# Patient Record
Sex: Female | Born: 1940
Health system: Southern US, Community
[De-identification: ages and names within clinical notes are randomized; demographics above are authoritative.]

## PROBLEM LIST (undated history)

## (undated) DIAGNOSIS — R4189 Other symptoms and signs involving cognitive functions and awareness: Secondary | ICD-10-CM

## (undated) DIAGNOSIS — F419 Anxiety disorder, unspecified: Secondary | ICD-10-CM

## (undated) DIAGNOSIS — Z720 Tobacco use: Secondary | ICD-10-CM

## (undated) DIAGNOSIS — K219 Gastro-esophageal reflux disease without esophagitis: Secondary | ICD-10-CM

## (undated) DIAGNOSIS — E039 Hypothyroidism, unspecified: Secondary | ICD-10-CM

## (undated) DIAGNOSIS — E785 Hyperlipidemia, unspecified: Secondary | ICD-10-CM

## (undated) DIAGNOSIS — I639 Cerebral infarction, unspecified: Secondary | ICD-10-CM

## (undated) DIAGNOSIS — R51 Headache: Secondary | ICD-10-CM

## (undated) DIAGNOSIS — IMO0002 Reserved for concepts with insufficient information to code with codable children: Secondary | ICD-10-CM

## (undated) DIAGNOSIS — M47816 Spondylosis without myelopathy or radiculopathy, lumbar region: Secondary | ICD-10-CM

## (undated) DIAGNOSIS — M810 Age-related osteoporosis without current pathological fracture: Secondary | ICD-10-CM

## (undated) DIAGNOSIS — I10 Essential (primary) hypertension: Secondary | ICD-10-CM

## (undated) DIAGNOSIS — J449 Chronic obstructive pulmonary disease, unspecified: Secondary | ICD-10-CM

## (undated) DIAGNOSIS — K2970 Gastritis, unspecified, without bleeding: Secondary | ICD-10-CM

## (undated) DIAGNOSIS — M797 Fibromyalgia: Secondary | ICD-10-CM

## (undated) HISTORY — PX: KNEE SURGERY: SHX244

## (undated) HISTORY — DX: Headache: R51

## (undated) HISTORY — DX: Age-related osteoporosis without current pathological fracture: M81.0

## (undated) HISTORY — DX: Fibromyalgia: M79.7

## (undated) HISTORY — DX: Hyperlipidemia, unspecified: E78.5

## (undated) HISTORY — DX: Cerebral infarction, unspecified: I63.9

## (undated) HISTORY — DX: Gastritis, unspecified, without bleeding: K29.70

## (undated) HISTORY — DX: Chronic obstructive pulmonary disease, unspecified: J44.9

## (undated) HISTORY — PX: GASTRIC BYPASS: SHX52

## (undated) HISTORY — PX: CHOLECYSTECTOMY: SHX55

---

## 2001-01-10 ENCOUNTER — Encounter: Payer: Self-pay | Admitting: Family Medicine

## 2001-01-10 ENCOUNTER — Ambulatory Visit (HOSPITAL_COMMUNITY): Admission: RE | Admit: 2001-01-10 | Discharge: 2001-01-10 | Payer: Self-pay | Admitting: Family Medicine

## 2001-11-27 ENCOUNTER — Encounter: Payer: Self-pay | Admitting: Orthopaedic Surgery

## 2001-11-27 ENCOUNTER — Ambulatory Visit (HOSPITAL_COMMUNITY): Admission: RE | Admit: 2001-11-27 | Discharge: 2001-11-27 | Payer: Self-pay | Admitting: Orthopaedic Surgery

## 2001-12-04 ENCOUNTER — Ambulatory Visit (HOSPITAL_COMMUNITY): Admission: RE | Admit: 2001-12-04 | Discharge: 2001-12-04 | Payer: Self-pay | Admitting: Orthopaedic Surgery

## 2002-01-20 ENCOUNTER — Ambulatory Visit (HOSPITAL_COMMUNITY): Admission: RE | Admit: 2002-01-20 | Discharge: 2002-01-20 | Payer: Self-pay | Admitting: Internal Medicine

## 2004-01-28 ENCOUNTER — Ambulatory Visit (HOSPITAL_COMMUNITY): Admission: RE | Admit: 2004-01-28 | Discharge: 2004-01-28 | Payer: Self-pay | Admitting: Family Medicine

## 2004-02-25 ENCOUNTER — Ambulatory Visit (HOSPITAL_COMMUNITY): Admission: RE | Admit: 2004-02-25 | Discharge: 2004-02-25 | Payer: Self-pay | Admitting: Family Medicine

## 2004-05-14 HISTORY — PX: COLONOSCOPY WITH ESOPHAGOGASTRODUODENOSCOPY (EGD): SHX5779

## 2004-09-01 ENCOUNTER — Ambulatory Visit (HOSPITAL_COMMUNITY): Admission: RE | Admit: 2004-09-01 | Discharge: 2004-09-01 | Payer: Self-pay | Admitting: Family Medicine

## 2004-09-15 ENCOUNTER — Ambulatory Visit (HOSPITAL_COMMUNITY): Admission: RE | Admit: 2004-09-15 | Discharge: 2004-09-15 | Payer: Self-pay | Admitting: Family Medicine

## 2004-10-18 ENCOUNTER — Ambulatory Visit: Payer: Self-pay | Admitting: Internal Medicine

## 2004-11-16 ENCOUNTER — Ambulatory Visit: Payer: Self-pay | Admitting: Internal Medicine

## 2004-11-16 ENCOUNTER — Ambulatory Visit (HOSPITAL_COMMUNITY): Admission: RE | Admit: 2004-11-16 | Discharge: 2004-11-16 | Payer: Self-pay | Admitting: Internal Medicine

## 2005-01-29 ENCOUNTER — Ambulatory Visit (HOSPITAL_COMMUNITY): Admission: RE | Admit: 2005-01-29 | Discharge: 2005-01-29 | Payer: Self-pay | Admitting: Family Medicine

## 2005-11-23 ENCOUNTER — Emergency Department (HOSPITAL_COMMUNITY): Admission: EM | Admit: 2005-11-23 | Discharge: 2005-11-23 | Payer: Self-pay | Admitting: Emergency Medicine

## 2006-01-04 ENCOUNTER — Ambulatory Visit (HOSPITAL_COMMUNITY): Admission: RE | Admit: 2006-01-04 | Discharge: 2006-01-04 | Payer: Self-pay | Admitting: Orthopaedic Surgery

## 2006-01-18 ENCOUNTER — Ambulatory Visit (HOSPITAL_COMMUNITY): Admission: RE | Admit: 2006-01-18 | Discharge: 2006-01-18 | Payer: Self-pay | Admitting: Family Medicine

## 2006-11-18 ENCOUNTER — Ambulatory Visit (HOSPITAL_COMMUNITY): Admission: RE | Admit: 2006-11-18 | Discharge: 2006-11-18 | Payer: Self-pay | Admitting: Family Medicine

## 2007-10-17 ENCOUNTER — Ambulatory Visit (HOSPITAL_COMMUNITY): Admission: RE | Admit: 2007-10-17 | Discharge: 2007-10-17 | Payer: Self-pay | Admitting: Family Medicine

## 2007-10-21 ENCOUNTER — Ambulatory Visit (HOSPITAL_COMMUNITY): Admission: RE | Admit: 2007-10-21 | Discharge: 2007-10-21 | Payer: Self-pay | Admitting: Family Medicine

## 2007-11-11 ENCOUNTER — Emergency Department (HOSPITAL_COMMUNITY): Admission: EM | Admit: 2007-11-11 | Discharge: 2007-11-11 | Payer: Self-pay | Admitting: Emergency Medicine

## 2007-11-17 ENCOUNTER — Observation Stay (HOSPITAL_COMMUNITY): Admission: AD | Admit: 2007-11-17 | Discharge: 2007-11-19 | Payer: Self-pay | Admitting: Family Medicine

## 2008-04-23 ENCOUNTER — Ambulatory Visit (HOSPITAL_COMMUNITY): Admission: RE | Admit: 2008-04-23 | Discharge: 2008-04-23 | Payer: Self-pay | Admitting: Orthopedic Surgery

## 2008-04-29 ENCOUNTER — Inpatient Hospital Stay (HOSPITAL_COMMUNITY): Admission: EM | Admit: 2008-04-29 | Discharge: 2008-05-04 | Payer: Self-pay | Admitting: *Deleted

## 2008-10-28 ENCOUNTER — Inpatient Hospital Stay (HOSPITAL_COMMUNITY): Admission: RE | Admit: 2008-10-28 | Discharge: 2008-10-31 | Payer: Self-pay | Admitting: Orthopedic Surgery

## 2010-08-21 LAB — CBC
HCT: 27 % — ABNORMAL LOW (ref 36.0–46.0)
HCT: 29.3 % — ABNORMAL LOW (ref 36.0–46.0)
HCT: 35.3 % — ABNORMAL LOW (ref 36.0–46.0)
Hemoglobin: 11.6 g/dL — ABNORMAL LOW (ref 12.0–15.0)
MCHC: 33.4 g/dL (ref 30.0–36.0)
MCHC: 34.1 g/dL (ref 30.0–36.0)
MCHC: 32.9 g/dL (ref 30.0–36.0)
MCV: 94.5 fL (ref 78.0–100.0)
MCV: 95.4 fL (ref 78.0–100.0)
MCV: 96.6 fL (ref 78.0–100.0)
Platelets: 207 10*3/uL (ref 150–400)
RDW: 14.7 % (ref 11.5–15.5)
RDW: 15.8 % — ABNORMAL HIGH (ref 11.5–15.5)
RDW: 15.2 % (ref 11.5–15.5)
WBC: 9.3 10*3/uL (ref 4.0–10.5)

## 2010-08-21 LAB — CROSSMATCH: ABO/RH(D): A POS

## 2010-08-21 LAB — BASIC METABOLIC PANEL
BUN: 6 mg/dL (ref 6–23)
CO2: 26 mEq/L (ref 19–32)
CO2: 29 mEq/L (ref 19–32)
Calcium: 7.8 mg/dL — ABNORMAL LOW (ref 8.4–10.5)
Chloride: 107 mEq/L (ref 96–112)
Creatinine, Ser: 0.63 mg/dL (ref 0.4–1.2)
GFR calc Af Amer: >60 mL/min (ref >60)
GFR calc non Af Amer: >60 mL/min (ref >60)
GFR calc non Af Amer: >60 mL/min (ref >60)
Potassium: 4.4 mEq/L (ref 3.5–5.1)
Sodium: 134 mEq/L — ABNORMAL LOW (ref 135–145)

## 2010-08-21 LAB — URINALYSIS, ROUTINE W REFLEX MICROSCOPIC
Glucose, UA: NEGATIVE mg/dL
Ketones, ur: NEGATIVE mg/dL
Specific Gravity, Urine: 1.017 (ref 1.005–1.030)
pH: 5.5 (ref 5.0–8.0)

## 2010-08-21 LAB — COMPREHENSIVE METABOLIC PANEL
AST: 19 U/L (ref 0–37)
Albumin: 3.4 g/dL — ABNORMAL LOW (ref 3.5–5.2)
Chloride: 109 mEq/L (ref 96–112)
Creatinine, Ser: 0.76 mg/dL (ref 0.4–1.2)
GFR calc Af Amer: >60 mL/min (ref >60)
Potassium: 5.2 mEq/L — ABNORMAL HIGH (ref 3.5–5.1)
Total Bilirubin: 0.6 mg/dL (ref 0.3–1.2)

## 2010-08-21 LAB — DIFFERENTIAL
Basophils Absolute: 0.1 10*3/uL (ref 0.0–0.1)
Eosinophils Relative: 6 % — ABNORMAL HIGH (ref 0–5)
Lymphocytes Relative: 18 % (ref 12–46)
Monocytes Absolute: 0.5 10*3/uL (ref 0.1–1.0)

## 2010-08-21 LAB — URINE MICROSCOPIC-ADD ON

## 2010-08-22 ENCOUNTER — Other Ambulatory Visit (HOSPITAL_COMMUNITY): Payer: Self-pay | Admitting: Family Medicine

## 2010-08-22 DIAGNOSIS — Z139 Encounter for screening, unspecified: Secondary | ICD-10-CM

## 2010-08-29 ENCOUNTER — Ambulatory Visit (HOSPITAL_COMMUNITY)
Admission: RE | Admit: 2010-08-29 | Discharge: 2010-08-29 | Disposition: A | Discharge status: Home / Self Care | Payer: Medicare Other | Source: Ambulatory Visit | Attending: Family Medicine | Admitting: Family Medicine

## 2010-08-29 ENCOUNTER — Other Ambulatory Visit (HOSPITAL_COMMUNITY): Payer: Self-pay | Admitting: Family Medicine

## 2010-08-29 DIAGNOSIS — R05 Cough: Secondary | ICD-10-CM

## 2010-08-29 DIAGNOSIS — J449 Chronic obstructive pulmonary disease, unspecified: Secondary | ICD-10-CM

## 2010-08-29 DIAGNOSIS — Z139 Encounter for screening, unspecified: Secondary | ICD-10-CM

## 2010-08-29 DIAGNOSIS — Z1231 Encounter for screening mammogram for malignant neoplasm of breast: Secondary | ICD-10-CM | POA: Insufficient documentation

## 2010-08-29 DIAGNOSIS — R059 Cough, unspecified: Secondary | ICD-10-CM

## 2010-09-23 ENCOUNTER — Inpatient Hospital Stay (HOSPITAL_COMMUNITY): Payer: Medicare Other

## 2010-09-23 ENCOUNTER — Inpatient Hospital Stay (HOSPITAL_COMMUNITY)
Admission: AD | Admit: 2010-09-23 | Discharge: 2010-09-27 | DRG: 372 | Disposition: A | Discharge status: Home / Self Care | Payer: Medicare Other | Source: Ambulatory Visit | Attending: Internal Medicine | Admitting: Internal Medicine

## 2010-09-23 DIAGNOSIS — F411 Generalized anxiety disorder: Secondary | ICD-10-CM | POA: Diagnosis present

## 2010-09-23 DIAGNOSIS — A059 Bacterial foodborne intoxication, unspecified: Principal | ICD-10-CM | POA: Diagnosis present

## 2010-09-23 DIAGNOSIS — E871 Hypo-osmolality and hyponatremia: Secondary | ICD-10-CM | POA: Diagnosis not present

## 2010-09-23 DIAGNOSIS — E039 Hypothyroidism, unspecified: Secondary | ICD-10-CM | POA: Diagnosis present

## 2010-09-23 DIAGNOSIS — D509 Iron deficiency anemia, unspecified: Secondary | ICD-10-CM | POA: Diagnosis present

## 2010-09-23 DIAGNOSIS — IMO0001 Reserved for inherently not codable concepts without codable children: Secondary | ICD-10-CM | POA: Diagnosis present

## 2010-09-23 DIAGNOSIS — E876 Hypokalemia: Secondary | ICD-10-CM | POA: Diagnosis not present

## 2010-09-23 DIAGNOSIS — G894 Chronic pain syndrome: Secondary | ICD-10-CM | POA: Diagnosis present

## 2010-09-23 DIAGNOSIS — K219 Gastro-esophageal reflux disease without esophagitis: Secondary | ICD-10-CM | POA: Diagnosis present

## 2010-09-23 LAB — BASIC METABOLIC PANEL
Calcium: 8.8 mg/dL (ref 8.4–10.5)
Creatinine, Ser: 0.94 mg/dL (ref 0.4–1.2)
GFR calc Af Amer: >60 mL/min (ref >60)

## 2010-09-23 MED ORDER — IOHEXOL 300 MG/ML  SOLN: 100.0000 mL | Freq: Once | INTRAMUSCULAR | Status: AC | PRN

## 2010-09-24 LAB — CBC
Hemoglobin: 11 g/dL — ABNORMAL LOW (ref 12.0–15.0)
MCHC: 33.1 g/dL (ref 30.0–36.0)
Platelets: 299 10*3/uL (ref 150–400)
RBC: 3.56 MIL/uL — ABNORMAL LOW (ref 3.87–5.11)

## 2010-09-24 LAB — DIFFERENTIAL
Basophils Absolute: 0 10*3/uL (ref 0.0–0.1)
Basophils Relative: 0 % (ref 0–1)
Eosinophils Absolute: 0.1 10*3/uL (ref 0.0–0.7)
Neutro Abs: 7.2 10*3/uL (ref 1.7–7.7)
Neutrophils Relative %: 69 % (ref 43–77)

## 2010-09-24 LAB — COMPREHENSIVE METABOLIC PANEL
ALT: 17 U/L (ref 0–35)
AST: 35 U/L (ref 0–37)
Albumin: 2.5 g/dL — ABNORMAL LOW (ref 3.5–5.2)
CO2: 23 mEq/L (ref 19–32)
Calcium: 7.6 mg/dL — ABNORMAL LOW (ref 8.4–10.5)
Chloride: 103 mEq/L (ref 96–112)
GFR calc Af Amer: >60 mL/min (ref >60)
GFR calc non Af Amer: >60 mL/min (ref >60)
Sodium: 132 mEq/L — ABNORMAL LOW (ref 135–145)

## 2010-09-24 LAB — URINALYSIS, ROUTINE W REFLEX MICROSCOPIC
Glucose, UA: NEGATIVE mg/dL
Ketones, ur: NEGATIVE mg/dL
Nitrite: NEGATIVE
Specific Gravity, Urine: 1.01 (ref 1.005–1.030)
pH: 6 (ref 5.0–8.0)

## 2010-09-24 LAB — CLOSTRIDIUM DIFFICILE BY PCR: Toxigenic C. Difficile by PCR: NEGATIVE

## 2010-09-24 LAB — T4, FREE: Free T4: 1.02 ng/dL (ref 0.80–1.80)

## 2010-09-24 LAB — TSH: TSH: 0.792 u[IU]/mL (ref 0.350–4.500)

## 2010-09-24 MED ORDER — IOHEXOL 300 MG/ML  SOLN
100.0000 mL | Freq: Once | INTRAMUSCULAR | Status: AC | PRN
  Administered 2010-09-24: 100 mL via INTRAVENOUS

## 2010-09-25 LAB — BASIC METABOLIC PANEL
CO2: 24 mEq/L (ref 19–32)
Chloride: 109 mEq/L (ref 96–112)
Creatinine, Ser: <0.47 mg/dL (ref 0.4–1.2)
Potassium: 3.5 mEq/L (ref 3.5–5.1)
Sodium: 137 mEq/L (ref 135–145)

## 2010-09-25 LAB — DIFFERENTIAL
Basophils Absolute: 0 10*3/uL (ref 0.0–0.1)
Basophils Relative: 1 % (ref 0–1)
Eosinophils Absolute: 0.3 10*3/uL (ref 0.0–0.7)
Monocytes Relative: 12 % (ref 3–12)
Neutro Abs: 3.4 10*3/uL (ref 1.7–7.7)
Neutrophils Relative %: 54 % (ref 43–77)

## 2010-09-25 LAB — CBC
Hemoglobin: 10.7 g/dL — ABNORMAL LOW (ref 12.0–15.0)
MCH: 31.7 pg (ref 26.0–34.0)
Platelets: 230 10*3/uL (ref 150–400)
RBC: 3.38 MIL/uL — ABNORMAL LOW (ref 3.87–5.11)
WBC: 6.3 10*3/uL (ref 4.0–10.5)

## 2010-09-25 LAB — VITAMIN B12: Vitamin B-12: 616 pg/mL (ref 211–911)

## 2010-09-25 LAB — IRON: Iron: 33 ug/dL — ABNORMAL LOW (ref 42–135)

## 2010-09-25 LAB — FERRITIN: Ferritin: 48 ng/mL (ref 10–291)

## 2010-09-26 LAB — BASIC METABOLIC PANEL
BUN: <3 mg/dL — ABNORMAL LOW (ref 6–23)
Calcium: 8.3 mg/dL — ABNORMAL LOW (ref 8.4–10.5)
Creatinine, Ser: 0.48 mg/dL (ref 0.4–1.2)
GFR calc non Af Amer: >60 mL/min (ref >60)
Glucose, Bld: 88 mg/dL (ref 70–99)

## 2010-09-26 NOTE — H&P (Signed)
NAMEDIAMONIQUE, [name]            ACCOUNT NO.:  [id number]   MEDICAL RECORD NO.:  [id number]          PATIENT TYPE:  INP   LOCATION:  A332                          FACILITY:  [facility]   PHYSICIAN:  Donna Bernard, M.D.DATE OF BIRTH:  Aug 26, 1940   DATE OF ADMISSION:  11/17/2007  DATE OF DISCHARGE:  LH                              HISTORY & PHYSICAL   CHIEF COMPLAINT:  Cough, weakness, and light headedness.   SUBJECTIVE:  This patient is a 70 year old white female with history of  hypertension, hypothyroidism, hyperlipidemia, COPD, and arthritis who  presented to the office on day of admission with acute concerns.  The  patient was seen in the emergency room approximately a week before with  sudden onset of severe headache.  This occurred throughout the entire  head.  It was also accompanied over the next several days with  significant cough, congestion, chills, and fever.  The patient also had  vomiting and diarrhea.  She was seen here and given a prescription for  Levaquin, which she has been taking.  She has continued to have  diarrhea, and if anything, it has gotten worse.  Her cough has also  worsened.  She will have spells where she throws up with coughing, so  she has been having difficulty keeping anything down.  The patient notes  intermittent fever.  She had an x-ray in the ER, which showed no acute  changes.  The patient states generally her cough has been nonproductive.   MEDICATIONS:  She claims fair compliance with her chronic meds, which  include:  1. Paxil 40 mg daily.  2. Levothroid 0.125 mg daily.  3. Nortriptyline 50 mg p.o. nightly.  4. Lisinopril 10 mg p.o. q.a.m.  5. Ranitidine p.r.n.  6. Albuterol p.r.n.  7. Xanax 0.5 mg b.i.d. p.r.n. for anxiety.   ALLERGIES:  Macrobid and Neurontin.   FAMILY HISTORY:  Positive for hypertension, coronary artery disease, and  hyperlipidemia.   PRIOR SURGERIES:  1. Remote gastric bypass.  2. Remote cholecystectomy.  3.  Remote hysterectomy.  4. Oophorectomy.   SOCIAL HISTORY:  The patient is married.  Unfortunately, still smokes  despite having attempted to quit multiple times in the past.  Alcohol  intake none, 3 children.   REVIEW OF SYSTEMS:  Otherwise, negative.   PHYSICAL EXAMINATION:  VITAL SIGNS:  Currently, afebrile, blood pressure  130/78.  HEENT:  Mild nasal congestion.  TMs normal, no discharge.  Pharynx dry.  NECK:  Supple.  LUNGS:  Diminished breath sounds diffusely, rare wheeze.  HEART:  Slight tachycardic.  Positive for murmur.  ABDOMEN:  Significantly tender in the epigastrium.  Bowel sounds  hyperactive.  Mild left lower quadrant tenderness.  EXTREMITIES:  No  significant edema.  No focal neurological deficits.   Chest x-rays, plate atelectasis.  CBC, Met 7 pending.  It should be  noted blood pressure was 98/60 while lying down and 82/50 while sitting  up.   IMPRESSION:  1. Severe orthostatic hypotension and volume contraction.  2. Gastroenteritis.  3. Gastritis.  4. Possible sinusitis and bronchitis.  5. Post flu syndrome.  6.  Hypertension.  7. Chronic obstructive pulmonary disease.  8. Hyperlipidemia.   PLAN:  Hydrate, test further, further orders as noted in the chart.      Donna Bernard, M.D.  Electronically Signed     WSL/MEDQ  D:  11/17/2007  T:  11/18/2007  Job:  [id number]

## 2010-09-26 NOTE — Discharge Summary (Signed)
NAMEITA, FRITZSCHE            ACCOUNT NO.:  [id number]   MEDICAL RECORD NO.:  [id number]          PATIENT TYPE:  INP   LOCATION:  1615                         FACILITY:  [facility]   PHYSICIAN:  John L. Rendall, M.D.  DATE OF BIRTH:  1941-01-18   DATE OF ADMISSION:  04/29/2008  DATE OF DISCHARGE:  05/03/2008                               DISCHARGE SUMMARY   ADDENDUM   HOSPITAL COURSE:  Ms. Cottone was ready for transfer to a facility on  Friday, December 18, but a bed was not available, so she remained in the  hospital.  She had nonweightbearing of both left upper and lower  extremity with strict elevation.  She was started on Arixtra for DVT  prophylaxis.  Her pain was controlled with p.o. pain meds and it is felt  at this time she is ready for transfer to the skilled facility.      Legrand Pitts Duffy, P.A.      John L. Rendall, M.D.  Electronically Signed    KED/MEDQ  D:  05/03/2008  T:  05/03/2008  Job:  [id number]

## 2010-09-26 NOTE — Discharge Summary (Signed)
[name], [name]            ACCOUNT NO.:  [id number]   MEDICAL RECORD NO.:  [id number]          PATIENT TYPE:  OBV   LOCATION:  A322                          FACILITY:  [facility]   PHYSICIAN:  Scott A. Gerda Diss, MD    DATE OF BIRTH:  11/18/1940   DATE OF ADMISSION:  11/17/2007  DATE OF DISCHARGE:  LH                               DISCHARGE SUMMARY   DISCHARGE DIAGNOSES:  1. Sinusitis.  2. Gastroenteritis with orthostatic hypotension.  3. Diarrhea possibly antibiotic induced.   HOSPITAL COURSE:  A 70 year old female admitted in observation secondary  to abdominal pain, nausea, diarrhea, orthostasis, cough and congestion  who recently has been treated with Levaquin, still bringing up  discomfort and denies fever and chills.  She has had some vomiting and  diarrhea.  Because of the orthostasis, she was admitted for IV fluids,  IV Rocephin, and she gradually improved over the course in the next 36  hours, had a couple loose stools that were C-difficile toxin negative,  but the patient does relate that she had mucous in her stools over the  past few days before coming into the hospital.  After discussion with  the patient on the morning November 19, 2007, it is felt that she was  tolerating food well, able to get up, not having dizziness anymore,  urinating fine, and her labs overall looked good.  Her potassium had  come back up.  Her white count looks good.  It is felt that the patient  was stable for discharge.  She will be discharged home on a Z-PAK for  the sinus, but she will also be on Flagyl 500 t.i.d. by mouth to cover  for any possibility of C-difficile.  Also, lactobacilli over-the-counter  at pharmacy, take with meals and follow up with Dr. Brett Canales in a week's  time to see how things are going, may resume usual meds.  Should be  noted that thyroid was checked while she was in the hospital and TSH was  in the mid 3 range, should also be noted that she did have slight  hypokalemia  when she first came in but the potassium came up to 3.9 with  supplementation plus also be noted that she did have a slight anemia,  11.2 hemoglobin with 100.7 MCV.      Scott A. Gerda Diss, MD  Electronically Signed     SAL/MEDQ  D:  11/19/2007  T:  11/19/2007  Job:  [id number]   cc:   Donna Bernard, M.D.  Fax: [phone]

## 2010-09-26 NOTE — Discharge Summary (Signed)
NAMEZEYNAB, KLETT            ACCOUNT NO.:  [id number]   MEDICAL RECORD NO.:  [id number]          PATIENT TYPE:  INP   LOCATION:  1615                         [facility]ACILITY:  [facility]   PHYSICIAN:  John L. Rendall, M.D.  DATE OF BIRTH:  January 04, 1941   DATE OF ADMISSION:  04/29/2008  DATE OF DISCHARGE:  04/30/2008                               DISCHARGE SUMMARY   ADMISSION DIAGNOSES:  1. Nondisplaced left tib-fib fracture.  2. Impacted left distal radius fracture.  3. Osteoarthritis, left knee.  4. Depression.  5. Hypertension.  6. Fibromyalgia.  7. Chronic obstructive pulmonary disease/bronchitis.  8. Anxiety.  9. Hypothyroidism.  10.Gastroesophageal reflux disease.   DISCHARGE DIAGNOSES:  1. Nondisplaced left tib-fib fracture.  2. Impacted left distal radius fracture.  3. Osteoarthritis, left knee.  4. Depression.  5. Hypertension.  6. Fibromyalgia.  7. Chronic obstructive pulmonary disease/bronchitis.  8. Anxiety.  9. Hypothyroidism.  10.Gastroesophageal reflux disease.   1. Acute blood loss anemia secondary to fractures.   SURGICAL PROCEDURES:  None.   CONSULTANT:  Case management physical therapy consult April 29, 2008.   HISTORY OF PRESENT ILLNESS:  A 70 year old white female patient  presented to Dr. Priscille Kluver with a history of left knee arthritis.  She was  actually scheduled for admission on the morning of the 17th for a left  knee replacement, but while going to the bathroom the night before  surgery slipped on a wet spot on the floor and fell in the bathroom.  She was brought to the ER where she was found to have a nondisplaced  left tib-fib fracture and an impacted left distal radius fracture.  Because of this, her surgery was cancelled and she was admitted for the  need for skilled placement and pain control.   HOSPITAL COURSE:  Ms. Reaves was admitted via the ER.  She was placed  in a sugar-tong splint on the left wrist and a long-leg splint on the  left  leg.  She is to be nonweightbearing on both extremities with just  bed-to-chair transfers.  She was transferred to the orthopedic floor.  She had difficulty with swelling of the left arm and was placed in a  mission type splint, but she remained stable through the night.  It is  felt she is ready for transfer to a skilled facility when a bed is  available.   DISCHARGE INSTRUCTIONS:  Diet:  She can resume her regular diet.   MEDICATIONS:  She may resume her home meds.  These include  1. Diclofenac, she is to hold at this time.  2. Alprazolam 0.5 mg 1/2 tablet p.o. q.a.m. and 1-1/2 tablets p.o.      q.h.s.  3. Nortriptyline 25 mg 2 tablets p.o. q.h.s.  4. Pantoprazole 40 mg p.o. q.a.m.  5. Vicodin on hold at this time.  6. Ranitidine 300 mg p.o. q.a.m.  7. Levoxyl 125 mcg p.o. q.a.m.  8. Paroxetine 20 mg 2 tablets p.o. q.a.m.  9. Lisinopril 10 mg p.o. q.a.m.   Additional meds at this time include:  1. Percocet 5/325 mg 1-2 tablets p.o. q.4h. p.r.n. for pain.  2. Robaxin 500 mg 1-2 tablets p.o. q.6h. p.r.n. for spasms.  3. Arixtra 2.5 mg subcu q. 8 a.m. for 2 weeks.   ACTIVITY:  She needs to be nonweightbearing in the left arm in the left  leg, and she can be out of bed transferring bed to chair. They can  attempt ambulation but has to be completely nonweightbearing on the left  leg.  Left leg needs to be elevated above the heart as much as possible  for the next 48-72 hours. Ice pack.  Elevate the left wrist above the  heart as much as possible for the next 48-72 hours.   Wound care:  Please keep the splint clean and dry.  Apply ice pack to  the mid tibia and the wrist for the next 48-72 hours.  Please notify Dr.  Priscille Kluver of temperature greater than 101.5, chills, pain unrelieved by  pain meds or foul-smelling drainage from the wound.   FOLLOW-UP:  She needs to follow up with Dr. Priscille Kluver in our office in  [place] in approximately 1 week.  She needs to call [phone] to set up that   appointment, and that can be for the Monday after Christmas.   LABORATORY DATA:  X-ray taken of the left wrist on December 17 showed  impacted distal radius fracture.  X-rays taken of the left knee at that  time showed moderate joint space narrowing with osteophytic spurring and  bony eburnation with a transverse fracture through the upper tibial  shaft and a slightly impacted transverse fracture through the fibular  neck, small knee joint effusion.  Tib-fib film at that time showed tib-  fib fractures.   Hemoglobin and hematocrit ranged from 13.1 and 39.7 on the 11th to 11.5  and 34.5 on the 18th.  Glucose went from 77 on the 11th to 110 on the  18th.  Albumin was low at 3.4 on the 11th.   Urinalysis on December 11 showed trace leukocyte esterase, negative  nitrates, 0-2 white cells, negative protein.  All other indices within  normal limits.  All other laboratory studies were within normal limits.      Legrand Pitts Duffy, P.A.      John L. Rendall, M.D.  Electronically Signed    KED/MEDQ  D:  04/30/2008  T:  04/30/2008  Job:  [id number]

## 2010-09-26 NOTE — H&P (Signed)
NAME:  [name]            ACCOUNT NO.:  11/17/2007   MEDICAL RECORD NO.:  [id number]           PATIENT TYPE:   LOCATION:                                 FACILITY:   PHYSICIAN:  Donna Bernard, M.D.     DATE OF BIRTH:   DATE OF ADMISSION:  11/17/2007  DATE OF DISCHARGE:  LH                              HISTORY & PHYSICAL   CHIEF COMPLAINT:  Cough, weakness, and light headedness.   SUBJECTIVE:  This patient is a 70 year old white female with history of  hypertension, hypothyroidism, hyperlipidemia, COPD, and arthritis who  presented to the office on day of admission with acute concerns.  The  patient was seen in the emergency room approximately a week before with  sudden onset of severe headache.  This occurred throughout the entire  head.  It was also accompanied over the next several days with  significant cough, congestion, chills, and fever.  The patient also had  vomiting and diarrhea.  She was seen here and given a prescription for  Levaquin, which she has been taking.  She has continued to have  diarrhea, and if anything, it has gotten worse.  Her cough has also  worsened.  She will have spells where she throws up with coughing, so  she has been having difficulty keeping anything down.  The patient notes  intermittent fever.  She had an x-ray in the ER, which showed no acute  changes.  The patient states generally her cough has been nonproductive.   MEDICATIONS:  She claims fair compliance with her chronic meds, which  include:  1. Paxil 40 mg daily.  2. Levothroid 0.125 mg daily.  3. Nortriptyline 50 mg p.o. nightly.  4. Lisinopril 10 mg p.o. q.a.m.  5. Ranitidine p.r.n.  6. Albuterol p.r.n.  7. Xanax 0.5 mg b.i.d. p.r.n. for anxiety.   ALLERGIES:  Macrobid and Neurontin.   FAMILY HISTORY:  Positive for hypertension, coronary artery disease, and  hyperlipidemia.   PRIOR SURGERIES:  1. Remote gastric bypass.  2. Remote cholecystectomy.  3. Remote  hysterectomy.  4. Oophorectomy.   SOCIAL HISTORY:  The patient is married.  Unfortunately, still smokes  despite having attempted to quit multiple times in the past.  Alcohol  intake none, 3 children.   REVIEW OF SYSTEMS:  Otherwise, negative.   PHYSICAL EXAMINATION:  VITAL SIGNS:  Currently, afebrile, blood pressure  130/78.  HEENT:  Mild nasal congestion.  TMs normal, no discharge.  Pharynx dry.  NECK:  Supple.  LUNGS:  Diminished breath sounds diffusely, rare wheeze.  HEART:  Slight tachycardic.  Positive for murmur.  ABDOMEN:  Significantly tender in the epigastrium.  Bowel sounds  hyperactive.  Mild left lower quadrant tenderness.  EXTREMITIES:  No  significant edema.  No focal neurological deficits.   Chest x-rays, plate atelectasis.  CBC, Met 7 pending.  It should be  noted blood pressure was 98/60 while lying down and 82/50 while sitting  up.   IMPRESSION:  1. Severe orthostatic hypotension and volume contraction.  2. Gastroenteritis.  3. Gastritis.  4. Possible sinusitis and bronchitis.  5.  Post flu syndrome.  6. Hypertension.  7. Chronic obstructive pulmonary disease.  8. Hyperlipidemia.   PLAN:  Hydrate, test further, further orders as noted in the chart.      Donna Bernard, M.D.     Karie Chimera  D:  11/17/2007  T:  11/18/2007  Job:  [id number]

## 2010-09-26 NOTE — Op Note (Signed)
[name], [name]            ACCOUNT NO.:  [id number]   MEDICAL RECORD NO.:  [id number]          PATIENT TYPE:  INP   LOCATION:  0002                         FACILITY:  [facility]   PHYSICIAN:  John L. Rendall, M.D.  DATE OF BIRTH:  February 07, 1941   DATE OF PROCEDURE:  10/28/2008  DATE OF DISCHARGE:                               OPERATIVE REPORT   PREOPERATIVE DIAGNOSIS:  Osteoarthritis, left knee with 41 degrees  valgus.   SURGICAL PROCEDURES:  Left total knee replacement using the TK3 Sigma  system.   POSTOPERATIVE DIAGNOSIS:  Osteoarthritis, left knee with 41 degrees  valgus.   SURGEON:  John L. Rendall, M.D.   ASSISTANT:  [name]. [name], PAC present and participating in the entire  procedure start to finish.   ANESTHESIA:  General with femoral nerve block.   PATHOLOGY:  The patient has two unusual features.  She has a 41 degree  measured valgus deformity when she stands on the leg.  She has a 20-month-  old fracture of the tibial metaphysis such that a tibial stem is felt  necessary to be placed through the fracture to good bone below.   PROCEDURE:  Under general anesthesia with femoral nerve block, the left  leg was prepared with DuraPrep, draped as a sterile field wrapped out  with an Esmarch and tourniquet used at 350 mm.  Midline incision is  made.  The patella was everted.  The femur is estimated to be between a  2.5 and a 3 for size.  A clean-up cut was made on the proximal tibia.  A  central hole was placed in the proximal tibia and the canal finder was  used.  It hits on what was felt to be hard structure and the worry is  whether we were going up the side of the tibia distally.  At this point  the C-arm was called for as the canal finder was essentially hanging up  on the healed fracture.  While we were waiting for the C-arm, a central  hole was placed in the femur and canal reaming was done up to 20 mm.  Distal femoral cut was made and anterior and posterior  femoral cuts were  made for a 2.5 after this measured by the guide.  At this point, the C-  arm was present.  It confirms that the canal finder was not out the  cortex of the tibia but is actually centered in the middle of the callus  from the healed fracture.  It was hand reamed through this callus to get  a 140 stem through and this was centralized on AP and lateral views.  At  this point, the tibia was prepared and 14 and 16 reamers seemed to be  the best fit for best centralization of the proximal tibia.  The 14  reamer is optimal.  At this time the remainder of the cuts were made on  the femur.  Box cut for the TC3 and at this point, trial reduction is  then done with the 14 stem, 140 mm long in the tibia and the 20 stem in  the femur.  A great deal of time was taken to match the flexion and  extension gap at 10 mm.  Several extra cuts were made on the femur to  balance this right as well as a great deal of effort was spent releasing  laterally by pie crusting of the IT band and of the fibular collateral  ligaments.  With that pie crusting and done and the lateral structures  stretched, a 10 bearing fit nicely.  On AP flexion and extension, the  whole components put together fit very well.  The patella was then  measured and cut for an 8 mm button.  This was then appropriately  drilled.  While the components were being assembled on the back table,  the knee was washed out with pressure washing and the cement was then  mixed.  Permanent components were then installed trying not to go all  the way the tip of the prosthesis but to go from the metaphysis under  the tibial tray and the femoral tray.  It should also be noted the most  unusual feature of her distal femur in stripping scarred periosteum over  the front of the femur, a Cobb elevator without any unusual pressure  made a defect in the anterior cortex of the femur.  This area was filled  at the end of cementing with bone cement.   Once cement was hardened, the  tourniquet was let down at an  hour and 43 minutes.  Multiple small vessels were cauterized.  The knee  was closed in layers with #1 Tycron, 0 Vicryl, 2-0 Vicryl and skin  clips.  The patient had an excellent correction at the end of the case.  She returned to recovery in good condition.      John L. Rendall, M.D.  Electronically Signed     JLR/MEDQ  D:  10/28/2008  T:  10/28/2008  Job:  [id number]

## 2010-09-27 LAB — BASIC METABOLIC PANEL
BUN: 3 mg/dL — ABNORMAL LOW (ref 6–23)
Calcium: 8.9 mg/dL (ref 8.4–10.5)
Creatinine, Ser: 0.51 mg/dL (ref 0.4–1.2)
GFR calc non Af Amer: >60 mL/min (ref >60)
Glucose, Bld: 91 mg/dL (ref 70–99)

## 2010-09-27 LAB — STOOL CULTURE

## 2010-09-28 NOTE — Discharge Summary (Signed)
[name], [name]            ACCOUNT NO.:  [id number]  MEDICAL RECORD NO.:  [id number]           PATIENT TYPE:  I  LOCATION:  A215                          FACILITY:  [facility]  PHYSICIAN:  Elliot Cousin, M.D.    DATE OF BIRTH:  1940/05/22  DATE OF ADMISSION:  09/23/2010 DATE OF DISCHARGE:  05/16/2012LH                              DISCHARGE SUMMARY   DISCHARGE DIAGNOSES: 1. Acute enteritis, possibly food-borne.  The patient's stool     culture grew out Aeromonas hydrophilia group. 2. Electrolyte abnormalities including hyponatremia and borderline     hypokalemia. 3. Relative hypotension with a history of hypertension. 4. Hypothyroidism.  The patient's free T4 was within normal limits at     1.02 and her TSH was within normal limits at 0.79. 5. Mild iron deficiency anemia.  The patient's total iron was 33,     ferritin 48, and vitamin B12 616. 6. Chronic right-sided abdominal pain. 7. History of intermittent dysphagia. 8. Status post cholecystectomy in the past. 9. Status post gastric bypass surgery 40 years ago. 10.Status post exploratory laparotomy in the past. 11.Status post hysterectomy in the past. 12.Chronic pain syndrome secondary to fibromyalgia. 13.Chronic anxiety. 14.Gastroesophageal reflux disease.  DISCHARGE MEDICATIONS: 1. Cipro 500 mg b.i.d. for 4 more days. 2. Multivitamin with iron once daily. 3. Flagyl 500 mg 3 times daily for 4 more days. 4. Advil 200 mg every 6 hours as needed for pain. 5. Albuterol inhaler 2 puffs inhaled daily as needed for shortness of     breath. 6. Citalopram 20 mg 2 tablets daily. 7. Hydrocodone 5/500 mg 1 tablet every 6 hours as needed for pain. 8. Levothyroxine 112 mcg daily. 9. Lisinopril 10 mg daily. 10.Nortriptyline 50 mg nightly. 11.Visine eye drop, 1 drop in both eyes daily as needed. 12.Xanax 1 mg half a tablet 3 times daily as needed for anxiety and 1-     1/2 tablets at night as needed for anxiety and  sleep.  DISCHARGE DISPOSITION:  The patient was discharged to home in improved and stable condition on Sep 27, 2010.  She will follow up with her primary care physician, Dr. Gerda Diss, in 1 week on Oct 03, 2010, at 9:40 a.m.  She was advised to call Dr. Patty Sermons office for the followup appointment that had already been scheduled.  CONSULTATIONS:  None.  PROCEDURE PERFORMED:  CT scan of the abdomen and pelvis on Sep 24, 2010, the results revealed thickened small bowel loops in the mid abdomen, pelvis, compatible with enteritis.  Partially fluid-filled colon compatible with the patient's history of diarrhea.  HISTORY OF PRESENT ILLNESS:  The patient is a 70 year old woman with a past medical history significant for hypertension, hypothyroidism, fibromyalgia, and chronic abdominal pain.  She presented to the emergency department on Sep 23, 2010, with a chief complaint of diarrhea.  She had eaten out at a restaurant approximately 24 hours before her symptoms started.  She was in [place] shopping when the diarrhea occurred.  She presented to the [facility]. She was transferred to [facility] per request and admitted for further evaluation and management.  HOSPITAL COURSE:  The patient was started empirically on intravenous Cipro and Flagyl.  Eventually Flagyl and Cipro were changed to the oral preparations.  Gentle IV fluids were initiated.  Symptomatic treatment was started.  For further evaluation a number of studies were ordered. The CT scan of her abdomen and pelvis revealed findings consistent with an acute enteritis.  Stool for Clostridium difficile PCR was negative. Her blood lipase was within normal limits at 11, although apparently it had been elevated at [facility].  Her urinalysis revealed no evidence of infection.  Her stool culture eventually grew out Aeromonas hydrophilia species today.  The patient's diarrhea subsided and then  completely resolved.  She received 4-1/2 days of therapy with Cipro and Flagyl.  She was discharged home on 4 more days of both.  The patient's blood pressure was on the low normal side and therefore lisinopril was initially withheld.  It was eventually restarted when her blood pressures improved.  She was maintained on Synthroid for hypothyroidism.  Both her TSH and free T4 were within normal limits. Her serum sodium was low at 132.  Following IV hydration, it improved to 141 prior to discharge.  Her serum potassium was borderline low at 3.5. It was 3.6 prior to discharge, following repletion orally and in the IV fluids.  Her hemoglobin fell from 11.0 on admission to 10.7 at the time of hospital discharge.  Her ferritin was on the low end of normal, and her total iron was slightly low at 33.  Her vitamin B12 was within normal limits.  The patient was discharged on a multivitamin with iron once daily.  She had been referred to Dr. Karilyn Cota for evaluation of chronic right-sided abdominal pain and dysphagia.  The patient had no major complaints of either during the hospitalization. Apparently, an appointment has been made.  The patient was given Dr. Patty Sermons office number and was advised to call his office when she returns home for the appointment date and time.  She voiced understanding.     Elliot Cousin, M.D.     DF/MEDQ  D:  09/27/2010  T:  09/27/2010  Job:  [id number]  cc:   Lionel December, M.D. Fax: [phone]  Donna Bernard, M.D. Fax: [phone]  Electronically Signed by Elliot Cousin M.D. on 09/28/2010 10:48:58 PM

## 2010-09-29 NOTE — Discharge Summary (Signed)
[name], [name]            ACCOUNT NO.:  [id number]   MEDICAL RECORD NO.:  [id number]          PATIENT TYPE:  INP   LOCATION:  1616                         FACILITY:  [facility]   PHYSICIAN:  John L. Rendall, M.D.  DATE OF BIRTH:  [birth year]   DATE OF ADMISSION:  10/28/2008  DATE OF DISCHARGE:  10/31/2008                               DISCHARGE SUMMARY   ADMISSION DIAGNOSES:  1. Severe osteoarthritis, left knee, status post tibia fracture in      December.  2. Fibromyalgia.  3. Hypertension.  4. Anxiety disorder.  5. Gastroesophageal reflux disease.  6. Hypothyroidism.  7. Chronic obstructive pulmonary disease/bronchitis.  8. Depression.   DISCHARGE DIAGNOSES:  1. Severe osteoarthritis, left knee, status post tibia fracture in      December, now status post left total knee arthroplasty.  2. Acute blood loss anemia, secondary to surgery.  3. Sedation, secondary to medications, now resolved.  4. Hypotension, improved.  5. Fibromyalgia.  6. Hypertension.  7. Anxiety disorder.  8. Gastroesophageal reflux disease.  9. Hypothyroidism.  10.Chronic obstructive pulmonary disease/bronchitis.  11.Depression.   SURGICAL PROCEDURES:  On October 28, 2008, [name] underwent a left  total knee arthroplasty by Dr. Jonny Ruiz L.  Rendall, assisted by Arnoldo Morale  PA-C.  She had a Sigma femoral TC3 cemented size 2.5 left placed with a  Sigma femoral adapter 5-degree, a Universal revision fluted stem, 75 x  20 mm; a second Universal revision stem, 75 x 14 mm, an MBT revision  cemented tray, size 2.5; an oval dome patella 3-peg 35 mm, a Sigma  femoral adapter bolt and a Sigma RPTC3 insert, size 2.5, 10-mm  thickness.   COMPLICATIONS:  None.   CONSULTS:  1. Physical therapy consult, October 29, 2008.  2. Case management/occupational therapy consult, October 30, 2008.   HISTORY OF PRESENT ILLNESS:  This 70 year old white female patient was  scheduled for a left total knee in December of this  past year, but it  was cancelled due to the morning of admission she fell and sustained a  left-wrist and left-tibia fracture.  The fracture is now healed and her  knee is still in severe valgus deformity and causing pain.  At this  point, the pain is constant ache over the medial knee without radiation.  It is occasionally sharp.  It increases with weightbearing and walking  and decreases with [place] gel, diclofenac and Vicodin.  The knee pops,  grinds, swells, keeps her up at night.  She has failed conservative  treatment and has severe valgus deformity and end-stage OA on x-ray.  Because of this, she is presenting for left-knee replacement.   HOSPITAL COURSE:  [name] tolerated her surgical procedure well,  without immediate postoperative complications.  She was transferred to  the orthopedic floor.  Postop day #1 she was a little sedated with the  PCA and that was stopped.  Hemoglobin was 9.2, hematocrit 27, vitals  were stable with the exception of the BP a bit low, so her BP meds were  held.  Leg was neurovascularly intact.  She was started on iron and  therapy per protocol.   Postop day #2, hemoglobin had dropped to 8.2 with hematocrit of 24.7.  With that she was transfused with 2 units packed red blood cells.  Her  BP had improved.  Leg was neurovascularly intact.  Dressing was changed,  Hemovac discontinued and she was continued on therapy.   Postop day #3 she was feeling better.  T-max 97.6, hemoglobin 10,  hematocrit 29.3, incision well-approximated with staples.  No signs of  infection.  She was doing well enough with therapy it was felt she was  ready for DC home and was discharged home later that day.   DISCHARGE INSTRUCTIONS:  Diet:  She can resume her regular  prehospitalization diet.   MEDICATIONS:  She may resume her home meds as follows.  1. Protonix 40 mg p.o. q.a.m.  2. Nortriptyline 25 mg 2 tablets p.o. q.h.s.  3. Alprazolam 0.5 mg one and a half tablet  p.o. q.h.s.  4. Levoxyl 125 mcg p.o. q.a.m.  5. Alendronate 70 mg p.o. q. Sunday.  6. Paroxetine 20 mg 2 tablets p.o. q.a.m.  7. Lisinopril 10 mg p.o. q.a.m.  8. Diclofenac and Vicodin she is to hold at this time.  9. Zantac OTC p.r.n.  10.Osteo BiFlex 2 tablets p.o. q.a.m.  11.Vitamin D 1000 international units p.o. b.i.d.  12.Centrum Silver one p.o. q.a.m.  13.MiraLax 1 teaspoon in 8 ounces of water p.r.n.  14.Albuterol nebs p.r.n.   Additional meds at this time include  1. Arixtra 2.5 mg subcu q. 8 p.m. with the last dose on June 26.  On      the 27th she is to start one baby aspirin daily for 1 month.  2. Celebrex 200 mg 1 tablet p.o. b.i.d. for 1 month, then 1 tablet      daily, 30 with no refill.  3. Percocet 5/325 one to two tablets p.o. q.4 h p.r.n. for pain, 60      with no refill.  4. Robaxin 500 mg one to two  tablets p.o. q.6 h p.r.n. for spasm, 60      with no refill.   ACTIVITY:  She can be out of bed, weightbearing as tolerated on the left  leg with use of a walker.  No lifting or driving for 6 weeks.  She is to  have home CPM 0-90 degrees 6-8 hours a day and home health PT per  [place].  Please see the blue total knee discharge sheet for further  activity instructions.   WOUND CARE:  She may shower after no drainage from the wound for 2 days.  Please see the blue total knee discharge sheet for further wound care  instructions.   FOLLOW-UP:  She is to follow up with Dr. Priscille Kluver in our office in [place]  on Monday, June 28, and needs to call [phone] for that appointment.   LABORATORY DATA:  Hemoglobin/hematocrit ranged from 11.6 and 35.3 on the  11th to 8.2 and 24.7 on the 19th to 10 and 29.3 on the 20th.  White  count and platelets were within normal limits.   Sodium dropped to a low of 134 on the 18th and then went to 139 on the  19th.  Potassium was high at 5.2 on the 11th and then was within normal  limits.  Glucose ranged from 98 on the 11th to 122 on the  18th to 111 on  the 19th.  BUN went to a low of 5 on the 19th.   Calcium  was low at 7.8 on the 18th and then went to 8.0 on the 19th.   Total protein was 5.9 on the 11th, albumin 3.4.  UA on the 11th showed  small leukocyte esterase and few epithelial cells, negative nitrate, 0-2  white and red cells.  All other laboratory studies were within normal  limits.   X-ray taken of the left knee on June 17 showed expected appearance after  a left total knee arthroplasty with remote fractures of proximal tibia  and fibula with both fractures having extensive callus deposition but  persistence the fracture lines.  All other laboratory studies were  within normal limits.      Legrand Pitts Duffy, P.A.      John L. Rendall, M.D.  Electronically Signed    KED/MEDQ  D:  11/10/2008  T:  11/10/2008  Job:  [id number]

## 2010-09-29 NOTE — Op Note (Signed)
[facility]  Patient:    CHRISTIA, DOMKE Visit Number: [id number] MRN: [id number]          Service Type: DSU Location: DAY Attending Physician:  Windle Guard Dictated by:   Darreld Mclean, M.D. Admit Date:  12/04/2001 Discharge Date: 12/04/2001                             Operative Report  PREOPERATIVE DIAGNOSIS:  Tear, medial and lateral meniscus of the left knee.  POSTOPERATIVE DIAGNOSIS:  Tear, medial and lateral meniscus of the left knee.  PROCEDURE:  Operative arthroscopy, partial medial and lateral meniscectomy of the left knee.  ANESTHESIA:  Spinal.  TOURNIQUET TIME:  22 minutes.  SURGEON:  Darreld Mclean, M.D.  ASSISTANT:  Candace Cruise, P.A.-C.  DRAINS:  No drains.  INDICATIONS:  The patient is a 70 year old female with pain and tenderness in her left knee with giving away.  It has gotten progressively worse.  A MRI shows a tear of the anterior horn and body and posterior horn of the lateral meniscus and a tear of the body of the medial meniscus.  The patient had a joint effusion and grade 4 chondromalacia of the lateral femoral condyle and of the lateral tibial plateau. There was a loose body present from a meniscal tear in the knee as well.  Surgery was recommended.  The risks and imponderables were discussed with the patient preoperatively in detail, and she appeared to understand.  DESCRIPTION OF PROCEDURE:  With the patient under spinal anesthesia, she was placed supine on the operating room table with the leg holder and tourniquet deflated on the left upper thigh. She was prepped in the usual manner.  The leg was elevated and wrapped circumferentially with the Esmarch bandage. The tourniquet was inflated to 300 mmHg.  The Esmarch bandage was removed.  The inflow cannula was inserted medially, and lactated Ringers were instilled into the knee by an infusion pump.  The arthroscope was inserted laterally and the knee  systematically examined.  Pictures were taken.  OPERATIVE FINDINGS:  The suprapatellar pouch was normal.  The undersurface of the patella had grade 2 to grade 3 changes, particularly more laterally. Medially, there was a tear in the posterior horn and body of the meniscus. There were grade 2-3 changes of the articular surfaces medially. The anterior cruciate was intact.  Laterally, there was a significant, diffuse tear of the posterior horn, particularly the body of the meniscus, with grade 3 changes of the articular surfaces. There were small fragments of meniscus floating around, particularly laterally.  Gutters were negative.  Attention was directed first to the lateral side using a punch, knife, and meniscal shaver.  This area of torn meniscus was removed, and a good, smooth contour was obtained.  Pictures were taken.  Attention was directed to the medial side, and a similar method was done to trim this area as well.  It was not as extensive medially as it was laterally.  The knee was systematically re-examined.  Lactated Ringers was infused as irrigation.  Then, the wounds were closed using 3-0 nylon interrupted vertical mattress manner.  Marcaine 0.25% was instilled into each portal.  The tourniquet was deflated after 22 minutes.  The patient tolerated the procedure well.  She will be given Vicodin-ES for pain, and I will see her in the office in approximately 10 days to 2 weeks.  Physical therapy has been arranged.  If  there are any difficulties, she should contact us through the office or hospital and be persistent.  Numbers have been provided. Dictated by:   Darreld Mclean, M.D. Attending Physician:  Windle Guard DD:  12/04/01 TD:  12/08/01 Job: [id number]

## 2010-09-29 NOTE — Op Note (Signed)
[name], [name]            ACCOUNT NO.:  [id number]   MEDICAL RECORD NO.:  [id number]          PATIENT TYPE:  AMB   LOCATION:  DAY                           FACILITY:  [facility]   PHYSICIAN:  Lionel December, M.D.    DATE OF BIRTH:  1941/01/08   DATE OF PROCEDURE:  11/16/2004  DATE OF DISCHARGE:                                 OPERATIVE REPORT   PROCEDURE:  Esophagogastroduodenoscopy followed by colonoscopy.   INDICATION:  [name] is a 70 year old Caucasian female who was recently  found to have iron-deficiency anemia by Dr. Lubertha South.  We noted her  stools to be heme-positive.  While she does not have any GI symptoms, she  was noted to have heme-positive stools when she was seen in the office by  [place].  She uses B.C. powder every day.  She has history of gastric bypass  surgery 24 years ago for obesity.   Procedure risks were reviewed the patient; informed consent was obtained.   PREOPERATIVE PREMEDICATION:  1.  Cetacaine spray for pharyngeal topical anesthesia.  2.  Demerol 50 mg IV.  3.  Versed 10 mg IV.   FINDINGS:  Procedures performed in endoscopy suite.  The patient's vital  signs and O2 saturations were monitored during the procedure and remained  stable.   PROCEDURE:  1.  Esophagogastroduodenoscopy.  The patient was placed in left lateral      decubitus position, and Olympus video scope was passed via oropharynx      without any difficulty into esophagus.   Esophagus.  Mucosa of the esophagus normal.  GE junction was at 38 cm.  No  ring or  stricture was noted.   Stomach.  Very small gastric pouch with apparent gastrojejunostomy.  There  was no anastomotic ulcer.  Pouch was small enough that scope could not be  retroflexed.   Small bowel.  Occasional mucosa was examined for at least 30 cm were normal.  There was another very small blind loop of leading from the gastric pouch.  Mucosa was normal.  Endoscope was withdrawn.  The patient prepared for  procedure  #2.   Colonoscopy.  Rectal examination performed.  No abnormality noted external  or digital exam.  Olympus video scope was placed in rectum and advanced  under vision into sigmoid colon beyond.  Very redundant colon but  preparation was satisfactory.  Scope was advanced into the cecum which was  identified by appendiceal orifice and ileocecal valve. Pictures taken for  the record.  Short segment of T I was also examined and was normal. Colonic  mucosa was examined for the second time on the way out, and there were no  polyps, ulceration, or other abnormalities.  Rectal mucosa similarly was  normal.  Scope was retroflexed to examine anorectal junction which was  unremarkable.   The patient tolerated the procedures well.   FINAL DIAGNOSIS:  Normal but surgically altered upper gastrointestinal  tract.  The patient has a very small gastric pouch but no evidence of  anastomotic ulcer.   Redundant but normal colonoscopy.  Normal colon.  Normal terminal ileum.  Suspect iron-deficiency anemia secondary to frequent use of BC powder.  She  may also have an element of impaired iron absorption secondary to gastric  bypass surgery.   RECOMMENDATIONS:  The patient advised not to take [facility] powder daily.  She will  follow with Dr. Lubertha South to see if her fibromyalgia could be managed  with other medicines so she would not have to take ASA daily.  H&H will be  checked today.   If the patient's anemia does not correct with oral iron therapy, will  consider giving capsule study.       NR/MEDQ  D:  11/16/2004  T:  11/16/2004  Job:  [id number]   cc:   Donna Bernard, M.D.  [address]  [place]  [place] [address]  Fax: [phone]

## 2010-09-29 NOTE — Consult Note (Signed)
[name], [name]            ACCOUNT NO.:  [id number]   MEDICAL RECORD NO.:  [id number]          PATIENT TYPE:  AMB   LOCATION:  DAY                           FACILITY:  [facility]   PHYSICIAN:  Lionel December, M.D.    DATE OF BIRTH:  1941/04/24   DATE OF CONSULTATION:  10/18/2004  DATE OF DISCHARGE:                                   CONSULTATION   REASON FOR CONSULTATION:  Iron deficiency anemia.   HISTORY OF PRESENT ILLNESS:  [name] is a 70 year old Caucasian female  who noted extreme fatigue about two months ago.  She was seen by her primary  care Jorgeluis Gurganus and found to be anemic.  Last hemoglobin from Dr. Fletcher Anon  office on September 01, 2004, was 10.6 with a hematocrit of 35.8 and MCV of  81.2.  She was started on ferrous sulfate 325 mg b.i.d.  Her ferritin level  October 16, 2004, was 7.  She has a history of gastric bypass 24 years ago.  She  has returned Hemoccult cards although status of these are unknown.  She  denies any abdominal pain, nausea, vomiting, heartburn, indigestion,  dysphagia or odynophagia.  She denies any melena or rectal bleeding.  She  normally has a bowel movement every day or two.  She does take an occasional  Phillips stool softener since she has started the iron.  She takes BC  powders two times a week.  She denies any early satiety or anorexia.  She  denies any chest pain or palpitations.   PAST MEDICAL HISTORY:  1.  Fibromyalgia.  2.  Exploratory laparotomy with Staphylococcus wound infection.  3.  Gastric bypass 24 years ago.  4.  Appendectomy.  5.  Complete hysterectomy.  6.  Bilateral knee arthroscopy.  7.  Hypertension.   MEDICATIONS:  1.  Ferrous sulfate 325 mg b.i.d.  2.  Lisinopril 10 mg daily.  3.  Zantac 300 mg q.h.s.  4.  Synthroid 0.125 mg daily.  5.  Vicodin 5/500 mg p.r.n.  6.  Paxil 40 mg daily.  7.  Nortriptyline 50 mg q.h.s.  8.  Allegra 180 mg p.r.n.  9.  Dyazide 37.5/25 mg p.r.n.  10. Centrum Silver daily.  11. Zinc 500  mg daily.  12. Osteo Bio Flex one capsule daily.  13. Vitamin E daily.  14. BC powders p.r.n. about twice a week.  15. Phillips stool softener p.r.n.   ALLERGIES:  No known drug allergies.   FAMILY HISTORY:  No known family history of colon carcinoma or lower GI  problems.  Mother, age 42, has lung carcinoma.  Father deceased at age 73  secondary to MI.  She has two healthy brothers.  She has one son who is HIV  positive.  She has one healthy daughter.   SOCIAL HISTORY:  She has been married for 23 years.  [name] is  currently on disability.  She reports a 40-pack-year history of tobacco use  but has recently cut down to about four cigarettes a day. She denies any  alcohol or drug use.   REVIEW OF SYSTEMS:  CONSTITUTIONAL:  She denies any fevers or chills.  See  HPI.  Weight is down about 25 pounds in the last year also she notes being  much more physically active.  CARDIOVASCULAR:  She denies any chest pain or  palpitations.  PULMONARY:  She does have chronic nonproductive cough.  No  hemoptysis or shortness of breath.  GASTROINTESTINAL:  She HPI.  HEMATOLOGIC:  She did have a history of anemia in her younger years with her  menses but none recently until now.  She denies any epistaxis or easy  bruising.   PHYSICAL EXAMINATION:  VITAL SIGNS:  Weight 205 pounds, height 65 inches,  temperature 98.6, blood pressure 132/76, pulse 58.  GENERAL:  [name] is a 70 year old Caucasian female who is oriented,  pleasant and in no acute distress.  HEENT:  Sclerae clear and nonicteric.  Conjunctiva pink.  Oropharynx pink  and moist.  NECK:  Supple without masses thyromegaly.  HEART:  Regular rate and rhythm with normal S1 and S2.  LUNGS:  Lungs are clear to auscultation bilaterally.  ABDOMEN:  Protuberant.  She does have a well-healed midline scar to the  lower abdomen.  Positive bowel sounds x4.  No bruits auscultated.  Abdomen  is soft, nontender, nondistended without palpable  masses or  hepatosplenomegaly.  No tenderness or guarding.  RECTAL:  Anal papilla are noted.  Externally, she has good sphincter tone.  No internal mass palpated.  A small amount of dark brown stool was obtained  which was Hemoccult positive.  EXTREMITIES:  1+ lower extremity edema bilaterally.  SKIN:  Pink, warm and dry without any rash or jaundice.   LABORATORY DATA:  Laboratory studies from Dr. Fletcher Anon office:  She had LFTs  which were normal.  Normal TSH.  Basic metabolic panel was normal except for  a slightly elevated glucose at 109.   IMPRESSION:  [name] is a 70 year old Caucasian female with iron  deficiency anemia/low serum ferritin.  She denies any history of anemia in  the past.  She is 24 years status post gastric bypass.  She is Hemoccult  positive today.  She needs to have colonoscopy to rule out colorectal  carcinoma as well as upper GI, EGD to look for GI bleeding.  Given the fact  that she is status post gastric bypass, she could have problems with iron  absorption too but more than likely may have had symptoms prior to this  time.  We Parsons proceed with colonoscopy and EGD initially.   RECOMMENDATIONS:  1.  Parsons schedule colonoscopy and EGD with Dr. Karilyn Cota.  I have discussed      both procedures including risks and benefits including, but not limited      to, infection, perforation, _________ pain.  2.  She is to hold her iron 7 days prior to colonoscopy.  3.  We Parsons check a CBC the day of procedure.  4.  She can continue ferrous sulfate 325 mg b.i.d. for now.       KC/MEDQ  D:  10/18/2004  T:  10/18/2004  Job:  [id number]   cc:   Donna Bernard, M.D.  [address]  [place]  [place] [address]  Fax: [phone]

## 2010-10-11 ENCOUNTER — Ambulatory Visit (INDEPENDENT_AMBULATORY_CARE_PROVIDER_SITE_OTHER): Payer: Medicare Other | Admitting: Internal Medicine

## 2010-10-11 ENCOUNTER — Other Ambulatory Visit (INDEPENDENT_AMBULATORY_CARE_PROVIDER_SITE_OTHER): Payer: Self-pay | Admitting: Internal Medicine

## 2010-10-11 DIAGNOSIS — R1310 Dysphagia, unspecified: Secondary | ICD-10-CM

## 2010-10-17 ENCOUNTER — Ambulatory Visit (HOSPITAL_COMMUNITY)
Admission: RE | Admit: 2010-10-17 | Discharge: 2010-10-17 | Disposition: A | Discharge status: Home / Self Care | Payer: Medicare Other | Source: Ambulatory Visit | Attending: Internal Medicine | Admitting: Internal Medicine

## 2010-10-17 DIAGNOSIS — R1310 Dysphagia, unspecified: Secondary | ICD-10-CM | POA: Insufficient documentation

## 2010-10-17 DIAGNOSIS — K449 Diaphragmatic hernia without obstruction or gangrene: Secondary | ICD-10-CM | POA: Insufficient documentation

## 2010-10-17 DIAGNOSIS — K224 Dyskinesia of esophagus: Secondary | ICD-10-CM | POA: Insufficient documentation

## 2010-10-19 NOTE — H&P (Signed)
NAMEATHZIRY, MILLICAN NO.:  [id number]  MEDICAL RECORD NO.:  [id number]           PATIENT TYPE:  I  LOCATION:  A215                          FACILITY:  [facility]  PHYSICIAN:  Osvaldo Shipper, MD     DATE OF BIRTH:  1941-05-13  DATE OF ADMISSION:  09/23/2010 DATE OF DISCHARGE:  LH                             HISTORY & PHYSICAL   PRIMARY CARE PHYSICIAN:  Donna Bernard, MD  GASTROENTEROLOGIST:  Anselmo Rod, MD, [facility]  ADMISSION DIAGNOSES: 1. Acute diarrhea, rule out Clostridium difficile. 2. Dehydration. 3. Right-sided abdominal pain, etiology unclear. 4. Dysphagia. 5. History of hypertension and hypothyroidism.  CHIEF COMPLAINT:  Diarrhea.  HISTORY OF PRESENT ILLNESS:  The patient is a 70 year old female who was in her usual state of health till this morning when she went to [place] to do some shopping.  Then, she suddenly felt weak, started having lower abdominal pain, and had to go to the bathroom, and had several episodes of diarrhea.  The diarrhea was described as watery, brownish stool with no blood, no mucus.  The patient had about 6 episodes of diarrhea today. She went subsequently to [facility] as that was the closest ED.  She was evaluated there and then she requested transfer to [facility] for further workup and treatment.  The patient says that she has fever every morning, but denies any fever particularly with this episode.  She has been on antibiotics on and off for the past 4 months for what sounds like bronchitis, the last dose was 3 weeks ago which was Levaquin.  She denies any vaginal discharge.  Denies any nausea, vomiting, no cramping.  The pain in the lower abdomen is constant, 6/10 in intensity, is a dull aching pain.  She denies any heartburn, however, has been having dysphagia for which she is supposed to see Dr. Karilyn Cota soon.  Denies any syncopal episodes, did feel weak.  She had similar episode or diarrhea 2 years  ago.  She tells me that she ate at the [employer] on Thursday as did her husband and he also had a few episodes of diarrhea this morning or last night.  MEDICATIONS AT HOME:  Unfortunately, she does not know the dosage but she is on levothyroxine, Xanax, Zantac, nortriptyline, lisinopril and hydrocodone for chronic pain issues with her fibromyalgia.  ALLERGIES:  She is reportedly allergy to MACROLIDE.  PAST MEDICAL HISTORY:  Positive for hypertension, hypothyroidism, anxiety, fibromyalgia.  She denies any history of diabetes, coronary artery disease, or stroke.  SURGICAL HISTORY:  Includes gastric bypass surgery about 40 years ago, it is unclear what kind of procedure this was.  She has had a cholecystectomy.  She has had exploratory laparotomy, hysterectomy.  SOCIAL HISTORY:  Lives in [place] with her husband, smokes 3 cigarettes on a daily basis.  No alcohol use.  No illicit drug use. Independent with daily activities.  FAMILY HISTORY:  Unremarkable per the patient.  REVIEW OF SYSTEMS:  GENERAL SYSTEM:  Positive for weakness, malaise. HEENT:  Unremarkable.  CARDIOVASCULAR:  As in HPI, otherwise unremarkable.  RESPIRATORY:  Unremarkable.  GI: As in  HPI.  GU: Unremarkable.  NEUROLOGICAL:  Unremarkable.  PSYCHIATRIC:  Unremarkable. DERMATOLOGICAL:  Unremarkable.  Other systems reviewed and found to be negative.  PHYSICAL EXAMINATION:  VITAL SIGNS:  Temperature 97.1, heart rate in the 80s, respiratory rate 16, blood pressure 101/55, saturation 93-95% on room air. GENERAL:  This is a thin female, in no distress. HEENT:  Head is normocephalic, atraumatic.  Pupils are equal and reacting.  No pallor, no icterus.  Oral mucous membranes slightly dry. No oral lesions are noted. NECK:  Soft and supple.  No thyromegaly is appreciated.  No cervical, supraclavicular, or inguinal lymphadenopathy is present. LUNGS:  Clear to auscultation bilaterally with no wheezing, rales,  or rhonchi. CARDIOVASCULAR:  S1 and S2 is normal, regular.  Systolic murmur is appreciated over the precordium.  No S3, S4, rubs, murmurs, or bruits. ABDOMEN:  Soft.  Tenderness in the right lower quadrant without any rebound, rigidity, or guarding.  Some fullness is appreciated but no obvious masses felt. GU:  Deferred. MUSCULOSKELETAL:  Normal muscle mass and tone. NEUROLOGIC:  She is alert and oriented x3.  No focal neurological deficits are present. SKIN:  Does not reveal any rashes.  LAB DATA:  White cell count is 16,100 with 90% neutrophils, hemoglobin 13.5, platelet count 319.  Sodium 135, potassium 4.5, creatinine 1.35, glucose 116.  Lipase 75, alk phos 64, CK 123, calcium 9.2, total bilirubin 0.7, AST 75, ALT 29.  These labs of those done at [facility].  EKG done over there showed normal sinus rhythm with a left axis deviation.  No Q-waves.  No concerning ST or T-wave changes are noted. There is an EKG from June 2010 which shows similar findings.  Chest x-ray was done at [facility] which was reported as being unremarkable.  They did send a CD but I have not been able to open it in any of our computers here.  ASSESSMENT:  This is a 70 year old female who presents with acute diarrhea.  She has had antibiotic use recently.  She ate out on Thursday in a [employer] and her husband had similar symptoms though not quite as severe.  So, differential diagnosis at this time includes Clostridium difficile, diarrhea related to food poisoning, gastroenteritis.  Her abdominal pain etiology is unclear.  This could be related to acute illness, however, she tells me that she has had this pain in the right lower quadrant for about 4 weeks or so.  She also has dysphagia for which she is supposed to see Dr. Karilyn Cota as an outpatient.  PLAN: 1. Acute diarrhea.  Stool studies will be sent.  She will be given IV     fluids.  At this time, we will hold off on putting her on any kind     of  Cipro or Flagyl. 2. Abdominal pain will be evaluated using a CT scan of the abdomen and     pelvis.  Lipase level was mildly elevated although I do not think     this is pancreatitis as her epigastrium is completely unremarkable.     We will check a UA as well. 3. Dysphagia.  We will consult Dr. Karilyn Cota to see this patient and     although I assumed studies if needed can be done as an outpatient. 4. History of hypertension.  We will await list of her medications     before initiating these medicines. 5. History of hypothyroidism, stable.  We will check a TSH and free T4     level.  6. History of fibromyalgia, stable. 7. History of anxiety disorder.  Continue with Xanax. 8. DVT prophylaxis with SCDs.  Further management decisions will depend on results of further testing and the patient's response to treatment.  Osvaldo Shipper, MD     GK/MEDQ  D:  09/23/2010  T:  09/23/2010  Job:  [id number]  cc:   Donna Bernard, M.D. Fax: [phone]  Lionel December, M.D. Fax: [phone]  Electronically Signed by Osvaldo Shipper MD on 10/19/2010 10:26:16 PM

## 2011-01-20 ENCOUNTER — Emergency Department (HOSPITAL_COMMUNITY): Payer: Medicare Other

## 2011-01-20 ENCOUNTER — Other Ambulatory Visit: Payer: Self-pay

## 2011-01-20 ENCOUNTER — Inpatient Hospital Stay (HOSPITAL_COMMUNITY)
Admission: EM | Admit: 2011-01-20 | Discharge: 2011-01-23 | DRG: 918 | Disposition: A | Discharge status: Home / Self Care | Payer: Medicare Other | Attending: Internal Medicine | Admitting: Internal Medicine

## 2011-01-20 DIAGNOSIS — M47816 Spondylosis without myelopathy or radiculopathy, lumbar region: Secondary | ICD-10-CM | POA: Diagnosis present

## 2011-01-20 DIAGNOSIS — H9319 Tinnitus, unspecified ear: Secondary | ICD-10-CM | POA: Diagnosis present

## 2011-01-20 DIAGNOSIS — R410 Disorientation, unspecified: Secondary | ICD-10-CM | POA: Diagnosis present

## 2011-01-20 DIAGNOSIS — F341 Dysthymic disorder: Secondary | ICD-10-CM | POA: Diagnosis present

## 2011-01-20 DIAGNOSIS — N390 Urinary tract infection, site not specified: Secondary | ICD-10-CM | POA: Diagnosis present

## 2011-01-20 DIAGNOSIS — E876 Hypokalemia: Secondary | ICD-10-CM | POA: Diagnosis not present

## 2011-01-20 DIAGNOSIS — R109 Unspecified abdominal pain: Secondary | ICD-10-CM | POA: Diagnosis present

## 2011-01-20 DIAGNOSIS — T368X1A Poisoning by other systemic antibiotics, accidental (unintentional), initial encounter: Secondary | ICD-10-CM | POA: Diagnosis present

## 2011-01-20 DIAGNOSIS — F19921 Other psychoactive substance use, unspecified with intoxication with delirium: Secondary | ICD-10-CM | POA: Diagnosis present

## 2011-01-20 DIAGNOSIS — E039 Hypothyroidism, unspecified: Secondary | ICD-10-CM | POA: Diagnosis present

## 2011-01-20 DIAGNOSIS — T3691XA Poisoning by unspecified systemic antibiotic, accidental (unintentional), initial encounter: Secondary | ICD-10-CM | POA: Diagnosis present

## 2011-01-20 DIAGNOSIS — R4182 Altered mental status, unspecified: Secondary | ICD-10-CM

## 2011-01-20 DIAGNOSIS — J329 Chronic sinusitis, unspecified: Secondary | ICD-10-CM | POA: Diagnosis present

## 2011-01-20 DIAGNOSIS — M797 Fibromyalgia: Secondary | ICD-10-CM | POA: Diagnosis present

## 2011-01-20 DIAGNOSIS — J40 Bronchitis, not specified as acute or chronic: Secondary | ICD-10-CM | POA: Diagnosis present

## 2011-01-20 DIAGNOSIS — D171 Benign lipomatous neoplasm of skin and subcutaneous tissue of trunk: Secondary | ICD-10-CM | POA: Diagnosis present

## 2011-01-20 DIAGNOSIS — I10 Essential (primary) hypertension: Secondary | ICD-10-CM | POA: Diagnosis present

## 2011-01-20 DIAGNOSIS — T426X1A Poisoning by other antiepileptic and sedative-hypnotic drugs, accidental (unintentional), initial encounter: Principal | ICD-10-CM | POA: Diagnosis present

## 2011-01-20 DIAGNOSIS — T380X1A Poisoning by glucocorticoids and synthetic analogues, accidental (unintentional), initial encounter: Secondary | ICD-10-CM | POA: Diagnosis present

## 2011-01-20 DIAGNOSIS — G894 Chronic pain syndrome: Secondary | ICD-10-CM | POA: Diagnosis present

## 2011-01-20 DIAGNOSIS — F419 Anxiety disorder, unspecified: Secondary | ICD-10-CM | POA: Diagnosis present

## 2011-01-20 DIAGNOSIS — G8929 Other chronic pain: Secondary | ICD-10-CM | POA: Diagnosis present

## 2011-01-20 DIAGNOSIS — IMO0001 Reserved for inherently not codable concepts without codable children: Secondary | ICD-10-CM | POA: Diagnosis present

## 2011-01-20 DIAGNOSIS — T38801A Poisoning by unspecified hormones and synthetic substitutes, accidental (unintentional), initial encounter: Secondary | ICD-10-CM | POA: Diagnosis present

## 2011-01-20 DIAGNOSIS — K219 Gastro-esophageal reflux disease without esophagitis: Secondary | ICD-10-CM | POA: Diagnosis present

## 2011-01-20 HISTORY — DX: Anxiety disorder, unspecified: F41.9

## 2011-01-20 HISTORY — DX: Essential (primary) hypertension: I10

## 2011-01-20 HISTORY — DX: Hypothyroidism, unspecified: E03.9

## 2011-01-20 HISTORY — DX: Spondylosis without myelopathy or radiculopathy, lumbar region: M47.816

## 2011-01-20 LAB — BASIC METABOLIC PANEL
BUN: 11 mg/dL (ref 6–23)
Calcium: 10.1 mg/dL (ref 8.4–10.5)
GFR calc non Af Amer: >60 mL/min (ref >60)
Glucose, Bld: 121 mg/dL — ABNORMAL HIGH (ref 70–99)
Sodium: 138 mEq/L (ref 135–145)

## 2011-01-20 LAB — URINALYSIS, ROUTINE W REFLEX MICROSCOPIC
Bilirubin Urine: NEGATIVE
Protein, ur: 100 mg/dL — AB
Urobilinogen, UA: 0.2 mg/dL (ref 0.0–1.0)

## 2011-01-20 LAB — CBC
MCH: 31.4 pg (ref 26.0–34.0)
MCHC: 33.6 g/dL (ref 30.0–36.0)
MCV: 93.7 fL (ref 78.0–100.0)
Platelets: 296 10*3/uL (ref 150–400)
RBC: 4.77 MIL/uL (ref 3.87–5.11)
RDW: 14.4 % (ref 11.5–15.5)

## 2011-01-20 LAB — DIFFERENTIAL
Eosinophils Absolute: 0 10*3/uL (ref 0.0–0.7)
Eosinophils Relative: 0 % (ref 0–5)
Lymphs Abs: 2.8 10*3/uL (ref 0.7–4.0)

## 2011-01-20 LAB — RAPID URINE DRUG SCREEN, HOSP PERFORMED
Barbiturates: NOT DETECTED
Tetrahydrocannabinol: NOT DETECTED

## 2011-01-20 LAB — URINE MICROSCOPIC-ADD ON

## 2011-01-20 MED ORDER — ALPRAZOLAM 0.5 MG PO TABS
0.5000 mg | ORAL_TABLET | Freq: Three times a day (TID) | ORAL | Status: DC | PRN
  Administered 2011-01-21 – 2011-01-22 (×2): 0.5 mg via ORAL
  Filled 2011-01-20 (×2): qty 1

## 2011-01-20 MED ORDER — SODIUM CHLORIDE 0.9 % IJ SOLN
INTRAMUSCULAR | Status: AC
  Filled 2011-01-20: qty 3

## 2011-01-20 MED ORDER — POLYETHYLENE GLYCOL 3350 17 GM/SCOOP PO POWD
17.0000 g | Freq: Every day | ORAL | Status: DC
  Filled 2011-01-20: qty 255

## 2011-01-20 MED ORDER — ONDANSETRON HCL 4 MG/2ML IJ SOLN: 4.0000 mg | Freq: Four times a day (QID) | INTRAMUSCULAR | Status: DC | PRN

## 2011-01-20 MED ORDER — ENOXAPARIN SODIUM 40 MG/0.4ML ~~LOC~~ SOLN
40.0000 mg | SUBCUTANEOUS | Status: DC
  Administered 2011-01-20 – 2011-01-22 (×3): 40 mg via SUBCUTANEOUS
  Filled 2011-01-20 (×3): qty 0.4

## 2011-01-20 MED ORDER — SODIUM CHLORIDE 0.9 % IV SOLN
INTRAVENOUS | Status: DC
  Administered 2011-01-20: 17:00:00 via INTRAVENOUS

## 2011-01-20 MED ORDER — ACETAMINOPHEN 650 MG RE SUPP: 650.0000 mg | Freq: Four times a day (QID) | RECTAL | Status: DC | PRN

## 2011-01-20 MED ORDER — CITALOPRAM HYDROBROMIDE 20 MG PO TABS
20.0000 mg | ORAL_TABLET | Freq: Every day | ORAL | Status: DC
  Administered 2011-01-21 – 2011-01-23 (×3): 20 mg via ORAL
  Filled 2011-01-20 (×3): qty 1

## 2011-01-20 MED ORDER — ACETAMINOPHEN 325 MG PO TABS
650.0000 mg | ORAL_TABLET | Freq: Four times a day (QID) | ORAL | Status: DC | PRN
  Administered 2011-01-21 – 2011-01-23 (×3): 650 mg via ORAL
  Filled 2011-01-20 (×3): qty 2

## 2011-01-20 MED ORDER — LEVOTHYROXINE SODIUM 112 MCG PO TABS
112.0000 ug | ORAL_TABLET | Freq: Every day | ORAL | Status: DC
  Administered 2011-01-21 – 2011-01-23 (×3): 112 ug via ORAL
  Filled 2011-01-20 (×4): qty 1

## 2011-01-20 MED ORDER — ONDANSETRON HCL 4 MG PO TABS
4.0000 mg | ORAL_TABLET | Freq: Four times a day (QID) | ORAL | Status: DC | PRN
  Administered 2011-01-21: 4 mg via ORAL
  Filled 2011-01-20: qty 1

## 2011-01-20 MED ORDER — NORTRIPTYLINE HCL 25 MG PO CAPS
50.0000 mg | ORAL_CAPSULE | Freq: Every day | ORAL | Status: DC
Start: 2011-01-20 — End: 2011-01-23
  Administered 2011-01-21 – 2011-01-22 (×2): 50 mg via ORAL
  Filled 2011-01-20 (×2): qty 2

## 2011-01-20 MED ORDER — OXYCODONE HCL 5 MG PO TABS: 5.0000 mg | ORAL_TABLET | ORAL | Status: DC | PRN

## 2011-01-20 MED ORDER — LISINOPRIL 10 MG PO TABS
10.0000 mg | ORAL_TABLET | Freq: Every day | ORAL | Status: DC
  Administered 2011-01-20 – 2011-01-21 (×2): 10 mg via ORAL
  Filled 2011-01-20 (×2): qty 1

## 2011-01-20 MED ORDER — SODIUM CHLORIDE 0.9 % IV BOLUS (SEPSIS)
500.0000 mL | Freq: Once | INTRAVENOUS | Status: AC
  Administered 2011-01-20: 1000 mL via INTRAVENOUS

## 2011-01-20 MED ORDER — DEXTROSE 5 % IV SOLN
1.0000 g | INTRAVENOUS | Status: DC
  Administered 2011-01-20 – 2011-01-22 (×3): 1 g via INTRAVENOUS
  Filled 2011-01-20 (×4): qty 1

## 2011-01-20 MED ORDER — DEXTROSE 5 % IV SOLN
INTRAVENOUS | Status: AC
  Filled 2011-01-20: qty 1

## 2011-01-20 NOTE — ED Notes (Signed)
Pt is not on Paxil after all. Pt was put on Gabapentin, Prednisone and Levaquin on Wednesday, not Paxil. PT back from CT. NAD at this time.

## 2011-01-20 NOTE — ED Notes (Signed)
Report called and given to [facility].

## 2011-01-20 NOTE — H&P (Addendum)
Hospital Admission Note Date: 01/20/2011  Patient name: [name] Medical record number: [id number] Date of birth: 1940-11-28 Age: 70 y.o. Gender: female PCP: Harlow Asa, MD, MD  Attending physician: Elliot Cousin  Chief Complaint: Confusion  History of Present Illness: [name] is an 70 y.o. female who was brought to the emergency room with forgetfulness and confusion. She is unable to provide much history. Her brother is here and a family friend. She is married, but the husband is not here. She was reportedly started on Levaquin, gabapentin, and prednisone a few days ago. Since then she's become confused. No one can tell why she was placed on these medications. The patient complains of a headache currently but no other complaints. The brother and friend report that she has no issues with confusion in the past. The history is unreliable from the patient, as she has told a different history continuously in the emergency room. I reviewed old records to get some of the history.  Past Medical History  Diagnosis Date  . Anxiety    chronic abdominal pain, anxiety, fibromyalgia, hypothyroidism, gastroesophageal reflux disease  Meds: See medicine reconciliation. Pill bottles were reviewed by myself.  Allergies: Reportedly no known drug allergies  Social history: Patient is married. She does not work. She does not have any reported history of drinking or smoking.  Surgical history per chart includes gastric bypass procedure, hysterectomy, cholecystectomy, exploratory laparotomy  Review of Systems: Unable due to confusion  Physical Exam: Blood pressure 190/81, pulse 81, resp. rate 20, SpO2 97.00%. BP 190/81  Pulse 81  Resp 20  SpO2 97%  General Appearance:    Alert, cooperative, appears stated age  Head:    Normocephalic, without obvious abnormality, atraumatic  Eyes:    PERRL, conjunctiva/corneas clear, EOM's intact, fundi    benign, both eyes  Ears:    Normal TM's  and external ear canals, both ears  Nose:   Nares normal, septum midline, mucosa normal, no drainage    or sinus tenderness  Throat:   Lips, mucosa, and tongue normal; teeth and gums normal  Neck:   Supple, symmetrical, trachea midline, no adenopathy;    thyroid:  no enlargement/tenderness/nodules; no carotid   bruit or JVD  Back:     Symmetric, no curvature, ROM normal, no CVA tenderness  Lungs:     Clear to auscultation bilaterally, respirations unlabored  Chest Wall:    No tenderness or deformity   Heart:    Regular rate and rhythm, S1 and S2 normal, no murmur, rub   or gallop  Breast Exam:    deferred   Abdomen:     multiple healed scars. Soft. She has a left lower quadrant mass. She has lower abdominal tenderness   Genitalia:   deferred   Rectal:   deferred   Extremities:   Extremities normal, atraumatic, no cyanosis or edema  Pulses:   2+ and symmetric all extremities  Skin:   Skin color, texture, turgor normal, no rashes or lesions  Lymph nodes:   Cervical, supraclavicular, and axillary nodes normal  Neurologic:   CNII-XII intact, normal strength, sensation and reflexes    throughout patient is alert but disoriented to time place and situation.     Psychiatric: Patient is cooperative but occasionally anxious and tearful  Lab results: Basic Metabolic Panel:  Basename 01/20/11 1619  NA 138  K 4.2  CL 99  CO2 27  GLUCOSE 121*  BUN 11  CREATININE 0.67  CALCIUM 10.1  MG --  PHOS --   Liver Function Tests: No results found for this basename: AST:2,ALT:2,ALKPHOS:2,BILITOT:2,PROT:2,ALBUMIN:2 in the last 72 hours No results found for this basename: LIPASE:2,AMYLASE:2 in the last 72 hours No results found for this basename: AMMONIA:2 in the last 72 hours CBC:  Basename 01/20/11 1619  WBC 11.1*  NEUTROABS 7.2  HGB 15.0  HCT 44.7  MCV 93.7  PLT 296   Cardiac Enzymes: No results found for this basename: CKTOTAL:3,CKMB:3,CKMBINDEX:3,TROPONINI:3 in the last 72  hours BNP: No results found for this basename: POCBNP:3 in the last 72 hours D-Dimer: No results found for this basename: DDIMER:2 in the last 72 hours CBG:  Basename 01/20/11 1540  GLUCAP 122*   Hemoglobin A1C: No results found for this basename: HGBA1C in the last 72 hours Fasting Lipid Panel: No results found for this basename: CHOL,HDL,LDLCALC,TRIG,CHOLHDL,LDLDIRECT in the last 72 hours Thyroid Function Tests: No results found for this basename: TSH,T4TOTAL,FREET4,T3FREE,THYROIDAB in the last 72 hours Anemia Panel: No results found for this basename: VITAMINB12,FOLATE,FERRITIN,TIBC,IRON,RETICCTPCT in the last 72 hours Urine Drug Screen: Positive for opiates and benzodiazepines. Alcohol Level: No results found for this basename: ETH:2 in the last 72 hours Urinalysis: Shows a specific gravity of 1.025 pH 7.0 negative glucose negative bilirubin 15 ketones 100 protein negative nitrite small leukocyte esterase 11-20 white cells 3-6 red cells few squamous epithelial cells few bacteria small hemoglobin  Imaging results:  Ct Head Wo Contrast  01/20/2011  *RADIOLOGY REPORT*  Clinical Data: Altered mental status.  CT HEAD WITHOUT CONTRAST  Technique:  Contiguous axial images were obtained from the base of the skull through the vertex without contrast.  Comparison: None  Findings: No sign of skull fracture.  No evidence of intracranial hemorrhage.  The brain shows areas of low density within the hemispheric white matter presumed represent chronic small vessel disease.  No acute infarction is discernible.  No mass lesion, hydrocephalus or extra-axial collection.  No fluid in the sinuses or mastoids.  IMPRESSION: No identifiably acute finding.  Scattered areas of low density within the hemispheric white matter presumed to represent chronic small vessel disease.  Original Report Authenticated By: Thomasenia Sales, M.D.    Assessment & Plan: Principal Problem:  *Delirium Active Problems:  UTI  (urinary tract infection)  LLQ abdominal mass  Anxiety  Chronic abdominal pain  Hypothyroid  Fibromyalgia  GERD (gastroesophageal reflux disease)  Her confusion seems to coincide with the institution of her new medications (levofloxacin, gabapentin, and prednisone). These will be stopped. I have paged Dr. Lilyan Punt who is on-call for Dr. Lubertha South. We will need to get more information from the office about her medical history and why these were started. She does have evidence of a mild urinary tract infection which could be contributing. I will put her on Rocephin for now. Get a urine culture. The culture may be sterile because she is on levofloxacin.  I will continue her benzodiazepines as needed. Decrease her opiates.  I will check a TSH. If her confusion remains, she will need further workup. I will also check a chest x-ray to rule out pneumonia.  I have looked through old records and I see no history of left lower cord or mass. She's had multiple abdominal surgeries. The patient tells me that she had an ovarian cyst but I see no mention of this in the CAT scan or previous records. We will need to see whether this is a new finding and if so she may need further imaging.  We may be able to get more history once patient's confusion has cleared. I will get a flat and upright abdominal x-ray.  Sharlon Pfohl L 01/20/2011, 6:25 PM

## 2011-01-20 NOTE — ED Provider Notes (Addendum)
Scribed for Veronica Hutching, MD, the patient was seen in room APA15/AP[facility]15 . This chart was scribed by Ellie Lunch. This patient's care was started at 3:52 PM.   CSN: [id number] Arrival date & time: 01/20/2011  3:30 PM  Chief Complaint  Patient presents with  . Altered Mental Status   HPI Pt seen at 15:52 TAIS [name] is a 70 y.o. female who presents to the Emergency Department complaining of  Altered mental status starting 2 days ago. PT states she can't remember what happened, but believes she took too many Xanax. PT had 90 Xanax on 12/25/2010 and now has 7. Additionally PT started taking new medications :Prednisone, Levaquin, Gabapentin  on Wednesday (3 days ago) Pt c/o associated insomnia since onset.   Level V caveat altered mental status  Past Medical History  Diagnosis Date  . Anxiety     History reviewed. No pertinent past surgical history.  MEDICATIONS:  Previous Medications   ALPRAZOLAM (XANAX) 1 MG TABLET    Take 0.5 mg by mouth 3 (three) times daily as needed. Patient takes 0.5mg  three times a day during the day then 1&1/2 tablet at bedtime    CITALOPRAM (CELEXA) 20 MG TABLET    Take 20 mg by mouth daily.     GABAPENTIN (NEURONTIN) 300 MG CAPSULE    Take 300 mg by mouth 3 (three) times daily.     HYDROCODONE-ACETAMINOPHEN (NORCO) 10-325 MG PER TABLET    Take 1 tablet by mouth every 4 (four) hours as needed. Pain    LEVOTHYROXINE (SYNTHROID, LEVOTHROID) 112 MCG TABLET    Take 112 mcg by mouth daily.     NORTRIPTYLINE (PAMELOR) 50 MG CAPSULE    Take 50 mg by mouth at bedtime.     PREDNISONE (DELTASONE) 20 MG TABLET    Take 20 mg by mouth daily. Patient takes 3tabs daily for 3 days;,then 2tabs daily for 3 days;,then 1tab daily for 3 days started on 01/17/2011    RANITIDINE (ZANTAC) 300 MG CAPSULE    Take 300 mg by mouth daily.       ALLERGIES:  Allergies as of 01/20/2011  . (No Known Allergies)     No family history on file.  History  Substance Use Topics  . Smoking  status: Not on file  . Smokeless tobacco: Not on file  . Alcohol Use:   Accompanied to ED with female companion.  Review of Systems  Psychiatric/Behavioral: Positive for confusion and sleep disturbance.  All other systems reviewed and are negative.    Physical Exam  BP 167/85  Pulse 87  Resp 18  SpO2 97%  Physical Exam  Nursing note and vitals reviewed. Constitutional: She appears well-developed and well-nourished.  HENT:  Head: Normocephalic and atraumatic.  Eyes: Conjunctivae and EOM are normal. Pupils are equal, round, and reactive to light.  Neck: Neck supple.  Cardiovascular: Normal rate, regular rhythm and normal heart sounds.   Pulmonary/Chest: Effort normal and breath sounds normal.  Abdominal: Soft. Bowel sounds are normal.  Musculoskeletal: Normal range of motion.  Neurological:       Able to move all extremities but not to appropriate command. Disoriented. Knows name and date but confused about location. Did not answer questions appropriately.    Skin: Skin is warm and dry.    Procedures  OTHER DATA REVIEWED: Nursing notes, vital signs, and past medical records reviewed.  DIAGNOSTIC STUDIES: Oxygen saturation is 97% on room air, normal by my interpretation.    Date: 01/20/2011  Rate: 88  Rhythm: normal sinus rhythm  QRS Axis: normal  Intervals: RBBB; LAFB  ST/T Wave abnormalities: normal  Conduction Disutrbances:none  Narrative Interpretation:   Old EKG Reviewed: changes noted  LABS / RADIOLOGY: Results for orders placed during the hospital encounter of 01/20/11  GLUCOSE, CAPILLARY      Component Value Range   Glucose-Capillary 122 (*) 70 - 99 (mg/dL)   Comment 1 Notify RN    CBC      Component Value Range   WBC 11.1 (*) 4.0 - 10.5 (K/uL)   RBC 4.77  3.87 - 5.11 (MIL/uL)   Hemoglobin 15.0  12.0 - 15.0 (g/dL)   HCT 81.1  91.4 - 78.2 (%)   MCV 93.7  78.0 - 100.0 (fL)   MCH 31.4  26.0 - 34.0 (pg)   MCHC 33.6  30.0 - 36.0 (g/dL)   RDW 95.6   21.3 - 08.6 (%)   Platelets 296  150 - 400 (K/uL)  DIFFERENTIAL      Component Value Range   Neutrophils Relative 65  43 - 77 (%)   Neutro Abs 7.2  1.7 - 7.7 (K/uL)   Lymphocytes Relative 26  12 - 46 (%)   Lymphs Abs 2.8  0.7 - 4.0 (K/uL)   Monocytes Relative 9  3 - 12 (%)   Monocytes Absolute 1.0  0.1 - 1.0 (K/uL)   Eosinophils Relative 0  0 - 5 (%)   Eosinophils Absolute 0.0  0.0 - 0.7 (K/uL)   Basophils Relative 0  0 - 1 (%)   Basophils Absolute 0.0  0.0 - 0.1 (K/uL)  BASIC METABOLIC PANEL      Component Value Range   Sodium 138  135 - 145 (mEq/L)   Potassium 4.2  3.5 - 5.1 (mEq/L)   Chloride 99  96 - 112 (mEq/L)   CO2 27  19 - 32 (mEq/L)   Glucose, Bld 121 (*) 70 - 99 (mg/dL)   BUN 11  6 - 23 (mg/dL)   Creatinine, Ser 5.78  0.50 - 1.10 (mg/dL)   Calcium 46.9  8.4 - 10.5 (mg/dL)   GFR calc non Af Amer >60  >60 (mL/min)   GFR calc Af Amer >60  >60 (mL/min)   Ct Head Wo Contrast  01/20/2011  *RADIOLOGY REPORT*  Clinical Data: Altered mental status.  CT HEAD WITHOUT CONTRAST  Technique:  Contiguous axial images were obtained from the base of the skull through the vertex without contrast.  Comparison: None  Findings: No sign of skull fracture.  No evidence of intracranial hemorrhage.  The brain shows areas of low density within the hemispheric white matter presumed represent chronic small vessel disease.  No acute infarction is discernible.  No mass lesion, hydrocephalus or extra-axial collection.  No fluid in the sinuses or mastoids.  IMPRESSION: No identifiably acute finding.  Scattered areas of low density within the hemispheric white matter presumed to represent chronic small vessel disease.  Original Report Authenticated By: Thomasenia Sales, M.D.   MDM:  Discussed with PT and female companion diagnostic possibilities and treatment.  Check brain scan to rule out stroke.  Check blood work and urine sample to rule out UTI. Medication could be a possibility to explain  Admission is  likely.   IMPRESSION: Diagnoses that have been ruled out:  Diagnoses that are still under consideration:  Final diagnoses:    MEDICATIONS GIVEN IN THE E.D.  Medications  sodium chloride 0.9 % bolus 500 mL (not administered)  0.9 %  sodium chloride infusion (not administered)    SCRIBE ATTESTATION: I personally performed the services described in this documentation, which was scribed in my presence. The recorded information has been reviewed and considered. Veronica Hutching, MD   I suspect the cause of patient's altered mental status is medication related. CT scan negative. Urine sample pending. Patient started on prednisone, Levaquin, gabapentin on Wednesday. Symptoms started on Thursday. Patient will be admitted          Veronica Hutching, MD 01/20/11 1737  Veronica Hutching, MD 01/20/11 [id number]

## 2011-01-20 NOTE — ED Notes (Signed)
Pt family member reports pt being confused since last Thursday.  Pt reports inability to remember certain events.  Pt is unable to answer what month it was or who the president is.  Pt has equal strength bilaterally in all extremities.  Pt family member states that the pt's xanax bottle was filled on 8/13 with qty 90, and now there are only 7 left.  Pt is tearful and keeps saying "what is happening to me?"

## 2011-01-20 NOTE — ED Notes (Signed)
Pt is very confused.  She cannot answer any questions about her medical history or her medications.  Family at bedside

## 2011-01-20 NOTE — ED Notes (Signed)
Report attempted. Nikki to return call. NAD.

## 2011-01-21 ENCOUNTER — Inpatient Hospital Stay (HOSPITAL_COMMUNITY): Payer: Medicare Other

## 2011-01-21 DIAGNOSIS — I10 Essential (primary) hypertension: Secondary | ICD-10-CM | POA: Diagnosis present

## 2011-01-21 DIAGNOSIS — H9319 Tinnitus, unspecified ear: Secondary | ICD-10-CM | POA: Diagnosis present

## 2011-01-21 DIAGNOSIS — J329 Chronic sinusitis, unspecified: Secondary | ICD-10-CM | POA: Diagnosis present

## 2011-01-21 LAB — TSH: TSH: 6.694 u[IU]/mL — ABNORMAL HIGH (ref 0.350–4.500)

## 2011-01-21 MED ORDER — FAMOTIDINE 20 MG PO TABS
20.0000 mg | ORAL_TABLET | Freq: Two times a day (BID) | ORAL | Status: DC
  Administered 2011-01-21 – 2011-01-23 (×5): 20 mg via ORAL
  Filled 2011-01-21 (×5): qty 1

## 2011-01-21 MED ORDER — SODIUM CHLORIDE 0.9 % IV SOLN: INTRAVENOUS | Status: DC

## 2011-01-21 MED ORDER — IOHEXOL 300 MG/ML  SOLN
100.0000 mL | Freq: Once | INTRAMUSCULAR | Status: AC | PRN
  Administered 2011-01-21: 100 mL via INTRAVENOUS

## 2011-01-21 MED ORDER — POTASSIUM CHLORIDE IN NACL 20-0.9 MEQ/L-% IV SOLN
INTRAVENOUS | Status: DC
  Administered 2011-01-21 – 2011-01-23 (×2): via INTRAVENOUS

## 2011-01-21 MED ORDER — POLYETHYLENE GLYCOL 3350 17 G PO PACK
17.0000 g | PACK | Freq: Every day | ORAL | Status: DC
  Administered 2011-01-21: 17 g via ORAL
  Administered 2011-01-22: 10:00:00 via ORAL
  Filled 2011-01-21 (×3): qty 1

## 2011-01-21 NOTE — Progress Notes (Signed)
Subjective: The patient is currently lying in bed. Her husband is sitting in the room. He says that she was very confused last night, however, this morning she is not as confused. The patient acknowledges some confusion. She told me that she was started on the medications in question prior to this hospitalization for treatment of ringing in her ears. However, Dr. Lilyan Punt, reports that the patient was started on gabapentin, prednisone, and Levaquin, for treatment of sinusitis. She has no complaints of facial pain currently. She does have some abdominal discomfort, but no nausea or vomiting.  Objective: Vital signs in last 24 hours: Filed Vitals:   01/20/11 1930 01/20/11 2200 01/21/11 0212 01/21/11 0600  BP: 194/79 192/103 178/90 150/83  Pulse: 86 88 84 80  Temp: 98.4 F (36.9 C) 97.1 F (36.2 C) 98.7 F (37.1 C) 98.6 F (37 C)  TempSrc: Oral Oral Oral Oral  Resp: 20 20 16 18   Height: 5\' 5"  (1.651 m)     Weight: 78.3 kg (172 lb 9.9 oz)     SpO2: 91% 95% 94% 95%    Intake/Output Summary (Last 24 hours) at 01/21/11 1257 Last data filed at 01/21/11 0600  Gross per 24 hour  Intake    480 ml  Output      0 ml  Net    480 ml    Weight change:   Exam:  General: The patient is currently sitting up in bed, in no acute distress. HEENT: Head is normocephalic nontraumatic. There is no facial pain upon palpation. Lungs: Clear to auscultation bilaterally. Heart: Distant S1-S2 with no murmurs rubs or gallops. Abdomen: Positive bowel sounds, obese, mildly tender at the left lower cautery, large abdominal left lower quadrant mass palpated, no distention, no hepatosplenomegaly. Extremities: Pedal pulses palpable. No pretibial edema and no pedal edema. Neurologic: She is alert and oriented to herself, her husband, year, month, and place. Her speech is clear. Cranial nerves II through XII are grossly intact.  Lab Results: Basic Metabolic Panel:  Basename 01/20/11 1619  NA 138  K 4.2  CL  99  CO2 27  GLUCOSE 121*  BUN 11  CREATININE 0.67  CALCIUM 10.1  MG --  PHOS --   Liver Function Tests: No results found for this basename: AST:2,ALT:2,ALKPHOS:2,BILITOT:2,PROT:2,ALBUMIN:2 in the last 72 hours No results found for this basename: LIPASE:2,AMYLASE:2 in the last 72 hours No results found for this basename: AMMONIA:2 in the last 72 hours CBC:  Basename 01/20/11 1619  WBC 11.1*  NEUTROABS 7.2  HGB 15.0  HCT 44.7  MCV 93.7  PLT 296   Cardiac Enzymes: No results found for this basename: CKTOTAL:3,CKMB:3,CKMBINDEX:3,TROPONINI:3 in the last 72 hours BNP: No results found for this basename: POCBNP:3 in the last 72 hours D-Dimer: No results found for this basename: DDIMER:2 in the last 72 hours CBG:  Basename 01/20/11 1540  GLUCAP 122*   Hemoglobin A1C: No results found for this basename: HGBA1C in the last 72 hours Fasting Lipid Panel: No results found for this basename: CHOL,HDL,LDLCALC,TRIG,CHOLHDL,LDLDIRECT in the last 72 hours Thyroid Function Tests: No results found for this basename: TSH,T4TOTAL,FREET4,T3FREE,THYROIDAB in the last 72 hours Anemia Panel: No results found for this basename: VITAMINB12,FOLATE,FERRITIN,TIBC,IRON,RETICCTPCT in the last 72 hours Urine Drug Screen:  Alcohol Level: No results found for this basename: ETH:2 in the last 72 hours Urinalysis: Urine color yellow, urine ketones 15, urine protein 100, urine nitrite negative, urine leukocytes small, wbc's 11-20, RBCs 3-6, few squamous cells, few bacteria.  Misc. Labs:  Urine drug screen: Positive for benzodiazepines and opiates.   Micro: No results found for this or any previous visit (from the past 240 hour(s)).  Studies/Results: Ct Head Wo Contrast  01/20/2011  *RADIOLOGY REPORT*  Clinical Data: Altered mental status.  CT HEAD WITHOUT CONTRAST  Technique:  Contiguous axial images were obtained from the base of the skull through the vertex without contrast.  Comparison: None   Findings: No sign of skull fracture.  No evidence of intracranial hemorrhage.  The brain shows areas of low density within the hemispheric white matter presumed represent chronic small vessel disease.  No acute infarction is discernible.  No mass lesion, hydrocephalus or extra-axial collection.  No fluid in the sinuses or mastoids.  IMPRESSION: No identifiably acute finding.  Scattered areas of low density within the hemispheric white matter presumed to represent chronic small vessel disease.  Original Report Authenticated By: Thomasenia Sales, M.D.   Dg Abd Acute W/chest  01/20/2011  *RADIOLOGY REPORT*  Clinical Data: Cough.  Abdominal pain.  ACUTE ABDOMEN SERIES (ABDOMEN 2 VIEW & CHEST 1 VIEW)  Comparison: 08/29/2010 and hospital chest x-ray.  09/24/2010 [facility] abdominal CT.  Findings: Left base scarring versus atelectasis.  Peripheral aspect right lung atelectasis versus small amount of fluid in the fissure.  This can be addressed on follow-up.  No infiltrate, congestive heart failure or pneumothorax.  Calcified mildly tortuous aorta.  Heart size within normal limits. The  Nonspecific bowel gas pattern without plain film evidence of bowel obstruction or free intraperitoneal air.  The CT detected small bowel abnormality is not well appreciated on the present plain film exam.  Surgical clips.  IMPRESSION: No plain film evidence of obstruction or free intraperitoneal air. Please see above discussion.  Original Report Authenticated By: Fuller Canada, M.D.    Medications: I have reviewed the patient's current medications.  Assessment: Principal Problem:  *Delirium Active Problems:  UTI (urinary tract infection)  LLQ abdominal mass  Anxiety  Chronic abdominal pain  Hypothyroid  Fibromyalgia  GERD (gastroesophageal reflux disease)  HTN (hypertension), malignant  Sinusitis  Tinnitus  1. Delirium, likely drug-induced from a combination of prednisone, Levaquin, and gabapentin. It is  unclear if the combination caused her confusion or if one of these medications was the culprit. Her delirium may also be secondary to the urinary tract infection as well. Her confusion is resolving according to her husband.  History of sinusitis and tinnitus. There is no current evidence of an active sinusitis.  Urinary tract infection. She was started on Rocephin. Urine culture is pending.  Left lower quadrant abdominal mass. Dr. Lilyan Punt, obtained records from his office. His brother, Brett Canales, is her primary care physician. However, there was no evidence in his notes that the patient had a known abdominal mass.  Chronic pain syndrome, chronic abdominal pain, and fibromyalgia.  Anxiety/depression. She is treated chronically with Celexa, nortriptyline, and Xanax.  Hypertension. Her blood pressure has improved since admission. She is to chronically with lisinopril.  Hypothyroidism. She is being maintained on Synthroid. TSH is pending.   Plan: We will order a CT scan of her abdomen and pelvis to evaluate the abdominal mass. We will restart H2 blocker therapy with Pepcid. We'll resume Zantac following discharge.   LOS: 1 day   Augustus Zurawski 01/21/2011, 12:57 PM

## 2011-01-22 DIAGNOSIS — E876 Hypokalemia: Secondary | ICD-10-CM | POA: Diagnosis not present

## 2011-01-22 DIAGNOSIS — J40 Bronchitis, not specified as acute or chronic: Secondary | ICD-10-CM | POA: Diagnosis present

## 2011-01-22 DIAGNOSIS — D171 Benign lipomatous neoplasm of skin and subcutaneous tissue of trunk: Secondary | ICD-10-CM | POA: Diagnosis present

## 2011-01-22 LAB — BASIC METABOLIC PANEL
BUN: 6 mg/dL (ref 6–23)
Chloride: 102 mEq/L (ref 96–112)
Glucose, Bld: 99 mg/dL (ref 70–99)
Potassium: 3.4 mEq/L — ABNORMAL LOW (ref 3.5–5.1)

## 2011-01-22 LAB — CBC
HCT: 43.7 % (ref 36.0–46.0)
Hemoglobin: 14.6 g/dL (ref 12.0–15.0)
MCHC: 33.4 g/dL (ref 30.0–36.0)

## 2011-01-22 MED ORDER — LISINOPRIL 5 MG PO TABS
15.0000 mg | ORAL_TABLET | Freq: Every day | ORAL | Status: DC
  Administered 2011-01-23: 15 mg via ORAL
  Filled 2011-01-22: qty 1

## 2011-01-22 MED ORDER — SODIUM CHLORIDE 0.9 % IJ SOLN
INTRAMUSCULAR | Status: AC
  Filled 2011-01-22: qty 10

## 2011-01-22 MED ORDER — POTASSIUM CHLORIDE CRYS ER 20 MEQ PO TBCR
20.0000 meq | EXTENDED_RELEASE_TABLET | Freq: Two times a day (BID) | ORAL | Status: AC
  Administered 2011-01-22 (×2): 20 meq via ORAL
  Filled 2011-01-22 (×2): qty 1

## 2011-01-22 NOTE — Progress Notes (Signed)
UR Chart Review Completed  

## 2011-01-23 ENCOUNTER — Encounter (HOSPITAL_COMMUNITY): Payer: Self-pay | Admitting: Internal Medicine

## 2011-01-23 DIAGNOSIS — M47816 Spondylosis without myelopathy or radiculopathy, lumbar region: Secondary | ICD-10-CM | POA: Diagnosis present

## 2011-01-23 HISTORY — DX: Spondylosis without myelopathy or radiculopathy, lumbar region: M47.816

## 2011-01-23 LAB — URINE CULTURE: Culture  Setup Time: 201209092144

## 2011-01-23 MED ORDER — CEFUROXIME AXETIL 250 MG PO TABS: 250.0000 mg | ORAL_TABLET | Freq: Two times a day (BID) | ORAL | Status: AC

## 2011-01-23 MED ORDER — LISINOPRIL 10 MG PO TABS: ORAL_TABLET | ORAL | Status: DC

## 2011-01-23 MED ORDER — ALBUTEROL SULFATE HFA 108 (90 BASE) MCG/ACT IN AERS: 2.0000 | INHALATION_SPRAY | RESPIRATORY_TRACT | Status: DC

## 2011-01-23 MED ORDER — FLUTICASONE-SALMETEROL 250-50 MCG/DOSE IN AEPB: 1.0000 | INHALATION_SPRAY | Freq: Two times a day (BID) | RESPIRATORY_TRACT | Status: DC

## 2011-01-23 MED ORDER — FLUTICASONE-SALMETEROL 250-50 MCG/DOSE IN AEPB
1.0000 | INHALATION_SPRAY | Freq: Two times a day (BID) | RESPIRATORY_TRACT | Status: DC
  Filled 2011-01-23: qty 14

## 2011-01-23 MED ORDER — ALBUTEROL SULFATE HFA 108 (90 BASE) MCG/ACT IN AERS
2.0000 | INHALATION_SPRAY | RESPIRATORY_TRACT | Status: DC
  Filled 2011-01-23: qty 6.7

## 2011-01-23 NOTE — Progress Notes (Signed)
Pt discharged home today per Dr. Sherrie Mustache. Pt's IV site D/C'd and WNL. Pt's VS stable at this time. Pt provided with home medication list, discharge instructions and prescriptions. Pt verbalized understanding. Pt left floor via WC accompanied by Holly Bodily, RN in stable condition.

## 2011-01-23 NOTE — Discharge Summary (Signed)
Physician Discharge Summary  [name] MRN: [id number] DOB/AGE: 1941/02/07 70 y.o.  PCP: Harlow Asa, MD, MD   Admit date: 01/20/2011 Discharge date: 01/23/2011  Discharge Diagnoses:   Principal Problem:  *Delirium Active Problems:  UTI (urinary tract infection)  Anxiety  Chronic abdominal pain  Hypothyroid  Fibromyalgia  GERD (gastroesophageal reflux disease)  HTN (hypertension), malignant  Sinusitis  Tinnitus  Hypokalemia  Abdominal lipoma  Bronchitis  Lumbar spondylosis 1.Delirium, secondary to medication side effects. 2. Urinary tract infection. 3. Left lower quadrant abdominal mass, secondary to lipoma, per CT scan of the abdomen and pelvis. 4. Recent history of sinusitis and tinnitus, not clinically evident during the hospitalization. 5. Hypertension, which became malignant during the hospitalization. 6. Chronic pain syndrome, remained stable. 7. Chronic anxiety/depression, remains stable. 8. Hypothyroidism, her TSH was slightly elevated at 6.6. An outpatient free T4 and followup TSH are recommended. 9. Radiographic findings of mucous plugging, with a history of chronic bronchitis. 10. Hypokalemia.   Current Discharge Medication List    START taking these medications   Details  albuterol (PROVENTIL HFA;VENTOLIN HFA) 108 (90 BASE) MCG/ACT inhaler Inhale 2 puffs into the lungs every 4 (four) hours.    cefUROXime (CEFTIN) 250 MG tablet Take 1 tablet (250 mg total) by mouth 2 (two) times daily. Qty: 6 tablet, Refills: 0    Fluticasone-Salmeterol (ADVAIR) 250-50 MCG/DOSE AEPB Inhale 1 puff into the lungs 2 (two) times daily. Qty: 60 each      CONTINUE these medications which have CHANGED   Details  lisinopril (PRINIVIL,ZESTRIL) 10 MG tablet TAKE 1 AND 1/2 TABLETS DAILY FOR YOUR BLOOD PRESSURE. Qty: 45 tablet, Refills: 1      CONTINUE these medications which have NOT CHANGED   Details  ALPRAZolam (XANAX) 1 MG tablet Take 0.5 mg by mouth 3 (three)  times daily as needed. Patient takes 0.5mg  three times a day during the day then 1&1/2 tablet at bedtime     citalopram (CELEXA) 20 MG tablet Take 20 mg by mouth daily.      HYDROcodone-acetaminophen (NORCO) 10-325 MG per tablet Take 1 tablet by mouth every 4 (four) hours as needed. Pain(max 4tabs per day)       levothyroxine (SYNTHROID, LEVOTHROID) 112 MCG tablet Take 112 mcg by mouth daily.      meclizine (ANTIVERT) 25 MG tablet Take 25 mg by mouth every 4 (four) hours as needed. dizziness     nortriptyline (PAMELOR) 50 MG capsule Take 50 mg by mouth at bedtime.      polyethylene glycol powder (MIRALAX) powder Take 17 g by mouth daily.      ranitidine (ZANTAC) 300 MG capsule Take 300 mg by mouth daily.        STOP taking these medications     gabapentin (NEURONTIN) 300 MG capsule      hydrOXYzine (ATARAX) 25 MG tablet      levofloxacin (LEVAQUIN) 500 MG tablet      predniSONE (DELTASONE) 20 MG tablet         Discharge Condition: Stable and improved.  Disposition: Home or Self Care   Consults: None.   Significant Diagnostic Studies: Ct Head Wo Contrast  01/20/2011  *RADIOLOGY REPORT*  Clinical Data: Altered mental status.  CT HEAD WITHOUT CONTRAST  Technique:  Contiguous axial images were obtained from the base of the skull through the vertex without contrast.  Comparison: None  Findings: No sign of skull fracture.  No evidence of intracranial hemorrhage.  The brain shows  areas of low density within the hemispheric white matter presumed represent chronic small vessel disease.  No acute infarction is discernible.  No mass lesion, hydrocephalus or extra-axial collection.  No fluid in the sinuses or mastoids.  IMPRESSION: No identifiably acute finding.  Scattered areas of low density within the hemispheric white matter presumed to represent chronic small vessel disease.  Original Report Authenticated By: Thomasenia Sales, M.D.   Ct Abdomen Pelvis W Contrast  01/21/2011   *RADIOLOGY REPORT*  Clinical Data: Left lower quadrant mass palpable on physical exam.  CT ABDOMEN AND PELVIS WITH CONTRAST  Technique:  Multidetector CT imaging of the abdomen and pelvis was performed following the standard protocol during bolus administration of intravenous contrast.  Contrast: OMNIPAQUE IOHEXOL 300 MG/ML IV SOLN  Comparison: 01/20/2011; 09/24/2010  Findings: Airway thickening and airway plugging noted in the left lower lobe with mild associated atelectasis.  Similar findings are present to a lesser degree in the lingula. Clips noted along the splenic hilum.  Stable postoperative appearance of the proximal stomach noted.  The gallbladder is surgically absent.  The liver, spleen, pancreas, and adrenal glands appear unremarkable.  The kidneys appear unremarkable, as do the proximal ureters.  Abdominal aortic atherosclerosis noted.  Lipoma of the left latissimus dorsi is again noted.  No pathologic retroperitoneal or porta hepatis adenopathy is identified.  No pathologic pelvic adenopathy is identified.  Urinary bladder appears unremarkable.  The uterus is absent and the ovaries are not visualized.  No pathologic retroperitoneal or porta hepatis adenopathy is identified.  There is exaggerated lumbar lordosis with thoracolumbar spondylosis and degenerative disc disease.  Loss of intervertebral disc height is most pronounced at L4-5 where there is a diffuse disc bulge. There also appears to be degenerative disc disease at the L1-2 level.  IMPRESSION:  1.  I do not observe a left lower quadrant mass to correspond with the reported palpable abnormality.  There is a lipoma of the inferior margin of the left latissimus dorsi; if the palpable mass is along the patient's left posterior lateral rib cage, then it likely represents this lipoma.  2.  Airway thickening and mild airway plugging at the left lung base. 3.  Atherosclerosis. 4.  Lumbar spondylosis and degenerative disc disease.  Original Report  Authenticated By: Dellia Cloud, M.D.   Dg Abd Acute W/chest  01/20/2011  *RADIOLOGY REPORT*  Clinical Data: Cough.  Abdominal pain.  ACUTE ABDOMEN SERIES (ABDOMEN 2 VIEW & CHEST 1 VIEW)  Comparison: 08/29/2010 and hospital chest x-ray.  09/24/2010 [facility] hospital abdominal CT.  Findings: Left base scarring versus atelectasis.  Peripheral aspect right lung atelectasis versus small amount of fluid in the fissure.  This can be addressed on follow-up.  No infiltrate, congestive heart failure or pneumothorax.  Calcified mildly tortuous aorta.  Heart size within normal limits. The  Nonspecific bowel gas pattern without plain film evidence of bowel obstruction or free intraperitoneal air.  The CT detected small bowel abnormality is not well appreciated on the present plain film exam.  Surgical clips.  IMPRESSION: No plain film evidence of obstruction or free intraperitoneal air. Please see above discussion.  Original Report Authenticated By: Fuller Canada, M.D.     Microbiology: Recent Results (from the past 240 hour(s))  URINE CULTURE     Status: Normal   Collection Time   01/20/11  5:45 PM      Component Value Range Status Comment   Specimen Description URINE, CLEAN CATCH   Final  Special Requests NONE   Final    Setup Time [id number]   Final    Colony Count 25,000 COLONIES/ML   Final    Culture     Final    Value: Multiple bacterial morphotypes present, none predominant. Suggest appropriate recollection if clinically indicated.   Report Status 01/23/2011 FINAL   Final      Labs: Results for orders placed during the hospital encounter of 01/20/11 (from the past 48 hour(s))  CBC     Status: Normal   Collection Time   01/22/11  5:29 AM      Component Value Range Comment   WBC 8.6  4.0 - 10.5 (K/uL)    RBC 4.68  3.87 - 5.11 (MIL/uL)    Hemoglobin 14.6  12.0 - 15.0 (g/dL)    HCT 81.1  91.4 - 78.2 (%)    MCV 93.4  78.0 - 100.0 (fL)    MCH 31.2  26.0 - 34.0 (pg)    MCHC 33.4   30.0 - 36.0 (g/dL)    RDW 95.6  21.3 - 08.6 (%)    Platelets 232  150 - 400 (K/uL)   BASIC METABOLIC PANEL     Status: Abnormal   Collection Time   01/22/11  5:29 AM      Component Value Range Comment   Sodium 136  135 - 145 (mEq/L)    Potassium 3.4 (*) 3.5 - 5.1 (mEq/L) DELTA CHECK NOTED   Chloride 102  96 - 112 (mEq/L)    CO2 26  19 - 32 (mEq/L)    Glucose, Bld 99  70 - 99 (mg/dL)    BUN 6  6 - 23 (mg/dL)    Creatinine, Ser 5.78 (*) 0.50 - 1.10 (mg/dL)    Calcium 8.7  8.4 - 10.5 (mg/dL)    GFR calc non Af Amer >60  >60 (mL/min)    GFR calc Af Amer >60  >60 (mL/min)      HPI : The patient is a 70 year old woman with a past medical history significant for COPD, fibromyalgia, chronic abdominal pain, and hypertension. She presented to the emergency department on 01/20/2011 with a report of confusion. She was reportedly started on Levaquin, gabapentin, and prednisone for tinnitus and sinusitis. A couple days following the initiation of these medications, she became confused as reported by her family. In the emergency department, she was noted to be hypertensive with a blood pressure of 190/81. Her lab data were significant for a WBC of 11.1, glucose of 122, and a urinalysis that revealed 11-20 WBCs, 3-6 red blood cells, and a few bacteria. The CT scan of her head revealed no acute intracranial findings, however, it did reveal chronic small vessel disease. She was admitted for further evaluation and management.  HOSPITAL COURSE: It appeared, that the patient's confusion, coincided with the institution of the new medications as mentioned above. These were all discontinued. She was started on Rocephin for an osseous urinary tract infection. Gentle IV fluids were started. She was maintained on most of her chronic medications, except, the dose of her hydrocodone was temporarily decreased. For further evaluation, a number of studies were ordered. As indicated above, the CT scan of her head was  unremarkable acutely. Her urine drug screen revealed benzodiazepines and opiates, both of which she takes chronically. Her urine culture eventually grew out 25,000 colonies of multiple bacteria. Her TSH was slightly elevated at 6.6. A free T4 was not ordered during the hospitalization, however, one can be ordered  in the outpatient setting as well as a followup TSH in 3-6 months. She was maintained on the same dosing of Synthroid.  Upon Dr. Pincus Sanes abdominal examination on admission, the patient was noted to have a left lower quadrant mass. Dr. Gerda Diss was asked to review the patient's medical history per her the office notes. There was no mention of an abdominal mass previously. For further evaluation, a CT scan of her abdomen and pelvis was ordered. The results were significant for a lipoma at the margin of the left latissimus dorsi. Also revealed mucous plugging in one of the bronchioles of the left lower lung and lumbar spondylosis. The patient was informed of the findings.  She was started on Advair inhaler and albuterol HFA for mucous plugging and her history of chronic bronchitis, although, the patient did not complain of shortness of breath or wheezing. On lung exam, she had no significant wheezes or bronchospasms.  The patient's blood pressure was noted to be malignant during the first few days of the hospitalization. She was maintained on lisinopril. However, the dose was increased from 10 mg daily to 15 mg daily. At the time of hospital discharge, her blood pressure improved significantly. It was 136/74 prior to hospital discharge.  The patient was initially confused on hospital day #1, however, the following days and the remainder of the hospitalization, she became alert and oriented. Her husband stated that she was back to herself. In my conversation with her and her husband, she admitted, that she may have taken too many of the medications described (prednisone, gabapentin, and Levaquin). It  is likely that her confusion was secondary to an unintentional misuse of her medications.  She was discharged to home in improved and stable condition. She was discharged on Ceftin twice a day for 3 more days as well as Advair and albuterol inhaler. She will followup with Dr.Luking next week.  Discharge Exam:  Blood pressure 136/74, pulse 67, temperature 98.5 F (36.9 C), temperature source Oral, resp. rate 18, height 5\' 5"  (1.651 m), weight 78.3 kg (172 lb 9.9 oz), SpO2 90.00%. General: The patient is sitting up in bed, in no acute distress. Lungs: Clear anteriorly with occasional crackles in the bases. Heart: S1-S2 with a soft systolic murmur. Abdomen: Positive bowel sounds, soft, nontender, and nondistended. Left lower quadrant mass palpated without appreciable tenderness. Extremities: Pedal pulses palpable. No pretibial edema and no pedal edema. Neurologic: The patient is alert and oriented x3. Cranial nerves II through XII are intact.    Discharge Orders    Future Orders Please Complete By Expires   Diet - low sodium heart healthy      Increase activity slowly         Follow-up Information    Follow up with Harlow Asa, MD on 01/29/2011. (AT 2:40 PM)    Contact information:   [address] [place] [address] [phone]          Signed: Cobie Leidner 01/23/2011, 11:59 AM

## 2011-02-08 LAB — BASIC METABOLIC PANEL
BUN: 8
BUN: 8
CO2: 24
CO2: 24
Chloride: 108
Chloride: 110
Creatinine, Ser: 0.64
Creatinine, Ser: 0.69
Creatinine, Ser: 0.84
GFR calc Af Amer: >60
GFR calc non Af Amer: >60
Potassium: 4.1
Potassium: 3.9

## 2011-02-08 LAB — CBC
HCT: 40.3
HCT: 34.6 — ABNORMAL LOW
MCHC: 33.7
MCV: 100.7 — ABNORMAL HIGH
Platelets: 299
Platelets: 201
RBC: 3.43 — ABNORMAL LOW
WBC: 10

## 2011-02-08 LAB — DIFFERENTIAL
Basophils Relative: 1
Eosinophils Absolute: 0.1
Eosinophils Relative: 2
Lymphocytes Relative: 24
Lymphs Abs: 2.4
Monocytes Relative: 8
Neutro Abs: 7
Neutrophils Relative %: 70
Neutrophils Relative %: 55

## 2011-02-08 LAB — HEPATIC FUNCTION PANEL
ALT: 27
Albumin: 3.3 — ABNORMAL LOW
Alkaline Phosphatase: 79
Total Protein: 6.2

## 2011-02-08 LAB — CLOSTRIDIUM DIFFICILE EIA

## 2011-02-16 LAB — COMPREHENSIVE METABOLIC PANEL WITH GFR
ALT: 16 U/L (ref 0–35)
AST: 20 U/L (ref 0–37)
Albumin: 3.4 g/dL — ABNORMAL LOW (ref 3.5–5.2)
Alkaline Phosphatase: 84 U/L (ref 39–117)
BUN: 9 mg/dL (ref 6–23)
CO2: 31 meq/L (ref 19–32)
Calcium: 9.4 mg/dL (ref 8.4–10.5)
Chloride: 104 meq/L (ref 96–112)
Creatinine, Ser: 0.79 mg/dL (ref 0.4–1.2)
GFR calc non Af Amer: >60 mL/min
Glucose, Bld: 77 mg/dL (ref 70–99)
Potassium: 4.8 meq/L (ref 3.5–5.1)
Sodium: 142 meq/L (ref 135–145)
Total Bilirubin: 0.4 mg/dL (ref 0.3–1.2)
Total Protein: 5.9 g/dL — ABNORMAL LOW (ref 6.0–8.3)

## 2011-02-16 LAB — BASIC METABOLIC PANEL
BUN: 9 mg/dL (ref 6–23)
Chloride: 110 mEq/L (ref 96–112)
Creatinine, Ser: 0.65 mg/dL (ref 0.4–1.2)
Glucose, Bld: 110 mg/dL — ABNORMAL HIGH (ref 70–99)
Potassium: 3.5 mEq/L (ref 3.5–5.1)

## 2011-02-16 LAB — URINE MICROSCOPIC-ADD ON

## 2011-02-16 LAB — URINALYSIS, ROUTINE W REFLEX MICROSCOPIC
Glucose, UA: NEGATIVE mg/dL
Hgb urine dipstick: NEGATIVE
Protein, ur: NEGATIVE mg/dL
Specific Gravity, Urine: 1.006 (ref 1.005–1.030)
pH: 6 (ref 5.0–8.0)

## 2011-02-16 LAB — DIFFERENTIAL
Basophils Absolute: 0.1 10*3/uL (ref 0.0–0.1)
Basophils Relative: 1 % (ref 0–1)
Eosinophils Absolute: 0.6 10*3/uL (ref 0.0–0.7)
Monocytes Absolute: 0.5 10*3/uL (ref 0.1–1.0)
Monocytes Relative: 7 % (ref 3–12)
Neutro Abs: 4.2 10*3/uL (ref 1.7–7.7)
Neutrophils Relative %: 51 % (ref 43–77)

## 2011-02-16 LAB — CBC
HCT: 34.5 % — ABNORMAL LOW (ref 36.0–46.0)
HCT: 39.7 % (ref 36.0–46.0)
Hemoglobin: 11.5 g/dL — ABNORMAL LOW (ref 12.0–15.0)
Hemoglobin: 13.1 g/dL (ref 12.0–15.0)
MCHC: 33.4 g/dL (ref 30.0–36.0)
RBC: 3.49 MIL/uL — ABNORMAL LOW (ref 3.87–5.11)
RBC: 3.98 MIL/uL (ref 3.87–5.11)
RDW: 14.3 % (ref 11.5–15.5)
WBC: 8.1 10*3/uL (ref 4.0–10.5)

## 2011-02-16 LAB — APTT: aPTT: 26 s (ref 24–37)

## 2011-02-16 LAB — PROTIME-INR: INR: 0.9 (ref 0.00–1.49)

## 2011-03-31 ENCOUNTER — Other Ambulatory Visit: Payer: Self-pay | Admitting: Internal Medicine

## 2011-07-23 DIAGNOSIS — R1084 Generalized abdominal pain: Secondary | ICD-10-CM | POA: Diagnosis not present

## 2011-07-23 DIAGNOSIS — J40 Bronchitis, not specified as acute or chronic: Secondary | ICD-10-CM | POA: Diagnosis not present

## 2011-07-23 DIAGNOSIS — J019 Acute sinusitis, unspecified: Secondary | ICD-10-CM | POA: Diagnosis not present

## 2011-09-13 DIAGNOSIS — I10 Essential (primary) hypertension: Secondary | ICD-10-CM | POA: Diagnosis not present

## 2011-09-13 DIAGNOSIS — J019 Acute sinusitis, unspecified: Secondary | ICD-10-CM | POA: Diagnosis not present

## 2011-09-13 DIAGNOSIS — E039 Hypothyroidism, unspecified: Secondary | ICD-10-CM | POA: Diagnosis not present

## 2011-09-13 DIAGNOSIS — G8929 Other chronic pain: Secondary | ICD-10-CM | POA: Diagnosis not present

## 2011-12-20 ENCOUNTER — Other Ambulatory Visit: Payer: Self-pay | Admitting: Family Medicine

## 2011-12-20 ENCOUNTER — Ambulatory Visit (HOSPITAL_COMMUNITY)
Admission: RE | Admit: 2011-12-20 | Discharge: 2011-12-20 | Disposition: A | Discharge status: Home / Self Care | Payer: Medicare Other | Source: Ambulatory Visit | Attending: Family Medicine | Admitting: Family Medicine

## 2011-12-20 DIAGNOSIS — R4182 Altered mental status, unspecified: Secondary | ICD-10-CM | POA: Diagnosis not present

## 2011-12-20 DIAGNOSIS — I6350 Cerebral infarction due to unspecified occlusion or stenosis of unspecified cerebral artery: Secondary | ICD-10-CM | POA: Insufficient documentation

## 2011-12-20 DIAGNOSIS — I639 Cerebral infarction, unspecified: Secondary | ICD-10-CM

## 2011-12-20 DIAGNOSIS — R4701 Aphasia: Secondary | ICD-10-CM | POA: Diagnosis not present

## 2011-12-20 DIAGNOSIS — R4789 Other speech disturbances: Secondary | ICD-10-CM | POA: Diagnosis not present

## 2011-12-20 DIAGNOSIS — R51 Headache: Secondary | ICD-10-CM | POA: Diagnosis not present

## 2011-12-20 DIAGNOSIS — I6789 Other cerebrovascular disease: Secondary | ICD-10-CM | POA: Diagnosis not present

## 2011-12-21 ENCOUNTER — Other Ambulatory Visit (HOSPITAL_COMMUNITY): Payer: Medicare Other

## 2011-12-24 DIAGNOSIS — Z79899 Other long term (current) drug therapy: Secondary | ICD-10-CM | POA: Diagnosis not present

## 2011-12-24 DIAGNOSIS — R5381 Other malaise: Secondary | ICD-10-CM | POA: Diagnosis not present

## 2011-12-24 DIAGNOSIS — I10 Essential (primary) hypertension: Secondary | ICD-10-CM | POA: Diagnosis not present

## 2011-12-24 DIAGNOSIS — R5383 Other fatigue: Secondary | ICD-10-CM | POA: Diagnosis not present

## 2011-12-25 ENCOUNTER — Ambulatory Visit (HOSPITAL_COMMUNITY)
Admission: RE | Admit: 2011-12-25 | Discharge: 2011-12-25 | Disposition: A | Discharge status: Home / Self Care | Payer: Medicare Other | Source: Ambulatory Visit | Attending: Family Medicine | Admitting: Family Medicine

## 2011-12-25 DIAGNOSIS — I6320 Cerebral infarction due to unspecified occlusion or stenosis of unspecified precerebral arteries: Secondary | ICD-10-CM | POA: Diagnosis not present

## 2011-12-25 DIAGNOSIS — I639 Cerebral infarction, unspecified: Secondary | ICD-10-CM

## 2011-12-25 DIAGNOSIS — I6350 Cerebral infarction due to unspecified occlusion or stenosis of unspecified cerebral artery: Secondary | ICD-10-CM | POA: Insufficient documentation

## 2012-01-02 DIAGNOSIS — I959 Hypotension, unspecified: Secondary | ICD-10-CM | POA: Diagnosis not present

## 2012-01-02 DIAGNOSIS — I6789 Other cerebrovascular disease: Secondary | ICD-10-CM | POA: Diagnosis not present

## 2012-01-03 ENCOUNTER — Ambulatory Visit (HOSPITAL_COMMUNITY)
Admission: RE | Admit: 2012-01-03 | Discharge: 2012-01-03 | Disposition: A | Discharge status: Home / Self Care | Payer: Medicare Other | Source: Ambulatory Visit | Attending: Family Medicine | Admitting: Family Medicine

## 2012-01-03 DIAGNOSIS — IMO0001 Reserved for inherently not codable concepts without codable children: Secondary | ICD-10-CM | POA: Insufficient documentation

## 2012-01-03 DIAGNOSIS — I69920 Aphasia following unspecified cerebrovascular disease: Secondary | ICD-10-CM | POA: Diagnosis not present

## 2012-01-03 DIAGNOSIS — I69320 Aphasia following cerebral infarction: Secondary | ICD-10-CM | POA: Insufficient documentation

## 2012-01-03 NOTE — Evaluation (Signed)
Speech Language Pathology Evaluation Patient Details  Name: [name] MRN: [id number] Date of Birth: Feb 02, 1941  Today's Date: 01/03/2012 Time: 1400-1500 SLP Time Calculation (min): 60 min  Authorization: Medicare  Authorization Time Period: 01/03/2012-01/31/2012  Authorization Visit#:   of     Past Medical History:  Past Medical History  Diagnosis Date  . Anxiety   . Hypothyroidism   . Hypertension   . Lumbar spondylosis 01/23/2011   Past Surgical History: No past surgical history on file.  HPI:  Symptoms/Limitations Symptoms: "I had a stroke." Special Tests: informal measures and portions of the Western Aphasia Battery (WAB) Pain Assessment Currently in Pain?: No/denies Multiple Pain Sites: No  Prior Functional Status  Cognitive/Linguistic Baseline: Within functional limits Type of Home: Mobile home Lives With: Spouse (her brother also lives with them) Available Help at Discharge: Family Education: graduated high school Vocation: Retired  IT consultant  Overall Cognitive Status: Appears within functional limits for tasks assessed Arousal/Alertness: Awake/alert Orientation Level: Oriented X4 Memory: Appears intact Awareness: Impaired Awareness Impairment: Other (comment) (unaware of paraphasic errors at times) Problem Solving: Appears intact Safety/Judgment: Appears intact  Comprehension  Auditory Comprehension Overall Auditory Comprehension: Impaired Yes/No Questions: Within Functional Limits Commands: Impaired Complex Commands: 50-74% accurate Conversation: Complex Other Conversation Comments: needs some repetition Interfering Components: Motor planning EffectiveTechniques: Extra processing time;Repetition Visual Recognition/Discrimination Discrimination: Within Function Limits Reading Comprehension Reading Status: Impaired Word level: Within functional limits Sentence Level: Within functional limits Paragraph Level: Impaired Functional  Environmental (signs, name badge): Within functional limits Effective Techniques: Verbal cueing  Expression  Expression Primary Mode of Expression: Verbal Verbal Expression Overall Verbal Expression: Impaired Initiation: No impairment Automatic Speech: Month of year;Day of week Level of Generative/Spontaneous Verbalization: Sentence;Conversation Repetition: Impaired Level of Impairment: Phrase level Naming: Impairment Responsive: 76-100% accurate Confrontation: Impaired Convergent: Not tested Divergent: Not tested Verbal Errors: Phonemic paraphasias;Neologisms;Not aware of errors;Aware of errors Pragmatics: No impairment Effective Techniques: Open ended questions;Phonemic cues;Written cues;Articulatory cues Non-Verbal Means of Communication: Not applicable Written Expression Written Expression: Not tested  Oral/Motor  Oral Motor/Sensory Function Overall Oral Motor/Sensory Function: Appears within functional limits for tasks assessed Motor Speech Overall Motor Speech: Impaired Respiration: Within functional limits Phonation: Normal Resonance: Within functional limits Articulation: Within functional limitis Motor Planning: Impaired Level of Impairment: Sentence Motor Speech Errors: Aware;Unaware;Inconsistent  SLP Goals  Home Exercise SLP Goal: Patient will Perform Home Exercise Program: Independently SLP Short Term Goals SLP Short Term Goal 1: Pt will repeat single bisyllabic words x5 each with 80% acc and mod cues. SLP Short Term Goal 2: Pt will orally read 6-8 word sentences aloud with 90% acc and cue for error awareness. SLP Short Term Goal 3: Pt will complete convergent and divergent moderate level naming tasks with 80% acc and mod cues. SLP Short Term Goal 4: Pt will formulate grammatically correct 5-7 word sentences when describing pictures with mod/max cues. SLP Long Term Goals SLP Long Term Goal 1: Pt will express complex thoughts and needs to [facility] with use of  compensatory strategies as needed.  SLP Long Term Goal 2: Pt will self correct paraphasic errors 95% of the time in conversation.  Assessment/Plan  Patient Active Problem List  Diagnosis  . Delirium  . UTI (urinary tract infection)  . Anxiety  . Chronic abdominal pain  . Hypothyroid  . Fibromyalgia  . GERD (gastroesophageal reflux disease)  . HTN (hypertension), malignant  . Sinusitis  . Tinnitus  . Hypokalemia  . Abdominal lipoma  . Bronchitis  . Lumbar  spondylosis  . Aphasia due to recent cerebral infarction   SLP - End of Session Activity Tolerance: Patient tolerated treatment well General Behavior During Session: [facility] for tasks performed Cognition: [facility] for tasks performed  SLP Assessment/Plan Clinical Impression Statement: Mild/Moderate expressive aphasia and mild receptive aphasia negatively impacting her ability to express wants/needs and follow complex directions. Pt would benefit from skilled speech/language services in the outpatient setting to maximize functional independence in communication. Speech Therapy Frequency: min 2x/week Duration: 4 weeks Treatment/Interventions: Cueing hierarchy;Compensatory strategies;SLP instruction and feedback;Patient/family education Potential to Achieve Goals: Good Potential Considerations:  (Pt may have difficulty getting a ride here 2x/week)  GN Functional Assessment Tool Used: clinical judgement Functional Limitations: Spoken language expressive Spoken Language Expression Current Status [phone]): At least 20 percent but less than 40 percent impaired, limited or restricted Spoken Language Expression Goal Status [phone]): At least 1 percent but less than 20 percent impaired, limited or restricted  Thank you,  Havery Moros, CCC-SLP [id number]  PORTER,DABNEY 01/03/2012, 5:33 PM  Physician Documentation Your signature is required to indicate approval of the treatment plan as stated above.  Please sign and either send  electronically or make a copy of this report for your files and return this physician signed original.  Please mark one 1.__approve of plan  2. ___approve of plan with the following conditions.   ______________________________                                                          _____________________ Physician Signature                                                                                                             Date

## 2012-01-07 ENCOUNTER — Ambulatory Visit (HOSPITAL_COMMUNITY)
Admission: RE | Admit: 2012-01-07 | Discharge: 2012-01-07 | Disposition: A | Discharge status: Home / Self Care | Payer: Medicare Other | Source: Ambulatory Visit | Attending: Family Medicine | Admitting: Family Medicine

## 2012-01-07 DIAGNOSIS — I69320 Aphasia following cerebral infarction: Secondary | ICD-10-CM

## 2012-01-07 NOTE — Progress Notes (Signed)
Speech Language Pathology Treatment Patient Details  Name: DOSSIE OCANAS MRN: [id number] Date of Birth: 1940/11/10  Today's Date: 01/07/2012 Time: 1117-1207 SLP Time Calculation (min): 50 min  Authorization: Medicare  Authorization Time Period: 01/03/2012-01/31/2012  Authorization Visit#:  2 of  8   HPI:  Symptoms/Limitations Symptoms: "I'm doing a little better." Pain Assessment Currently in Pain?: No/denies Multiple Pain Sites: No   Treatment  Expressive Language Therapy Aphasia Therapy Patient/Family Education Home Exercise Program  SLP Goals  Home Exercise SLP Goal: Patient will Perform Home Exercise Program: Independently SLP Goal: Perform Home Exercise Program - Progress: Progressing toward goal SLP Short Term Goals SLP Short Term Goal 1: Pt will repeat single bisyllabic words x5 each with 80% acc and mod cues. SLP Short Term Goal 1 - Progress: Progressing toward goal SLP Short Term Goal 2: Pt will orally read 6-8 word sentences aloud with 90% acc and cue for error awareness. SLP Short Term Goal 2 - Progress: Progressing toward goal SLP Short Term Goal 3: Pt will complete convergent and divergent moderate level naming tasks with 80% acc and mod cues. SLP Short Term Goal 3 - Progress: Progressing toward goal SLP Short Term Goal 4: Pt will formulate grammatically correct 5-7 word sentences when describing pictures with mod/max cues. SLP Short Term Goal 4 - Progress: Progressing toward goal SLP Short Term Goal 5: Pt will write 3-4 sentences to dictation with 90% acc and repetition prn. SLP Short Term Goal 5 - Progress: Progressing toward goal SLP Long Term Goals SLP Long Term Goal 1: Pt will express complex thoughts and needs to [facility] with use of compensatory strategies as needed.  SLP Long Term Goal 1 - Progress: Progressing toward goal SLP Long Term Goal 2: Pt will self correct paraphasic errors 95% of the time in conversation. SLP Long Term Goal 2 - Progress:  Progressing toward goal  Assessment/Plan  Patient Active Problem List  Diagnosis  . Delirium  . UTI (urinary tract infection)  . Anxiety  . Chronic abdominal pain  . Hypothyroid  . Fibromyalgia  . GERD (gastroesophageal reflux disease)  . HTN (hypertension), malignant  . Sinusitis  . Tinnitus  . Hypokalemia  . Abdominal lipoma  . Bronchitis  . Lumbar spondylosis  . Aphasia due to recent cerebral infarction   SLP - End of Session Activity Tolerance: Patient tolerated treatment well General Behavior During Session: [facility] for tasks performed Cognition: [facility] for tasks performed  SLP Assessment/Plan Clinical Impression Statement: Mrs. Norby completed her homework and wanted to review the aphasia and apraxia material I gave her last session. She was able to name objects in picture with 83% acc (paraphasic errors only). Her reading and writing is a strength and she was able to use written cues to improve accuracy. She repeated monosyllabic words with 100% acc, but had more difficulty with bisyllabic words and sentences. It was very challenging for her to create a short sentence when looking at pictures. Some weeks she will only be able to attend 1x/week due to her husband's work schedule so she will need a lot of material to work on at home. Speech Therapy Frequency: min 2x/week Duration: 4 weeks Treatment/Interventions: Cueing hierarchy;Compensatory strategies;SLP instruction and feedback;Patient/family education Potential to Achieve Goals: Good Potential Considerations:  (Pt may have difficulty getting a ride here 2x/week)  GN Functional Assessment Tool Used: clinical judgement Functional Limitations: Spoken language expressive Spoken Language Expression Current Status [phone]): At least 20 percent but less than 40 percent impaired, limited or  restricted Spoken Language Expression Goal Status [phone]): At least 1 percent but less than 20 percent impaired, limited or  restricted  Aliene Tamura 01/07/2012, 1:41 PM

## 2012-01-08 ENCOUNTER — Ambulatory Visit (HOSPITAL_COMMUNITY)
Admission: RE | Admit: 2012-01-08 | Discharge: 2012-01-08 | Disposition: A | Discharge status: Home / Self Care | Payer: Medicare Other | Source: Ambulatory Visit | Attending: Family Medicine | Admitting: Family Medicine

## 2012-01-08 DIAGNOSIS — I69320 Aphasia following cerebral infarction: Secondary | ICD-10-CM

## 2012-01-08 NOTE — Progress Notes (Signed)
Speech Language Pathology Treatment Patient Details  Name: [name] MRN: [id number] Date of Birth: May 02, 1941  Today's Date: 01/08/2012 Time: 1117-1204 SLP Time Calculation (min): 47 min  Authorization: Medicare  Authorization Time Period: 01/03/2012-01/31/2012  Authorization Visit#:  3 of  8   HPI:  Symptoms/Limitations Symptoms: "I'm fine." Pain Assessment Currently in Pain?: No/denies Multiple Pain Sites: No   Treatment  Apraxia Therapy Aphasia Therapy Patient/Family Education Home Exercise Program  SLP Goals  Home Exercise SLP Goal: Patient will Perform Home Exercise Program: Independently SLP Goal: Perform Home Exercise Program - Progress: Progressing toward goal SLP Short Term Goals SLP Short Term Goal 1: Pt will repeat single bisyllabic words x5 each with 80% acc and mod cues. SLP Short Term Goal 1 - Progress: Progressing toward goal SLP Short Term Goal 2: Pt will orally read 6-8 word sentences aloud with 90% acc and cue for error awareness. SLP Short Term Goal 2 - Progress: Progressing toward goal SLP Short Term Goal 3: Pt will complete convergent and divergent moderate level naming tasks with 80% acc and mod cues. SLP Short Term Goal 3 - Progress: Progressing toward goal SLP Short Term Goal 4: Pt will formulate grammatically correct 5-7 word sentences when describing pictures with mod/max cues. SLP Short Term Goal 4 - Progress: Progressing toward goal SLP Short Term Goal 5: Pt will write 3-4 sentences to dictation with 90% acc and repetition prn. SLP Short Term Goal 5 - Progress: Progressing toward goal SLP Long Term Goals SLP Long Term Goal 1: Pt will express complex thoughts and needs to [facility] with use of compensatory strategies as needed.  SLP Long Term Goal 1 - Progress: Progressing toward goal SLP Long Term Goal 2: Pt will self correct paraphasic errors 95% of the time in conversation. SLP Long Term Goal 2 - Progress: Progressing toward  goal  Assessment/Plan  Patient Active Problem List  Diagnosis  . Delirium  . UTI (urinary tract infection)  . Anxiety  . Chronic abdominal pain  . Hypothyroid  . Fibromyalgia  . GERD (gastroesophageal reflux disease)  . HTN (hypertension), malignant  . Sinusitis  . Tinnitus  . Hypokalemia  . Abdominal lipoma  . Bronchitis  . Lumbar spondylosis  . Aphasia due to recent cerebral infarction   SLP - End of Session Activity Tolerance: Patient tolerated treatment well General Behavior During Session: [facility] for tasks performed Cognition: [facility] for tasks performed  SLP Assessment/Plan Clinical Impression Statement: [name] had a great therapy session today and was agreeable to having an OT student observe our session. She was able to tell her what happened to her with min assist. She was cued to write down words she was having a difficult time saying. She completed single word repetition tasks, 3-4 word sentence repetition tasks (mod assist), and picture description tasks with mod cues. She was given several pages of homework since I won't be seeing her until Monday. Speech Therapy Frequency: min 2x/week Duration: 4 weeks Treatment/Interventions: Cueing hierarchy;Compensatory strategies;SLP instruction and feedback;Patient/family education Potential to Achieve Goals: Good Potential Considerations:  (Pt may have difficulty getting a ride here 2x/week)  GN Functional Assessment Tool Used: clinical judgement Functional Limitations: Spoken language expressive Spoken Language Expression Current Status [phone]): At least 20 percent but less than 40 percent impaired, limited or restricted Spoken Language Expression Goal Status [phone]): At least 1 percent but less than 20 percent impaired, limited or restricted  Kathi Dohn 01/08/2012, 12:17 PM

## 2012-01-17 ENCOUNTER — Ambulatory Visit (HOSPITAL_COMMUNITY): Payer: Medicare Other | Admitting: Speech Pathology

## 2012-01-17 DIAGNOSIS — R4701 Aphasia: Secondary | ICD-10-CM | POA: Diagnosis not present

## 2012-01-17 DIAGNOSIS — I10 Essential (primary) hypertension: Secondary | ICD-10-CM | POA: Diagnosis not present

## 2012-01-17 DIAGNOSIS — IMO0001 Reserved for inherently not codable concepts without codable children: Secondary | ICD-10-CM | POA: Diagnosis not present

## 2012-01-17 DIAGNOSIS — F341 Dysthymic disorder: Secondary | ICD-10-CM | POA: Diagnosis not present

## 2012-01-21 ENCOUNTER — Ambulatory Visit (HOSPITAL_COMMUNITY)
Admission: RE | Admit: 2012-01-21 | Discharge: 2012-01-21 | Disposition: A | Discharge status: Home / Self Care | Payer: Medicare Other | Source: Ambulatory Visit | Attending: Family Medicine | Admitting: Family Medicine

## 2012-01-21 DIAGNOSIS — I69320 Aphasia following cerebral infarction: Secondary | ICD-10-CM

## 2012-01-21 DIAGNOSIS — I69920 Aphasia following unspecified cerebrovascular disease: Secondary | ICD-10-CM | POA: Insufficient documentation

## 2012-01-21 DIAGNOSIS — IMO0001 Reserved for inherently not codable concepts without codable children: Secondary | ICD-10-CM | POA: Diagnosis not present

## 2012-01-21 NOTE — Progress Notes (Signed)
Speech Language Pathology Treatment Patient Details  Name: [name] MRN: [id number] Date of Birth: 12-16-1940  Today's Date: 01/21/2012 Time: 1120-1203 SLP Time Calculation (min): 43 min  Authorization: Medicare  Authorization Time Period: 01/03/2012-01/31/2012  Authorization Visit#:  4 of  8   HPI:  Symptoms/Limitations Symptoms: "I reverse things." Pain Assessment Currently in Pain?: No/denies Multiple Pain Sites: No   Treatment  Apraxia Therapy Aphasia Therapy Patient/Family Education Home Exercise Program  SLP Goals  Home Exercise SLP Goal: Patient will Perform Home Exercise Program: Independently SLP Goal: Perform Home Exercise Program - Progress: Progressing toward goal SLP Short Term Goals SLP Short Term Goal 1: Pt will repeat single bisyllabic words x5 each with 80% acc and mod cues. SLP Short Term Goal 1 - Progress: Progressing toward goal SLP Short Term Goal 2: Pt will orally read 6-8 word sentences aloud with 90% acc and cue for error awareness. SLP Short Term Goal 2 - Progress: Progressing toward goal SLP Short Term Goal 3: Pt will complete convergent and divergent moderate level naming tasks with 80% acc and mod cues. SLP Short Term Goal 3 - Progress: Progressing toward goal SLP Short Term Goal 4: Pt will formulate grammatically correct 5-7 word sentences when describing pictures with mod/max cues. SLP Short Term Goal 4 - Progress: Progressing toward goal SLP Short Term Goal 5: Pt will write 3-4 sentences to dictation with 90% acc and repetition prn. SLP Short Term Goal 5 - Progress: Progressing toward goal SLP Long Term Goals SLP Long Term Goal 1: Pt will express complex thoughts and needs to [facility] with use of compensatory strategies as needed.  SLP Long Term Goal 1 - Progress: Progressing toward goal SLP Long Term Goal 2: Pt will self correct paraphasic errors 95% of the time in conversation. SLP Long Term Goal 2 - Progress: Progressing toward  goal  Assessment/Plan  Patient Active Problem List  Diagnosis  . Delirium  . UTI (urinary tract infection)  . Anxiety  . Chronic abdominal pain  . Hypothyroid  . Fibromyalgia  . GERD (gastroesophageal reflux disease)  . HTN (hypertension), malignant  . Sinusitis  . Tinnitus  . Hypokalemia  . Abdominal lipoma  . Bronchitis  . Lumbar spondylosis  . Aphasia due to recent cerebral infarction   SLP - End of Session Activity Tolerance: Patient tolerated treatment well General Behavior During Session: [facility] for tasks performed Cognition: [facility] for tasks performed  SLP Assessment/Plan Clinical Impression Statement: [name] missed therapy last week because she had an appointment with the eye doctor. She has some deficits on the right side, but no further follow up per patient. She continues to make good progress and her difficulties seem more consistent with apraxia and less aphasia. She repeated multisyllabic words with 78% acc and mod assist, 3 word sentences with 94% acc and min assist, single words sentence completion (SWSC) tasks with 100% min assist, category naming with 100% min assist, state the function with 100% min assist, and naming 3 items to category with 100% acc. Next session will focus on sentence generation and picture description tasks. She was given naming tasks for homework to complete with someone else.  Speech Therapy Frequency: min 2x/week Duration: 4 weeks Treatment/Interventions: Cueing hierarchy;Compensatory strategies;SLP instruction and feedback;Patient/family education Potential to Achieve Goals: Good Potential Considerations:  (Pt may have difficulty getting a ride here 2x/week)  GN Functional Assessment Tool Used: clinical judgement Functional Limitations: Spoken language expressive Spoken Language Expression Current Status [phone]): At least 20 percent  but less than 40 percent impaired, limited or restricted Spoken Language Expression Goal Status  [phone]): At least 1 percent but less than 20 percent impaired, limited or restricted  [name] 01/21/2012, 12:18 PM

## 2012-01-22 ENCOUNTER — Ambulatory Visit (HOSPITAL_COMMUNITY)
Admission: RE | Admit: 2012-01-22 | Discharge: 2012-01-22 | Disposition: A | Discharge status: Home / Self Care | Payer: Medicare Other | Source: Ambulatory Visit | Attending: Family Medicine | Admitting: Family Medicine

## 2012-01-22 DIAGNOSIS — I69320 Aphasia following cerebral infarction: Secondary | ICD-10-CM

## 2012-01-22 NOTE — Progress Notes (Signed)
Speech Language Pathology Treatment Patient Details  Name: [name] MRN: [id number] Date of Birth: 12-06-1940  Today's Date: 01/22/2012 Time: 1100-1150 SLP Time Calculation (min): 50 min  Authorization: Medicare  Authorization Time Period: 01/03/2012-01/31/2012  Authorization Visit#:  5 of  8  HPI:  Symptoms/Limitations Symptoms: "How are you?" Pain Assessment Currently in Pain?: No/denies Multiple Pain Sites: No   Treatment  Apraxia Therapy Aphasia Therapy Patient/Family Education Home Exercise Program  SLP Goals  Home Exercise SLP Goal: Patient will Perform Home Exercise Program: Independently SLP Goal: Perform Home Exercise Program - Progress: Progressing toward goal SLP Short Term Goals SLP Short Term Goal 1: Pt will repeat single bisyllabic words x5 each with 80% acc and mod cues. SLP Short Term Goal 1 - Progress: Progressing toward goal SLP Short Term Goal 2: Pt will orally read 6-8 word sentences aloud with 90% acc and cue for error awareness. SLP Short Term Goal 2 - Progress: Progressing toward goal SLP Short Term Goal 3: Pt will complete convergent and divergent moderate level naming tasks with 80% acc and mod cues. SLP Short Term Goal 3 - Progress: Progressing toward goal SLP Short Term Goal 4: Pt will formulate grammatically correct 5-7 word sentences when describing pictures with mod/max cues. SLP Short Term Goal 4 - Progress: Progressing toward goal SLP Short Term Goal 5: Pt will write 3-4 sentences to dictation with 90% acc and repetition prn. SLP Short Term Goal 5 - Progress: Progressing toward goal SLP Long Term Goals SLP Long Term Goal 1: Pt will express complex thoughts and needs to [facility] with use of compensatory strategies as needed.  SLP Long Term Goal 1 - Progress: Progressing toward goal SLP Long Term Goal 2: Pt will self correct paraphasic errors 95% of the time in conversation. SLP Long Term Goal 2 - Progress: Progressing toward  goal  Assessment/Plan  Patient Active Problem List  Diagnosis  . Delirium  . UTI (urinary tract infection)  . Anxiety  . Chronic abdominal pain  . Hypothyroid  . Fibromyalgia  . GERD (gastroesophageal reflux disease)  . HTN (hypertension), malignant  . Sinusitis  . Tinnitus  . Hypokalemia  . Abdominal lipoma  . Bronchitis  . Lumbar spondylosis  . Aphasia due to recent cerebral infarction   SLP - End of Session Activity Tolerance: Patient tolerated treatment well General Behavior During Session: [facility] for tasks performed Cognition: [facility] for tasks performed  SLP Assessment/Plan Clinical Impression Statement: Great session today. We began the session with a repetition of what we started with yesterday and she made improvements. She repeated 3-word phrases with 90% accuracy and multisyllabic words with 92%. She was able to state the function of the object with 100% acc and min assist when only giving a single word response. She needed moderate cues to provide a short sentence about the function of an occupation. She had the most difficulty generating a sentence from a given word and then writing the sentence. Speech Therapy Frequency: min 2x/week Duration: 4 weeks Treatment/Interventions: Cueing hierarchy;Compensatory strategies;SLP instruction and feedback;Patient/family education Potential to Achieve Goals: Good Potential Considerations:  (Pt may have difficulty getting a ride here 2x/week)  GN Functional Assessment Tool Used: clinical judgement Functional Limitations: Spoken language expressive Spoken Language Expression Current Status [phone]): At least 20 percent but less than 40 percent impaired, limited or restricted Spoken Language Expression Goal Status [phone]): At least 1 percent but less than 20 percent impaired, limited or restricted  Merrick Maggio 01/22/2012, 12:16 PM

## 2012-01-31 ENCOUNTER — Ambulatory Visit (HOSPITAL_COMMUNITY)
Admission: RE | Admit: 2012-01-31 | Discharge: 2012-01-31 | Disposition: A | Discharge status: Home / Self Care | Payer: Medicare Other | Source: Ambulatory Visit | Attending: Family Medicine | Admitting: Family Medicine

## 2012-01-31 DIAGNOSIS — I69320 Aphasia following cerebral infarction: Secondary | ICD-10-CM

## 2012-01-31 NOTE — Progress Notes (Signed)
Speech Language Pathology Treatment Patient Details  Name: [name] MRN: [id number] Date of Birth: 1941/03/20  Today's Date: 01/31/2012 Time: 1115-1200 SLP Time Calculation (min): 45 min  Authorization: Medicare  Authorization Time Period: 01/03/2012- 01/31/2012  Authorization Visit#:  6 of  8   HPI:  Symptoms/Limitations Symptoms: "We had a funeral." Pain Assessment Currently in Pain?: No/denies Multiple Pain Sites: No   Treatment  Apraxia Therapy Aphasia Therapy Patient/Family Education Home Exercise Program  SLP Goals  Home Exercise SLP Goal: Patient will Perform Home Exercise Program: Independently SLP Short Term Goals SLP Short Term Goal 1: Pt will repeat single bisyllabic words x5 each with 80% acc and mod cues. SLP Short Term Goal 1 - Progress: Met (change to 3 syllable words) SLP Short Term Goal 2: Pt will orally read 6-8 word sentences aloud with 90% acc and cue for error awareness. SLP Short Term Goal 2 - Progress: Partly met (Continue goal) SLP Short Term Goal 3: Pt will complete convergent and divergent moderate level naming tasks with 80% acc and mod cues. SLP Short Term Goal 3 - Progress: Met (change to 90% acc mi/mod cues) SLP Short Term Goal 4: Pt will formulate grammatically correct 5-7 word sentences when describing pictures with mod/max cues. SLP Short Term Goal 4 - Progress: Met (change to mod cues) SLP Short Term Goal 5: Pt will write 3-4 sentences to dictation with 90% acc and repetition prn. SLP Short Term Goal 5 - Progress: Partly met (Continue goal) Additional SLP Short Term Goals?: Yes SLP Short Term Goal 6: NEW GOAL 9/19: Pt will attempt to self correct errors in conversational speech 90% of the time without cues. SLP Long Term Goals SLP Long Term Goal 1: Pt will express complex thoughts and needs to [facility] with use of compensatory strategies as needed.  SLP Long Term Goal 1 - Progress: Progressing toward goal SLP Long Term Goal 2: Pt will  self correct paraphasic errors 95% of the time in conversation. SLP Long Term Goal 2 - Progress: Progressing toward goal  Assessment/Plan  Patient Active Problem List  Diagnosis  . Delirium  . UTI (urinary tract infection)  . Anxiety  . Chronic abdominal pain  . Hypothyroid  . Fibromyalgia  . GERD (gastroesophageal reflux disease)  . HTN (hypertension), malignant  . Sinusitis  . Tinnitus  . Hypokalemia  . Abdominal lipoma  . Bronchitis  . Lumbar spondylosis  . Aphasia due to recent cerebral infarction   SLP - End of Session Activity Tolerance: Patient tolerated treatment well General Behavior During Session: [facility] for tasks performed Cognition: [facility] for tasks performed  SLP Assessment/Plan Clinical Impression Statement: Great session again today. Mrs. Teichman has met most of her short term goals over the last month. She was unable to attend 2x every week due to her husband's work schedule for transportation. She was able to read 4-5 syllable words with at least an approximation (usually error in intonation and/or one syllable) and repeat the words with a model provided with improved accuracy (90% completely accurate). She read 2-syllable words aloud with 95% acc and also demonstrated improved awareness of apraxic errors by self correcting. She stated the opposite of basic level words with 100% and mi/mod cues, provided description/definition of nouns with ~70% acc, and named to description with 100% acc and min cues. Her greatest difficulty continues to be expressing herself in sentence length material rather than giving a couple of short words. Recommend an additional 4 weeks of therapy with 2x/  week. Speech Therapy Frequency: min 2x/week Duration: 4 weeks Treatment/Interventions: Cueing hierarchy;Compensatory strategies;SLP instruction and feedback;Patient/family education Potential to Achieve Goals: Good Potential Considerations:  (Pt may have difficulty getting a ride here  2x/week)   Thank you,  Havery Moros, CCC-SLP [phone]  PORTER,DABNEY 01/31/2012, 12:27 PM                                                     Physician Treatment Plan  Your signature is required to indicate approval of the treatment plan/progress as stated above. Please make a copy of this report for your files and return this physician signed original in the self-addressed envelope or fax to [phone]. COMMENTS/CHANGES:__________________________________________________________________________________________________________________________   ____________________________________                      ____________________ Benjamin Stain                                                    DATE

## 2012-02-04 ENCOUNTER — Ambulatory Visit (HOSPITAL_COMMUNITY)
Admission: RE | Admit: 2012-02-04 | Discharge: 2012-02-04 | Disposition: A | Discharge status: Home / Self Care | Payer: Medicare Other | Source: Ambulatory Visit | Attending: Family Medicine | Admitting: Family Medicine

## 2012-02-04 DIAGNOSIS — I69320 Aphasia following cerebral infarction: Secondary | ICD-10-CM

## 2012-02-04 NOTE — Progress Notes (Signed)
Speech Language Pathology Treatment Patient Details  Name: CAMERYN SCHUM MRN: [id number] Date of Birth: [birth year]  Today's Date: 02/04/2012 Time: 1135-1210 SLP Time Calculation (min): 35 min  Authorization: Medicare  Authorization Time Period: 02/01/2012-02/29/2012  Authorization Visit#:  7     HPI:  Symptoms/Limitations Symptoms: "Feeling depressed." (fibromyalgia) Pain Assessment Currently in Pain?: No/denies   Treatment  Apraxia Therapy Aphasia Therapy Patient/Family Education Home Exercise Program  SLP Goals  Home Exercise SLP Goal: Patient will Perform Home Exercise Program: Independently SLP Short Term Goals SLP Short Term Goal 1: Pt will repeat 3 syllable words x5 each with 80% acc and mod cues. SLP Short Term Goal 1 - Progress: Progressing toward goal SLP Short Term Goal 2: Pt will orally read 6-8 word sentences aloud with 90% acc and cue for error awareness. SLP Short Term Goal 2 - Progress: Progressing toward goal SLP Short Term Goal 3: Pt will complete convergent and divergent moderate level naming tasks with 90% acc and mi/mod cues. SLP Short Term Goal 3 - Progress: Progressing toward goal SLP Short Term Goal 4: Pt will formulate grammatically correct 5-7 word sentences when describing pictures with mod cues. SLP Short Term Goal 4 - Progress: Progressing toward goal SLP Short Term Goal 5: Pt will write 3-4 sentences to dictation with 90% acc and repetition prn. SLP Short Term Goal 5 - Progress: Progressing toward goal Additional SLP Short Term Goals?: Yes SLP Short Term Goal 6: Pt will attempt to self correct errors in conversational speech 90% of the time without cues. SLP Short Term Goal 6 - Progress: Progressing toward goal SLP Long Term Goals SLP Long Term Goal 1: Pt will express complex thoughts and needs to [facility] with use of compensatory strategies as needed.  SLP Long Term Goal 1 - Progress: Progressing toward goal SLP Long Term Goal 2: Pt will self  correct paraphasic errors 95% of the time in conversation. SLP Long Term Goal 2 - Progress: Progressing toward goal  Assessment/Plan  Patient Active Problem List  Diagnosis  . Delirium  . UTI (urinary tract infection)  . Anxiety  . Chronic abdominal pain  . Hypothyroid  . Fibromyalgia  . GERD (gastroesophageal reflux disease)  . HTN (hypertension), malignant  . Sinusitis  . Tinnitus  . Hypokalemia  . Abdominal lipoma  . Bronchitis  . Lumbar spondylosis  . Aphasia due to recent cerebral infarction   SLP - End of Session Activity Tolerance: Patient tolerated treatment well General Behavior During Session: [facility] for tasks performed Cognition: [facility] for tasks performed  SLP Assessment/Plan Clinical Impression Statement: Mrs. Manwarren was not feeling well today due to fibromyalgia and overall mood was flat. She completed a little bit of homework. During the session, she was able to write a word beginning with a specified letter with 95% accuracy, verbally create a sentence aloud with a given word with 72% acc, and orally read 4-5 word sentences with 95% acc. Writing short sentences to dictation was very challenging for her today. She benefitted from written cues for word order despite having her repeat the sentence before initiating the writing. Speech Therapy Frequency: min 2x/week Duration: 4 weeks Treatment/Interventions: Cueing hierarchy;Compensatory strategies;SLP instruction and feedback;Patient/family education Potential to Achieve Goals: Good Potential Considerations:  (Pt may have difficulty getting a ride here 2x/week)   Mileydi Milsap 02/04/2012, 12:23 PM

## 2012-02-05 ENCOUNTER — Ambulatory Visit (HOSPITAL_COMMUNITY): Payer: Medicare Other | Admitting: Speech Pathology

## 2012-02-06 ENCOUNTER — Other Ambulatory Visit (HOSPITAL_COMMUNITY): Payer: Medicare Other

## 2012-02-06 ENCOUNTER — Other Ambulatory Visit (HOSPITAL_COMMUNITY): Payer: Self-pay | Admitting: Neurology

## 2012-02-06 DIAGNOSIS — I639 Cerebral infarction, unspecified: Secondary | ICD-10-CM

## 2012-02-07 ENCOUNTER — Encounter (HOSPITAL_COMMUNITY): Payer: Self-pay | Admitting: Family Medicine

## 2012-02-13 DIAGNOSIS — I6789 Other cerebrovascular disease: Secondary | ICD-10-CM | POA: Diagnosis not present

## 2012-02-13 DIAGNOSIS — E785 Hyperlipidemia, unspecified: Secondary | ICD-10-CM | POA: Diagnosis not present

## 2012-02-13 DIAGNOSIS — Z23 Encounter for immunization: Secondary | ICD-10-CM | POA: Diagnosis not present

## 2012-02-13 DIAGNOSIS — I10 Essential (primary) hypertension: Secondary | ICD-10-CM | POA: Diagnosis not present

## 2012-02-18 ENCOUNTER — Ambulatory Visit (HOSPITAL_COMMUNITY): Payer: Medicare Other | Admitting: Speech Pathology

## 2012-02-19 ENCOUNTER — Ambulatory Visit (HOSPITAL_COMMUNITY)
Admission: RE | Admit: 2012-02-19 | Discharge: 2012-02-19 | Disposition: A | Discharge status: Home / Self Care | Payer: Medicare Other | Source: Ambulatory Visit | Attending: Family Medicine | Admitting: Family Medicine

## 2012-02-19 DIAGNOSIS — I69920 Aphasia following unspecified cerebrovascular disease: Secondary | ICD-10-CM | POA: Diagnosis not present

## 2012-02-19 DIAGNOSIS — IMO0001 Reserved for inherently not codable concepts without codable children: Secondary | ICD-10-CM | POA: Diagnosis not present

## 2012-02-19 DIAGNOSIS — I69320 Aphasia following cerebral infarction: Secondary | ICD-10-CM

## 2012-02-19 NOTE — Progress Notes (Signed)
Speech Language Pathology Treatment Patient Details  Name: [name] MRN: [id number] Date of Birth: Dec 07, 1940  Today's Date: 02/19/2012 Time: 1035-1120 SLP Time Calculation (min): 45 min  Authorization: Medicare  Authorization Time Period: 02/01/2012-02/29/2012  Authorization Visit#:  8 of     HPI:  Symptoms/Limitations Symptoms: "I've been depressed." (She says that she has spoken to Dr. Gerda Diss about this and that it helped.) Pain Assessment Currently in Pain?: No/denies Multiple Pain Sites: No   Treatment  Aphasia Therapy Apraxia Therapy Patient/Family Education Home Exercise Program  SLP Goals  Home Exercise SLP Goal: Patient will Perform Home Exercise Program: Independently SLP Goal: Perform Home Exercise Program - Progress: Progressing toward goal SLP Short Term Goals SLP Short Term Goal 1: Pt will repeat 3 syllable words x5 each with 80% acc and mod cues. SLP Short Term Goal 1 - Progress: Progressing toward goal SLP Short Term Goal 2: Pt will orally read 6-8 word sentences aloud with 90% acc and cue for error awareness. SLP Short Term Goal 2 - Progress: Progressing toward goal SLP Short Term Goal 3: Pt will complete convergent and divergent moderate level naming tasks with 90% acc and mi/mod cues. SLP Short Term Goal 3 - Progress: Progressing toward goal SLP Short Term Goal 4: Pt will formulate grammatically correct 5-7 word sentences when describing pictures with mod cues. SLP Short Term Goal 4 - Progress: Progressing toward goal SLP Short Term Goal 5: Pt will write 3-4 sentences to dictation with 90% acc and repetition prn. SLP Short Term Goal 5 - Progress: Progressing toward goal Additional SLP Short Term Goals?: Yes SLP Short Term Goal 6: Pt will attempt to self correct errors in conversational speech 90% of the time without cues. SLP Short Term Goal 6 - Progress: Progressing toward goal SLP Long Term Goals SLP Long Term Goal 1: Pt will express complex  thoughts and needs to [facility] with use of compensatory strategies as needed.  SLP Long Term Goal 1 - Progress: Progressing toward goal SLP Long Term Goal 2: Pt will self correct paraphasic errors 95% of the time in conversation. SLP Long Term Goal 2 - Progress: Progressing toward goal  Assessment/Plan  Patient Active Problem List  Diagnosis  . Delirium  . UTI (urinary tract infection)  . Anxiety  . Chronic abdominal pain  . Hypothyroid  . Fibromyalgia  . GERD (gastroesophageal reflux disease)  . HTN (hypertension), malignant  . Sinusitis  . Tinnitus  . Hypokalemia  . Abdominal lipoma  . Bronchitis  . Lumbar spondylosis  . Aphasia due to recent cerebral infarction   SLP - End of Session Activity Tolerance: Patient tolerated treatment well General Behavior During Session: [facility] for tasks performed Cognition: [facility] for tasks performed  SLP Assessment/Plan Clinical Impression Statement: Mrs. [name] says that she saw Dr. Gerda Diss and talked about feeling depressed. She completed much of her homework and I provided additional until I see her on Friday. She had a difficult time creating a sentence with a  given word, but with assistance (provided the first couple of words) she was able to do so. She did a great job coming up with single words when given the first two letters (95% acc). Speech Therapy Frequency: min 2x/week Duration: 4 weeks Treatment/Interventions: Cueing hierarchy;Compensatory strategies;SLP instruction and feedback;Patient/family education Potential to Achieve Goals: Good Potential Considerations:  (Pt may have difficulty getting a ride here 2x/week)    PORTER,DABNEY 02/19/2012, 12:33 PM

## 2012-02-22 ENCOUNTER — Ambulatory Visit (HOSPITAL_COMMUNITY)
Admission: RE | Admit: 2012-02-22 | Discharge: 2012-02-22 | Disposition: A | Discharge status: Home / Self Care | Payer: Medicare Other | Source: Ambulatory Visit | Attending: Family Medicine | Admitting: Family Medicine

## 2012-02-22 DIAGNOSIS — I69320 Aphasia following cerebral infarction: Secondary | ICD-10-CM

## 2012-02-22 NOTE — Progress Notes (Signed)
Speech Language Pathology Treatment Patient Details  Name: [name] MRN: [id number] Date of Birth: 04-May-1941  Today's Date: 02/22/2012 Time: 0905-0951 SLP Time Calculation (min): 46 min  Authorization: Medicare  Authorization Time Period: 02/01/2012-02/29/2012  Authorization Visit#:  9 of     HPI:  Symptoms/Limitations Symptoms: "I didn't do my homework because my friend had my car." (her folder was in her car) Pain Assessment Currently in Pain?: No/denies Multiple Pain Sites: No   Treatment  Aphasia Therapy Apraxia Therapy Patient/Family Education Home Exercise Program  SLP Goals  Home Exercise SLP Goal: Patient will Perform Home Exercise Program: Independently SLP Goal: Perform Home Exercise Program - Progress: Progressing toward goal SLP Short Term Goals SLP Short Term Goal 1: Pt will repeat 3 syllable words x5 each with 80% acc and mod cues. SLP Short Term Goal 1 - Progress: Progressing toward goal SLP Short Term Goal 2: Pt will orally read 6-8 word sentences aloud with 90% acc and cue for error awareness. SLP Short Term Goal 2 - Progress: Progressing toward goal SLP Short Term Goal 3: Pt will complete convergent and divergent moderate level naming tasks with 90% acc and mi/mod cues. SLP Short Term Goal 3 - Progress: Progressing toward goal SLP Short Term Goal 4: Pt will formulate grammatically correct 5-7 word sentences when describing pictures with mod cues. SLP Short Term Goal 4 - Progress: Progressing toward goal SLP Short Term Goal 5: Pt will write 3-4 sentences to dictation with 90% acc and repetition prn. SLP Short Term Goal 5 - Progress: Progressing toward goal Additional SLP Short Term Goals?: Yes SLP Short Term Goal 6: Pt will attempt to self correct errors in conversational speech 90% of the time without cues. SLP Short Term Goal 6 - Progress: Progressing toward goal SLP Long Term Goals SLP Long Term Goal 1: Pt will express complex thoughts and  needs to [facility] with use of compensatory strategies as needed.  SLP Long Term Goal 1 - Progress: Progressing toward goal SLP Long Term Goal 2: Pt will self correct paraphasic errors 95% of the time in conversation. SLP Long Term Goal 2 - Progress: Progressing toward goal  Assessment/Plan  Patient Active Problem List  Diagnosis  . Delirium  . UTI (urinary tract infection)  . Anxiety  . Chronic abdominal pain  . Hypothyroid  . Fibromyalgia  . GERD (gastroesophageal reflux disease)  . HTN (hypertension), malignant  . Sinusitis  . Tinnitus  . Hypokalemia  . Abdominal lipoma  . Bronchitis  . Lumbar spondylosis  . Aphasia due to recent cerebral infarction   SLP - End of Session Activity Tolerance: Patient tolerated treatment well General Behavior During Session: [facility] for tasks performed Cognition: [facility] for tasks performed  SLP Assessment/Plan Clinical Impression Statement: Mrs. Greaves is doing well- seems to be in better spirits today. Today, we focused on writing to dictation. Initially, she was able to write 4 word sentences with ~75% acc, however once we got past 3 sentences she had difficulty sequencing the words. She often came up with an entirely different sentence with reduced awaremess until she was asked to read aloud. It appeared there was some perseveration from previous sentences which interfered with new sentences. We went back to single words and this was very easy for her. Sequencing became more difficult with two words (ie. instead of writing Happy Birthday she wrote [place] Ouch Happy).  She completed naming tasks with 90% acc with min assist. Additional homework assigned. Speech Therapy Frequency: min 2x/week Duration:  4 weeks Treatment/Interventions: Cueing hierarchy;Compensatory strategies;SLP instruction and feedback;Patient/family education Potential to Achieve Goals: Good Potential Considerations:  (Pt may have difficulty getting a ride here 2x/week)  GN: Next  session.    PORTER,DABNEY 02/22/2012, 10:03 AM

## 2012-02-28 ENCOUNTER — Ambulatory Visit (HOSPITAL_COMMUNITY)
Admission: RE | Admit: 2012-02-28 | Discharge: 2012-02-28 | Disposition: A | Discharge status: Home / Self Care | Payer: Medicare Other | Source: Ambulatory Visit | Attending: Family Medicine | Admitting: Family Medicine

## 2012-02-28 DIAGNOSIS — I69320 Aphasia following cerebral infarction: Secondary | ICD-10-CM

## 2012-02-28 NOTE — Progress Notes (Signed)
Speech Language Pathology Treatment Patient Details  Name: [name] MRN: [id number] Date of Birth: 1940/12/06  Today's Date: 02/28/2012 Time: 1045-1130 SLP Time Calculation (min): 45 min  Authorization: Medicare  Authorization Time Period: 02/01/2012-02/29/2012  Authorization Visit#:  10 of  10   HPI:  Symptoms/Limitations Symptoms: "I have my homeowork." Pain Assessment Currently in Pain?: No/denies Multiple Pain Sites: No   Treatment  Aphasia Therapy Apraxia Therapy Patient/Family Education Home Exercise Program  SLP Goals  Home Exercise SLP Goal: Patient will Perform Home Exercise Program: Independently SLP Goal: Perform Home Exercise Program - Progress: Progressing toward goal SLP Short Term Goals SLP Short Term Goal 1: Pt will repeat 3 syllable words x5 each with 80% acc and mod cues. SLP Short Term Goal 1 - Progress: Met SLP Short Term Goal 2: Pt will orally read 6-8 word sentences aloud with 90% acc and cue for error awareness. SLP Short Term Goal 2 - Progress: Progressing toward goal SLP Short Term Goal 3: Pt will complete convergent and divergent moderate level naming tasks with 90% acc and mi/mod cues. SLP Short Term Goal 3 - Progress: Met SLP Short Term Goal 4: Pt will formulate grammatically correct 5-7 word sentences when describing pictures with mod cues. SLP Short Term Goal 4 - Progress: Progressing toward goal SLP Short Term Goal 5: Pt will write 3-4 sentences to dictation with 90% acc and repetition prn. SLP Short Term Goal 5 - Progress: Progressing toward goal Additional SLP Short Term Goals?: Yes SLP Short Term Goal 6: Pt will attempt to self correct errors in conversational speech 90% of the time without cues. SLP Short Term Goal 6 - Progress: Progressing toward goal SLP Long Term Goals SLP Long Term Goal 1: Pt will express complex thoughts and needs to [name] with use of compensatory strategies as needed.  SLP Long Term Goal 1 - Progress:  Progressing toward goal SLP Long Term Goal 2: Pt will self correct paraphasic errors 95% of the time in conversation. SLP Long Term Goal 2 - Progress: Progressing toward goal  Assessment/Plan  Patient Active Problem List  Diagnosis  . Delirium  . UTI (urinary tract infection)  . Anxiety  . Chronic abdominal pain  . Hypothyroid  . Fibromyalgia  . GERD (gastroesophageal reflux disease)  . HTN (hypertension), malignant  . Sinusitis  . Tinnitus  . Hypokalemia  . Abdominal lipoma  . Bronchitis  . Lumbar spondylosis  . Aphasia due to recent cerebral infarction   SLP - End of Session Activity Tolerance: Patient tolerated treatment well General Behavior During Session: [name] for tasks performed Cognition: [facility] for tasks performed  SLP Assessment/Plan Clinical Impression Statement: Mrs. [name] had a great session today and she completed all assigned homework. She completed the written sentence completion tasks with fair ease and read them aloud with 90% acc. She was able to read 3-syllable words aloud with 95% acc and repeat 3-syllable words when presented orally with 95% acc with reps of 5 each. This is a tremendous improvement. She continues to have difficulty forming grammatically correct (mostly word order) sentences even when given 3 scrammbled words. This task was easier when the first word was highlighted for her and when cued to verbally mediate. If she just tries to write it without verbalizing, she writes the words out of order. She was given crossword puzzles to try for homework. Speech Therapy Frequency: min 2x/week Duration: 4 weeks Treatment/Interventions: Cueing hierarchy;Compensatory strategies;SLP instruction and feedback;Patient/family education Potential to Achieve Goals: Good Potential  Considerations:  (Pt may have difficulty getting a ride here 2x/week)  GN Functional Assessment Tool Used: clinical judgement, reassessment of goals Functional Limitations: Spoken  language expressive Spoken Language Expression Current Status [id number]): At least 20 percent but less than 40 percent impaired, limited or restricted Spoken Language Expression Goal Status [phone]): At least 1 percent but less than 20 percent impaired, limited or restricted  Thank you,  Havery Moros, CCC-SLP [phone]  PORTER,DABNEY 02/28/2012, 11:48 AM                                                     Physician Treatment Plan  Your signature is required to indicate approval of the treatment plan/progress as stated above. Please make a copy of this report for your files and return this physician signed original in the self-addressed envelope or fax to [phone]. COMMENTS/CHANGES:__________________________________________________________________________________________________________________________   ____________________________________                      ____________________ Benjamin Stain                                                    DATE

## 2012-03-03 ENCOUNTER — Ambulatory Visit (HOSPITAL_COMMUNITY)
Admission: RE | Admit: 2012-03-03 | Discharge: 2012-03-03 | Disposition: A | Discharge status: Home / Self Care | Payer: Medicare Other | Source: Ambulatory Visit | Attending: Family Medicine | Admitting: Family Medicine

## 2012-03-03 DIAGNOSIS — I69320 Aphasia following cerebral infarction: Secondary | ICD-10-CM

## 2012-03-03 NOTE — Progress Notes (Signed)
Speech Language Pathology Treatment Patient Details  Name: [name] MRN: [id number] Date of Birth: 1940-05-19  Today's Date: 03/03/2012 Time: 1115-1200 SLP Time Calculation (min): 45 min  Authorization: Medicare  Authorization Time Period: 03/01/2012-03/30/2012  Authorization Visit#:  11 of     HPI:  Symptoms/Limitations Symptoms: "I didn't do anything." Pain Assessment Currently in Pain?: No/denies Multiple Pain Sites: No   Treatment  Aphasia Therapy Apraxia Therapy Patient/Family Education Home Exercise Program  SLP Goals  Home Exercise SLP Goal: Patient will Perform Home Exercise Program: Independently SLP Goal: Perform Home Exercise Program - Progress: Progressing toward goal SLP Short Term Goals SLP Short Term Goal 1: Pt will repeat 5-6 word sentences after modeled by clinician with 95% acc and repetition as needed x 1.  SLP Short Term Goal 1 - Progress: Progressing toward goal SLP Short Term Goal 2: Pt will orally read 6-8 word sentences aloud with 90% acc and cue for error awareness. SLP Short Term Goal 2 - Progress: Progressing toward goal SLP Short Term Goal 3: Pt will complete convergent and divergent moderate level naming tasks with 95% acc and min cues. SLP Short Term Goal 3 - Progress: Progressing toward goal SLP Short Term Goal 4: Pt will formulate grammatically correct 5-7 word sentences when describing pictures with 80% acc and mi/mod cues.  SLP Short Term Goal 4 - Progress: Progressing toward goal SLP Short Term Goal 5: Pt will write 3-4 sentences to dictation with 90% acc and repetition prn. SLP Short Term Goal 5 - Progress: Progressing toward goal (70% today when given 2-3 words at a time.) Additional SLP Short Term Goals?: Yes SLP Short Term Goal 6: Pt will attempt to self correct errors in conversational speech 90% of the time without cues. SLP Short Term Goal 6 - Progress: Progressing toward goal SLP Long Term Goals SLP Long Term Goal 1: Pt  will express complex thoughts and needs to [facility] with use of compensatory strategies as needed.  SLP Long Term Goal 1 - Progress: Progressing toward goal SLP Long Term Goal 2: Pt will self correct paraphasic errors 95% of the time in conversation. SLP Long Term Goal 2 - Progress: Progressing toward goal  Assessment/Plan  Patient Active Problem List  Diagnosis  . Delirium  . UTI (urinary tract infection)  . Anxiety  . Chronic abdominal pain  . Hypothyroid  . Fibromyalgia  . GERD (gastroesophageal reflux disease)  . HTN (hypertension), malignant  . Sinusitis  . Tinnitus  . Hypokalemia  . Abdominal lipoma  . Bronchitis  . Lumbar spondylosis  . Aphasia due to recent cerebral infarction   SLP - End of Session Activity Tolerance: Patient tolerated treatment well General Behavior During Session: [name] for tasks performed Cognition: [facility] for tasks performed  SLP Assessment/Plan Clinical Impression Statement: [name] did not complete her homework because she was surprised that they were crossword puzzles and not word search puzzles. We completed them together initially in session and she required only minimal cueing. She was asked to complete another one for homework. She generated sentences when looking at "what's wrong" pictures with 76% accuracy and mod cueing. During sentence repetition tasks, she was able to repeat 5-6 word sentences (while looking at a picture) with 92% acc. Writing to dication is still challenging, however she completed a short letter (5 sentences) with 70% acc when given 2-3 words at a time.  Speech Therapy Frequency: min 2x/week Duration: 4 weeks Treatment/Interventions: Cueing hierarchy;Compensatory strategies;SLP instruction and feedback;Patient/family education Potential to  Achieve Goals: Good Potential Considerations:  (Pt may have difficulty getting a ride here 2x/week)  Thank you,  [name], CCC-SLP [phone]  [name] 03/03/2012, 12:17  PM

## 2012-03-04 ENCOUNTER — Ambulatory Visit (HOSPITAL_COMMUNITY): Payer: Medicare Other | Admitting: Speech Pathology

## 2012-03-04 ENCOUNTER — Ambulatory Visit (HOSPITAL_COMMUNITY)
Admission: RE | Admit: 2012-03-04 | Discharge: 2012-03-04 | Disposition: A | Discharge status: Home / Self Care | Payer: Medicare Other | Source: Ambulatory Visit | Attending: Family Medicine | Admitting: Family Medicine

## 2012-03-04 ENCOUNTER — Inpatient Hospital Stay (HOSPITAL_COMMUNITY): Admission: RE | Admit: 2012-03-04 | Payer: Medicare Other | Source: Ambulatory Visit | Admitting: Speech Pathology

## 2012-03-04 DIAGNOSIS — I69320 Aphasia following cerebral infarction: Secondary | ICD-10-CM

## 2012-03-04 NOTE — Progress Notes (Signed)
Speech Language Pathology Treatment Patient Details  Name: [name] MRN: [id number] Date of Birth: March 22, 1941  Today's Date: 03/04/2012 Time: 1300-1345 SLP Time Calculation (min): 45 min  Authorization: Medicare  Authorization Time Period: 03/01/2012-03/30/2012  Authorization Visit#:  12 of     HPI:  Symptoms/Limitations Symptoms: Doing well. Pain Assessment Currently in Pain?: No/denies Multiple Pain Sites: No   Treatment  Aphasia Therapy Apraxia Therapy Patient/Family Education Home Exercise Program  SLP Goals  Home Exercise SLP Goal: Patient will Perform Home Exercise Program: Independently SLP Goal: Perform Home Exercise Program - Progress: Progressing toward goal SLP Short Term Goals SLP Short Term Goal 1: Pt will repeat 5-6 word sentences after modeled by clinician with 95% acc and repetition as needed x 1.  SLP Short Term Goal 1 - Progress: Progressing toward goal SLP Short Term Goal 2: Pt will orally read 6-8 word sentences aloud with 90% acc and cue for error awareness. SLP Short Term Goal 2 - Progress: Progressing toward goal SLP Short Term Goal 3: Pt will complete convergent and divergent moderate level naming tasks with 95% acc and min cues. SLP Short Term Goal 3 - Progress: Progressing toward goal SLP Short Term Goal 4: Pt will formulate grammatically correct 5-7 word sentences when describing pictures with 80% acc and mi/mod cues.  SLP Short Term Goal 4 - Progress: Progressing toward goal SLP Short Term Goal 5: Pt will write 3-4 sentences to dictation with 90% acc and repetition prn. SLP Short Term Goal 5 - Progress: Progressing toward goal Additional SLP Short Term Goals?: Yes SLP Short Term Goal 6: Pt will attempt to self correct errors in conversational speech 90% of the time without cues. SLP Short Term Goal 6 - Progress: Progressing toward goal SLP Long Term Goals SLP Long Term Goal 1: Pt will express complex thoughts and needs to [facility] with use  of compensatory strategies as needed.  SLP Long Term Goal 1 - Progress: Progressing toward goal SLP Long Term Goal 2: Pt will self correct paraphasic errors 95% of the time in conversation. SLP Long Term Goal 2 - Progress: Progressing toward goal  Assessment/Plan  Patient Active Problem List  Diagnosis  . Delirium  . UTI (urinary tract infection)  . Anxiety  . Chronic abdominal pain  . Hypothyroid  . Fibromyalgia  . GERD (gastroesophageal reflux disease)  . HTN (hypertension), malignant  . Sinusitis  . Tinnitus  . Hypokalemia  . Abdominal lipoma  . Bronchitis  . Lumbar spondylosis  . Aphasia due to recent cerebral infarction   SLP - End of Session Activity Tolerance: Patient tolerated treatment well General Behavior During Session: [facility] for tasks performed Cognition: [facility] for tasks performed  SLP Assessment/Plan Clinical Impression Statement: [name] completed a basic level crossword puzzle for homework as well as re-writing scrambled sentences. She was able to state the function of an object with 100% acc and min cues. She had a very difficult time providing a description of an object (without stating the name of the object) during barrier tasks and needed max cues. She was able to name from therapist's description of an object with 100% acc. Pt was able to sequence 3-step story cards independently and provide a short description with mild/mod cues.  Speech Therapy Frequency: min 2x/week Duration: 4 weeks Treatment/Interventions: Cueing hierarchy;Compensatory strategies;SLP instruction and feedback;Patient/family education Potential to Achieve Goals: Good Potential Considerations:  (Pt may have difficulty getting a ride here 2x/week)   [name] 03/04/2012, 2:55 PM

## 2012-03-13 ENCOUNTER — Ambulatory Visit (HOSPITAL_COMMUNITY)
Admission: RE | Admit: 2012-03-13 | Discharge: 2012-03-13 | Disposition: A | Discharge status: Home / Self Care | Payer: Medicare Other | Source: Ambulatory Visit | Attending: Family Medicine | Admitting: Family Medicine

## 2012-03-13 DIAGNOSIS — I69320 Aphasia following cerebral infarction: Secondary | ICD-10-CM

## 2012-03-13 NOTE — Progress Notes (Signed)
Speech Language Pathology Treatment Patient Details  Name: [name] MRN: [id number] Date of Birth: 1941-05-03  Today's Date: 03/13/2012 Time: 1015-1100 SLP Time Calculation (min): 45 min  Authorization: Medicare  Authorization Time Period: 03/01/2012-03/30/2012  Authorization Visit#:  13 of     HPI:  Symptoms/Limitations Symptoms: Pt. smiling and talkative today. Pain Assessment Currently in Pain?: No/denies  Treatment  Aphasia Therapy Patient/Family Education Home Exercise Program  SLP Goals  Home Exercise SLP Goal: Patient will Perform Home Exercise Program: Independently SLP Goal: Perform Home Exercise Program - Progress: Progressing toward goal SLP Short Term Goals SLP Short Term Goal 1: Pt will repeat 5-6 word sentences after modeled by clinician with 95% acc and repetition as needed x 1.  SLP Short Term Goal 1 - Progress: Progressing toward goal SLP Short Term Goal 2: Pt will orally read 6-8 word sentences aloud with 90% acc and cue for error awareness. SLP Short Term Goal 2 - Progress: Progressing toward goal SLP Short Term Goal 3: Pt will complete convergent and divergent moderate level naming tasks with 95% acc and min cues. SLP Short Term Goal 3 - Progress: Progressing toward goal SLP Short Term Goal 4: Pt will formulate grammatically correct 5-7 word sentences when describing pictures with 80% acc and mi/mod cues.  SLP Short Term Goal 4 - Progress: Progressing toward goal SLP Short Term Goal 5: Pt will write 3-4 sentences to dictation with 90% acc and repetition prn. SLP Short Term Goal 5 - Progress: Progressing toward goal Additional SLP Short Term Goals?: Yes SLP Short Term Goal 6: Pt will attempt to self correct errors in conversational speech 90% of the time without cues. SLP Short Term Goal 6 - Progress: Progressing toward goal SLP Long Term Goals SLP Long Term Goal 1: Pt will express complex thoughts and needs to [facility] with use of compensatory  strategies as needed.  SLP Long Term Goal 1 - Progress: Progressing toward goal SLP Long Term Goal 2: Pt will self correct paraphasic errors 95% of the time in conversation. SLP Long Term Goal 2 - Progress: Progressing toward goal  Assessment/Plan  Patient Active Problem List  Diagnosis  . Delirium  . UTI (urinary tract infection)  . Anxiety  . Chronic abdominal pain  . Hypothyroid  . Fibromyalgia  . GERD (gastroesophageal reflux disease)  . HTN (hypertension), malignant  . Sinusitis  . Tinnitus  . Hypokalemia  . Abdominal lipoma  . Bronchitis  . Lumbar spondylosis  . Aphasia due to recent cerebral infarction   SLP - End of Session Activity Tolerance: Patient tolerated treatment well General Behavior During Session: [facility] for tasks performed Cognition: [facility] for tasks performed  SLP Assessment/Plan Clinical Impression Statement: Mrs. Sprowl completed her homework. She read a 6-sentence paragraph with ~85% intelligibility due to paraphasic errors. She attempted to self correct most of her substitutions. She was able to use given words in a sentence with 75% acc and min cues. Mrs. Selden seemed more comfortable expressing herself today and therapist needed clarification. Speech Therapy Frequency: min 2x/week Duration: 4 weeks Treatment/Interventions: Cueing hierarchy;Compensatory strategies;SLP instruction and feedback;Patient/family education Potential to Achieve Goals: Good Potential Considerations:  (Pt may have difficulty getting a ride here 2x/week)   Thank you,  Havery Moros, CCC-SLP [phone]  Davionne Mastrangelo 03/13/2012, 4:11 PM

## 2012-03-14 MED ORDER — EPINEPHRINE HCL 1 MG/ML IJ SOLN
INTRAMUSCULAR | Status: AC
  Filled 2012-03-14: qty 3

## 2012-03-17 ENCOUNTER — Ambulatory Visit (HOSPITAL_COMMUNITY)
Admission: RE | Admit: 2012-03-17 | Discharge: 2012-03-17 | Disposition: A | Discharge status: Home / Self Care | Payer: Medicare Other | Source: Ambulatory Visit | Attending: Family Medicine | Admitting: Family Medicine

## 2012-03-17 DIAGNOSIS — I69920 Aphasia following unspecified cerebrovascular disease: Secondary | ICD-10-CM | POA: Diagnosis not present

## 2012-03-17 DIAGNOSIS — IMO0001 Reserved for inherently not codable concepts without codable children: Secondary | ICD-10-CM | POA: Diagnosis not present

## 2012-03-17 DIAGNOSIS — I69320 Aphasia following cerebral infarction: Secondary | ICD-10-CM

## 2012-03-17 NOTE — Progress Notes (Signed)
Speech Language Pathology Treatment Patient Details  Name: KEMANI HEIDEL MRN: [id number] Date of Birth: 07/28/1940  Today's Date: 03/17/2012 Time: 1025-1115 SLP Time Calculation (min): 50 min  Authorization: Medicare  Authorization Time Period: 03/01/2012-03/30/2012  Authorization Visit#:  14 of     HPI:  Symptoms/Limitations Symptoms: Fibromyalgia is bothering her some today. Pain Assessment Currently in Pain?: No/denies   Treatment  Aphasia Therapy Apraxia Therapy Patient/Family Education Home Exercise Program  SLP Goals  Home Exercise SLP Goal: Patient will Perform Home Exercise Program: Independently SLP Goal: Perform Home Exercise Program - Progress: Progressing toward goal SLP Short Term Goals SLP Short Term Goal 1: Pt will repeat 5-6 word sentences after modeled by clinician with 95% acc and repetition as needed x 1.  SLP Short Term Goal 1 - Progress: Progressing toward goal SLP Short Term Goal 2: Pt will orally read 6-8 word sentences aloud with 90% acc and cue for error awareness. SLP Short Term Goal 2 - Progress: Progressing toward goal SLP Short Term Goal 3: Pt will complete convergent and divergent moderate level naming tasks with 95% acc and min cues. SLP Short Term Goal 3 - Progress: Progressing toward goal SLP Short Term Goal 4: Pt will formulate grammatically correct 5-7 word sentences when describing pictures with 80% acc and mi/mod cues.  SLP Short Term Goal 4 - Progress: Progressing toward goal SLP Short Term Goal 5: Pt will write 3-4 sentences to dictation with 90% acc and repetition prn. SLP Short Term Goal 5 - Progress: Progressing toward goal Additional SLP Short Term Goals?: Yes SLP Short Term Goal 6: Pt will attempt to self correct errors in conversational speech 90% of the time without cues. SLP Short Term Goal 6 - Progress: Progressing toward goal SLP Long Term Goals SLP Long Term Goal 1: Pt will express complex thoughts and needs to [facility] with  use of compensatory strategies as needed.  SLP Long Term Goal 1 - Progress: Progressing toward goal SLP Long Term Goal 2: Pt will self correct paraphasic errors 95% of the time in conversation. SLP Long Term Goal 2 - Progress: Progressing toward goal  Assessment/Plan  Patient Active Problem List  Diagnosis  . Delirium  . UTI (urinary tract infection)  . Anxiety  . Chronic abdominal pain  . Hypothyroid  . Fibromyalgia  . GERD (gastroesophageal reflux disease)  . HTN (hypertension), malignant  . Sinusitis  . Tinnitus  . Hypokalemia  . Abdominal lipoma  . Bronchitis  . Lumbar spondylosis  . Aphasia due to recent cerebral infarction   SLP - End of Session Activity Tolerance: Patient tolerated treatment well General Behavior During Session: [facility] for tasks performed Cognition: [facility] for tasks performed  SLP Assessment/Plan Clinical Impression Statement: Mrs. Courser did a great job attempting to self correct paraphasic errors in conversational speech today. She had some difficulty with the crossword puzzles. We completed some together, however they would be too difficult for her to complete on her own. When writing to dictation, she needs visual cues and task modification/scaffolding to complete 3-4 word sentences.  Speech Therapy Frequency: min 2x/week Duration: 4 weeks Treatment/Interventions: Cueing hierarchy;Compensatory strategies;SLP instruction and feedback;Patient/family education Potential to Achieve Goals: Good Potential Considerations:  (Pt may have difficulty getting a ride here 2x/week)    Nazair Fortenberry 03/17/2012, 11:33 AM

## 2012-03-18 ENCOUNTER — Ambulatory Visit (HOSPITAL_COMMUNITY): Payer: Medicare Other | Admitting: Speech Pathology

## 2012-03-27 ENCOUNTER — Ambulatory Visit (HOSPITAL_COMMUNITY): Payer: Medicare Other | Admitting: Speech Pathology

## 2012-04-14 DIAGNOSIS — J019 Acute sinusitis, unspecified: Secondary | ICD-10-CM | POA: Diagnosis not present

## 2012-04-23 ENCOUNTER — Emergency Department (HOSPITAL_COMMUNITY)
Admission: EM | Admit: 2012-04-23 | Discharge: 2012-04-23 | Disposition: A | Discharge status: Home / Self Care | Payer: Medicare Other | Attending: Emergency Medicine | Admitting: Emergency Medicine

## 2012-04-23 ENCOUNTER — Encounter (HOSPITAL_COMMUNITY): Payer: Self-pay | Admitting: Emergency Medicine

## 2012-04-23 ENCOUNTER — Emergency Department (HOSPITAL_COMMUNITY): Payer: Medicare Other

## 2012-04-23 DIAGNOSIS — E039 Hypothyroidism, unspecified: Secondary | ICD-10-CM | POA: Diagnosis not present

## 2012-04-23 DIAGNOSIS — R109 Unspecified abdominal pain: Secondary | ICD-10-CM | POA: Insufficient documentation

## 2012-04-23 DIAGNOSIS — R197 Diarrhea, unspecified: Secondary | ICD-10-CM | POA: Diagnosis not present

## 2012-04-23 DIAGNOSIS — R195 Other fecal abnormalities: Secondary | ICD-10-CM

## 2012-04-23 DIAGNOSIS — R1110 Vomiting, unspecified: Secondary | ICD-10-CM | POA: Insufficient documentation

## 2012-04-23 DIAGNOSIS — M542 Cervicalgia: Secondary | ICD-10-CM | POA: Diagnosis not present

## 2012-04-23 DIAGNOSIS — IMO0002 Reserved for concepts with insufficient information to code with codable children: Secondary | ICD-10-CM | POA: Diagnosis not present

## 2012-04-23 DIAGNOSIS — I69920 Aphasia following unspecified cerebrovascular disease: Secondary | ICD-10-CM | POA: Insufficient documentation

## 2012-04-23 DIAGNOSIS — I69998 Other sequelae following unspecified cerebrovascular disease: Secondary | ICD-10-CM | POA: Diagnosis not present

## 2012-04-23 DIAGNOSIS — F411 Generalized anxiety disorder: Secondary | ICD-10-CM | POA: Diagnosis not present

## 2012-04-23 DIAGNOSIS — M549 Dorsalgia, unspecified: Secondary | ICD-10-CM | POA: Diagnosis not present

## 2012-04-23 DIAGNOSIS — I10 Essential (primary) hypertension: Secondary | ICD-10-CM | POA: Insufficient documentation

## 2012-04-23 DIAGNOSIS — F17200 Nicotine dependence, unspecified, uncomplicated: Secondary | ICD-10-CM | POA: Insufficient documentation

## 2012-04-23 DIAGNOSIS — K921 Melena: Secondary | ICD-10-CM | POA: Diagnosis not present

## 2012-04-23 DIAGNOSIS — Z79899 Other long term (current) drug therapy: Secondary | ICD-10-CM | POA: Insufficient documentation

## 2012-04-23 LAB — CBC WITH DIFFERENTIAL/PLATELET
Eosinophils Absolute: 0.4 10*3/uL (ref 0.0–0.7)
Eosinophils Relative: 2 % (ref 0–5)
HCT: 40.4 % (ref 36.0–46.0)
Lymphocytes Relative: 12 % (ref 12–46)
Lymphs Abs: 2.6 10*3/uL (ref 0.7–4.0)
MCH: 30 pg (ref 26.0–34.0)
MCV: 89 fL (ref 78.0–100.0)
Monocytes Absolute: 1.3 10*3/uL — ABNORMAL HIGH (ref 0.1–1.0)
Monocytes Relative: 6 % (ref 3–12)
Platelets: 305 10*3/uL (ref 150–400)
RBC: 4.54 MIL/uL (ref 3.87–5.11)
WBC: 23 10*3/uL — ABNORMAL HIGH (ref 4.0–10.5)

## 2012-04-23 LAB — URINALYSIS, ROUTINE W REFLEX MICROSCOPIC
Bilirubin Urine: NEGATIVE
Glucose, UA: NEGATIVE mg/dL
Specific Gravity, Urine: 1.02 (ref 1.005–1.030)
Urobilinogen, UA: 0.2 mg/dL (ref 0.0–1.0)

## 2012-04-23 LAB — BASIC METABOLIC PANEL
BUN: 19 mg/dL (ref 6–23)
CO2: 25 mEq/L (ref 19–32)
Calcium: 10.3 mg/dL (ref 8.4–10.5)
Creatinine, Ser: 1.13 mg/dL — ABNORMAL HIGH (ref 0.50–1.10)
GFR calc non Af Amer: 48 mL/min — ABNORMAL LOW (ref >90)
Glucose, Bld: 125 mg/dL — ABNORMAL HIGH (ref 70–99)
Sodium: 136 mEq/L (ref 135–145)

## 2012-04-23 LAB — LACTIC ACID, PLASMA: Lactic Acid, Venous: 1.4 mmol/L (ref 0.5–2.2)

## 2012-04-23 MED ORDER — OXYCODONE-ACETAMINOPHEN 5-325 MG PO TABS
1.0000 | ORAL_TABLET | Freq: Once | ORAL | Status: AC
  Administered 2012-04-23: 1 via ORAL
  Filled 2012-04-23: qty 1

## 2012-04-23 NOTE — ED Notes (Signed)
Pt held oral fluids well.

## 2012-04-23 NOTE — ED Notes (Signed)
Pt bp dropped when stading .Marland Kitchen Pt stated she felt a little swimmy headed

## 2012-04-23 NOTE — ED Provider Notes (Signed)
History  This chart was scribed for Joya Gaskins, MD by Erskine Emery, ED Scribe. This patient was seen in room APA18/[facility]18 and the patient's care was started at 15:22.   CSN: [id number]  Arrival date & time 04/23/12  1318   First MD Initiated Contact with Patient 04/23/12 1522      Chief Complaint  Patient presents with  . Emesis  . Diarrhea   The history is provided by the patient and the spouse. No language interpreter was used.  [name] is a 71 y.o. female who presents to the Emergency Department complaining of neck pain, right leg pain, mild abdominal pain, and a fall. Pt also reports she had some mild emesis and diarrhea for 3 days, until today when she had only 1 episode of emesis. Pt had a stroke and now has mild expressive aphasia and uses a walker. When she fell yesterday, she did not hit her head or have any LOC. Pt denies any associated coughing, chest pain, headache, or SOB. Pt reports she has not been eating much.  Past Medical History  Diagnosis Date  . Anxiety   . Hypothyroidism   . Hypertension   . Lumbar spondylosis 01/23/2011    History reviewed. No pertinent past surgical history.  No family history on file.  History  Substance Use Topics  . Smoking status: Current Every Day Smoker -- 2.0 packs/day for 30 years    Types: Cigarettes  . Smokeless tobacco: Not on file  . Alcohol Use: No    OB History    Grav Para Term Preterm Abortions TAB SAB Ect Mult Living                  Review of Systems  Constitutional: Positive for appetite change.  HENT: Positive for neck pain.   Respiratory: Negative for cough and shortness of breath.   Cardiovascular: Negative for chest pain.  Gastrointestinal: Positive for vomiting, abdominal pain and diarrhea.  Neurological: Negative for syncope and headaches.  All other systems reviewed and are negative.    Allergies  Review of patient's allergies indicates no known allergies.  Home Medications    Current Outpatient Rx  Name  Route  Sig  Dispense  Refill  . ALBUTEROL SULFATE HFA 108 (90 BASE) MCG/ACT IN AERS   Inhalation   Inhale 2 puffs into the lungs every 4 (four) hours.         . ALPRAZOLAM 1 MG PO TABS   Oral   Take 0.5 mg by mouth 3 (three) times daily as needed. Patient takes 0.5mg  three times a day during the day then 1&1/2 tablet at bedtime          . CITALOPRAM HYDROBROMIDE 20 MG PO TABS   Oral   Take 20 mg by mouth daily.           Marland Kitchen FLUTICASONE-SALMETEROL 250-50 MCG/DOSE IN AEPB   Inhalation   Inhale 1 puff into the lungs 2 (two) times daily.   60 each      . HYDROCODONE-ACETAMINOPHEN 10-325 MG PO TABS   Oral   Take 1 tablet by mouth every 4 (four) hours as needed. Pain(max 4tabs per day)            . LEVOTHYROXINE SODIUM 112 MCG PO TABS   Oral   Take 112 mcg by mouth daily.           Marland Kitchen LISINOPRIL 10 MG PO TABS      TAKE 1  AND 1/2 TABLETS DAILY FOR YOUR BLOOD PRESSURE.   45 tablet   1   . MECLIZINE HCL 25 MG PO TABS   Oral   Take 25 mg by mouth every 4 (four) hours as needed. dizziness          . NORTRIPTYLINE HCL 50 MG PO CAPS   Oral   Take 50 mg by mouth at bedtime.           Marland Kitchen POLYETHYLENE GLYCOL 3350 PO POWD   Oral   Take 17 g by mouth daily.           Marland Kitchen RANITIDINE HCL 300 MG PO CAPS   Oral   Take 300 mg by mouth daily.             Triage Vitals: BP 105/68  Pulse 85  Temp 97.6 F (36.4 C) (Oral)  Resp 20  Ht 5\' 2"  (1.575 m)  Wt 180 lb (81.647 kg)  BMI 32.92 kg/m2  SpO2 100%  Physical Exam CONSTITUTIONAL: Well developed/well nourished HEAD AND FACE: Normocephalic/atraumatic EYES: EOMI/PERRL ENMT: Mucous membranes moist NECK: supple no meningeal signs SPINE: lumbar tenderness, No bruising/crepitance/stepoffs noted to spine No cervical or thoracic tenderness CV: S1/S2 noted, no murmurs/rubs/gallops noted LUNGS: Lungs are clear to auscultation bilaterally, no apparent distress ABDOMEN: soft, nontender,  no rebound or guarding GU:no cva tenderness RECTAL EXAM: stool color normal, no melena or blood, no mass noted hemoccult positive, chaperone present NEURO: Pt is awake/alert, moves all extremitiesx4, poor historian, mild expressive aphasia at baseline EXTREMITIES: pulses normal, full ROM, no deformity SKIN: warm, color normal PSYCH: no abnormalities of mood noted   ED Course  Procedures  DIAGNOSTIC STUDIES: Oxygen Saturation is 100% on room air, normal by my interpretation.    COORDINATION OF CARE: 15:27--I evaluated the patient and we discussed a treatment plan including blood work and urinalysis to which the pt and her husband agreed.  Pt is a poor historian due to previous stroke.  Seems most pertinent CC is her diarrhea.  She also felt weak while standing and fell onto her buttocks and back, will obtain xray.  She is otherwise stable and well appearing  17:08--I rechecked the pt and notified her that her labs and x-ray look fine.  17:37--I rechecked the pt and explained to her the rest of her results.   Her stool color was normal.  It was hem positive but normal HGB.  She reports one episode of dark diarrhea but no blood reported.  I doubt acute GI bleed.  She is well appearing, no distress, only reports mild back pain from fall, but she is ambulatory, no focal motor deficits.   Xray of lumbar spine negative.  Doubt occult fracture She has no focal abd tenderness.  Feel she is stable for d/c home She will f/u with PCP this week for recheck of diarrhea and hem positive stools She is taking PO and denied dizziness to me.     Labs Reviewed  URINALYSIS, ROUTINE W REFLEX MICROSCOPIC  CBC WITH DIFFERENTIAL  BASIC METABOLIC PANEL      MDM  Nursing notes including past medical history and social history reviewed and considered in documentation xrays reviewed and considered Labs/vital reviewed and considered       Date: 04/23/2012  Rate: 77  Rhythm: normal sinus rhythm   QRS Axis: left  Intervals: normal  ST/T Wave abnormalities: nonspecific ST changes  Conduction Disutrbances:right bundle branch block  Narrative Interpretation:   Old EKG Reviewed:  unchanged     I personally performed the services described in this documentation, which was scribed in my presence. The recorded information has been reviewed and is accurate.      Joya Gaskins, MD 04/23/12 [phone]

## 2012-04-23 NOTE — ED Notes (Signed)
Pt c/o n/v/d since Saturday.

## 2012-04-28 ENCOUNTER — Other Ambulatory Visit: Payer: Self-pay | Admitting: Family Medicine

## 2012-04-28 ENCOUNTER — Ambulatory Visit (HOSPITAL_COMMUNITY)
Admission: RE | Admit: 2012-04-28 | Discharge: 2012-04-28 | Disposition: A | Discharge status: Home / Self Care | Payer: Medicare Other | Source: Ambulatory Visit | Attending: Family Medicine | Admitting: Family Medicine

## 2012-04-28 DIAGNOSIS — R1013 Epigastric pain: Secondary | ICD-10-CM | POA: Insufficient documentation

## 2012-04-28 DIAGNOSIS — K838 Other specified diseases of biliary tract: Secondary | ICD-10-CM | POA: Diagnosis not present

## 2012-04-28 DIAGNOSIS — R1084 Generalized abdominal pain: Secondary | ICD-10-CM | POA: Diagnosis not present

## 2012-04-28 DIAGNOSIS — R109 Unspecified abdominal pain: Secondary | ICD-10-CM

## 2012-04-28 DIAGNOSIS — K5730 Diverticulosis of large intestine without perforation or abscess without bleeding: Secondary | ICD-10-CM | POA: Diagnosis not present

## 2012-04-28 LAB — URINE CULTURE

## 2012-04-28 MED ORDER — IOHEXOL 300 MG/ML  SOLN
100.0000 mL | Freq: Once | INTRAMUSCULAR | Status: AC | PRN
  Administered 2012-04-28: 100 mL via INTRAVENOUS

## 2012-04-30 NOTE — ED Notes (Signed)
Chart sent to EDP for review 

## 2012-05-02 ENCOUNTER — Telehealth (HOSPITAL_COMMUNITY): Payer: Self-pay | Admitting: Emergency Medicine

## 2012-05-03 NOTE — ED Notes (Signed)
Chart returned from EDP office. Per Felicie Morn NP, if patient is having S & S consistent with UTI, may need antibiotic--please have patient contact PCP if symptomatic.

## 2012-05-21 DIAGNOSIS — I10 Essential (primary) hypertension: Secondary | ICD-10-CM | POA: Diagnosis not present

## 2012-05-21 DIAGNOSIS — I6789 Other cerebrovascular disease: Secondary | ICD-10-CM | POA: Diagnosis not present

## 2012-05-21 DIAGNOSIS — E7800 Pure hypercholesterolemia, unspecified: Secondary | ICD-10-CM | POA: Diagnosis not present

## 2012-05-21 DIAGNOSIS — K219 Gastro-esophageal reflux disease without esophagitis: Secondary | ICD-10-CM | POA: Diagnosis not present

## 2012-06-19 DIAGNOSIS — J42 Unspecified chronic bronchitis: Secondary | ICD-10-CM | POA: Diagnosis not present

## 2012-06-19 DIAGNOSIS — J019 Acute sinusitis, unspecified: Secondary | ICD-10-CM | POA: Diagnosis not present

## 2012-07-03 ENCOUNTER — Ambulatory Visit (INDEPENDENT_AMBULATORY_CARE_PROVIDER_SITE_OTHER): Payer: Medicare Other | Admitting: Otolaryngology

## 2012-07-03 DIAGNOSIS — H903 Sensorineural hearing loss, bilateral: Secondary | ICD-10-CM | POA: Diagnosis not present

## 2012-07-03 DIAGNOSIS — H9319 Tinnitus, unspecified ear: Secondary | ICD-10-CM | POA: Diagnosis not present

## 2012-08-11 ENCOUNTER — Encounter: Payer: Self-pay | Admitting: *Deleted

## 2012-08-13 ENCOUNTER — Ambulatory Visit (INDEPENDENT_AMBULATORY_CARE_PROVIDER_SITE_OTHER): Payer: Medicare Other | Admitting: Family Medicine

## 2012-08-13 ENCOUNTER — Encounter: Payer: Self-pay | Admitting: Family Medicine

## 2012-08-13 VITALS — BP 148/86 | HR 80 | Wt 189.0 lb

## 2012-08-13 DIAGNOSIS — H9313 Tinnitus, bilateral: Secondary | ICD-10-CM

## 2012-08-13 DIAGNOSIS — I10 Essential (primary) hypertension: Secondary | ICD-10-CM

## 2012-08-13 DIAGNOSIS — H9319 Tinnitus, unspecified ear: Secondary | ICD-10-CM | POA: Diagnosis not present

## 2012-08-13 DIAGNOSIS — E785 Hyperlipidemia, unspecified: Secondary | ICD-10-CM | POA: Insufficient documentation

## 2012-08-13 DIAGNOSIS — J329 Chronic sinusitis, unspecified: Secondary | ICD-10-CM | POA: Diagnosis not present

## 2012-08-13 MED ORDER — CEFPROZIL 500 MG PO TABS: 500.0000 mg | ORAL_TABLET | Freq: Two times a day (BID) | ORAL | Status: DC

## 2012-08-13 NOTE — Progress Notes (Signed)
  Subjective:    Patient ID: [name], female    DOB: [birth year], 72 y.o.   MRN: [id number]  Headache   Muscle Pain Associated symptoms include headaches.   Patient is experiencing intermittent headaches. Diffuse in nature. Bilateral. Some ear fullness and congestion. However, on further history patient notes headaches have been going on pretty much since her stroke. Exercising some but not a lot. Claims compliance with all of her medications. No new stroke symptomatology. No chest pain no shortness of breath. Patient also notes achiness in her muscles and joints. Patient did not get blood work is requested.  Review of Systems  Neurological: Positive for headaches.   ROS otherwise negative.     Objective:   Physical Exam  Alert no acute distress. Distinct speech aphasia which compromises communication. Vital signs reviewed. Moderate nasal congestion. TMs somewhat contracted.  Lungs clear. Heart regular in rhythm. Neurological deficits are stable. Ankles no significant edema.    Assessment & Plan:  Impression #1 hypertension good control. #2 status post CVA. #3 possible sinusitis. #4 chronic headaches. #5 hyperlipidemia status uncertain. Plan appropriate blood work. Diet exercise discussed. Cefzil 500 twice a day 10 days. Easily 25 minutes spent most in discussion. Followup as scheduled. WSL

## 2012-08-25 ENCOUNTER — Other Ambulatory Visit: Payer: Self-pay | Admitting: Family Medicine

## 2012-09-01 ENCOUNTER — Encounter: Payer: Self-pay | Admitting: Nurse Practitioner

## 2012-09-02 ENCOUNTER — Ambulatory Visit (INDEPENDENT_AMBULATORY_CARE_PROVIDER_SITE_OTHER): Payer: Medicare Other | Admitting: Nurse Practitioner

## 2012-09-02 ENCOUNTER — Encounter: Payer: Self-pay | Admitting: Nurse Practitioner

## 2012-09-02 VITALS — BP 151/73 | HR 77 | Ht 63.0 in | Wt 189.0 lb

## 2012-09-02 DIAGNOSIS — I6350 Cerebral infarction due to unspecified occlusion or stenosis of unspecified cerebral artery: Secondary | ICD-10-CM

## 2012-09-02 DIAGNOSIS — R51 Headache: Secondary | ICD-10-CM

## 2012-09-02 DIAGNOSIS — R519 Headache, unspecified: Secondary | ICD-10-CM | POA: Insufficient documentation

## 2012-09-02 DIAGNOSIS — I69320 Aphasia following cerebral infarction: Secondary | ICD-10-CM

## 2012-09-02 DIAGNOSIS — I69920 Aphasia following unspecified cerebrovascular disease: Secondary | ICD-10-CM

## 2012-09-02 DIAGNOSIS — E785 Hyperlipidemia, unspecified: Secondary | ICD-10-CM | POA: Diagnosis not present

## 2012-09-02 MED ORDER — DIVALPROEX SODIUM ER 500 MG PO TB24: 500.0000 mg | ORAL_TABLET | Freq: Every day | ORAL | Status: DC

## 2012-09-02 NOTE — Patient Instructions (Addendum)
4 headache and hearing voices try Depakote 500 mg at bedtime only. Send copy of labs to our office Continue aspirin for secondary stroke prevention Continue Pravachol for elevated lipids Blood pressure systolic less than 135 Followup in 6 months

## 2012-09-02 NOTE — Progress Notes (Signed)
HPI: Patient returns for followup after initial evaluation 01/17/2012 for stroke. Her initial symptom was acute onset of language difficulty, no gait disturbance no weakness no blurry vision She has past medical history of hypertension hyperlipidemia and hyperthyroidism. MRI of the brain 12/20/2011 with acute left parietal lateral temperal stroke and moderate small vessel disease. Ultrasound of the carotid artery demonstrated complex calcified and noncalcified upper modus plaque in the bilateral carotid artery bulb extending into the proximal internal carotid artery with less than 50% stenosis bilaterally. Elevated cholesterol 225, LDL 145. She is currentlyon pravastatin. Her speech therapy has concluded. She denies double vision, loss of vision, focal weakness, numbness, swallowing problems, confusions, blackouts. She continues to have word finding problems. She also complains of diffuse intermittent headaches and hearing music. This happens about once a week , there is no loss of consciousness.   ROS:    Neg  Physical Exam General: well developed, well nourished, seated, in no evident distress Head: head normocephalic and atraumatic. Oropharynx benign Neck: supple with no carotid or supraclavicular bruits Cardiovascular: regular rate and rhythm, no murmurs  Neurologic Exam Mental Status: Awake and fully alert. Oriented to place and time. Recent and remote memory intact. Expressive difficulty with paraphasic errors.  Cranial Nerves: Fundoscopic exam reveals sharp disc margins. Pupils equal, briskly reactive to light. Extraocular movements full without nystagmus. Right visual field cut noted Visual fields full to confrontation. Hearing intact and symmetric to finger snap. Facial sensation intact. Face, tongue, palate move normally and symmetrically. Neck flexion and extension normal.  Motor: Normal bulk and tone. Normal strength in all tested extremity muscles. Sensory.: intact to touch and pinprick  and vibratory.  Coordination: Rapid alternating movements normal in all extremities. Finger-to-nose and heel-to-shin performed accurately bilaterally. Gait and Station: Arises from chair without difficulty. Stance is narrow based. Difficulty with turns, no assistive device, unsteady with tandem.  Reflexes: 2+ and symmetric except Achilles trace. Toes downgoing.     ASSESSMENT: Left parietal and lateral temporal lobe cortical stroke with moderate small vessel disease per MRI 12/19/2011. Patient hears music followed by diffuse headache about once a week no loss of consciousness.     PLAN: Continue aspirin for secondary stroke prevention For headache and hearing voices try Depakote 500 mg at bedtime only. Recent labs not done at Dr. Gerda Diss, made aware of importance of monitoring.  Continue Pravachol for elevated lipids, cholesterol was 200, LDL less than 100 Keep blood pressure systolic less than 135 systolic Discussed the importance of exercise, walking 10-15 minutes a day is a beginning Patient no showed for her 2D echo x2 Followup in 6 months Discussed above with Dr. Carmelina Noun, GNP-BC APRN

## 2012-09-03 ENCOUNTER — Other Ambulatory Visit: Payer: Self-pay | Admitting: Family Medicine

## 2012-09-05 DIAGNOSIS — E785 Hyperlipidemia, unspecified: Secondary | ICD-10-CM | POA: Diagnosis not present

## 2012-09-05 DIAGNOSIS — I10 Essential (primary) hypertension: Secondary | ICD-10-CM | POA: Diagnosis not present

## 2012-09-05 LAB — LIPID PANEL: LDL Cholesterol: 75 mg/dL (ref 0–99)

## 2012-09-05 LAB — HEPATIC FUNCTION PANEL
Albumin: 3.9 g/dL (ref 3.5–5.2)
Alkaline Phosphatase: 58 U/L (ref 39–117)
Bilirubin, Direct: 0.1 mg/dL (ref 0.0–0.3)
Indirect Bilirubin: 0.3 mg/dL (ref 0.0–0.9)
Total Bilirubin: 0.4 mg/dL (ref 0.3–1.2)

## 2012-09-10 ENCOUNTER — Encounter: Payer: Self-pay | Admitting: Family Medicine

## 2012-09-30 ENCOUNTER — Encounter: Payer: Self-pay | Admitting: Family Medicine

## 2012-09-30 ENCOUNTER — Ambulatory Visit: Payer: Medicare Other | Admitting: Family Medicine

## 2012-09-30 ENCOUNTER — Ambulatory Visit (INDEPENDENT_AMBULATORY_CARE_PROVIDER_SITE_OTHER): Payer: Medicare Other | Admitting: Family Medicine

## 2012-09-30 VITALS — BP 132/72 | Temp 98.8°F | Wt 186.8 lb

## 2012-09-30 DIAGNOSIS — J019 Acute sinusitis, unspecified: Secondary | ICD-10-CM | POA: Diagnosis not present

## 2012-09-30 DIAGNOSIS — R300 Dysuria: Secondary | ICD-10-CM | POA: Diagnosis not present

## 2012-09-30 LAB — POCT URINALYSIS DIPSTICK
Spec Grav, UA: 1.015
pH, UA: 5

## 2012-09-30 MED ORDER — CEFPROZIL 500 MG PO TABS: 500.0000 mg | ORAL_TABLET | Freq: Two times a day (BID) | ORAL | Status: DC

## 2012-09-30 NOTE — Progress Notes (Signed)
  Subjective:    Patient ID: [name], female    DOB: [birth year], 72 y.o.   MRN: [id number]  Cough This is a new problem. The current episode started in the past 7 days. Associated symptoms include a sore throat.  Patient with some intermittent allergies runny nose little bit of cough symptoms progressive over the past several days with increasing cough congestion sinus pressure not feeling good denies high fever chills also has slight dysuria no diarrhea. Past medical history-Bronchitis hypertension reflux hypothyroidism Family history noncontributory Social-smoking history    Review of Systems  HENT: Positive for sore throat.   Respiratory: Positive for cough.        Objective:   Physical Exam Eardrums are normal throat is normal neck no masses mild sinus tenderness lungs clear cough is noted not rest for distress extremities no edema   Urinalysis negative    Assessment & Plan:  Acute sinusitis-Cefzil twice a day 10 days if high fevers difficulty breathing or worse followup. May use over-the-counter allergy tablet as necessary.

## 2012-09-30 NOTE — Patient Instructions (Signed)
If fevers or worse call  Take cefzil 2 times a day for 10 days

## 2012-10-13 ENCOUNTER — Other Ambulatory Visit: Payer: Self-pay | Admitting: Family Medicine

## 2012-10-13 NOTE — Telephone Encounter (Signed)
wis 5 ref 

## 2012-10-20 DIAGNOSIS — H2510 Age-related nuclear cataract, unspecified eye: Secondary | ICD-10-CM | POA: Diagnosis not present

## 2012-10-27 ENCOUNTER — Other Ambulatory Visit: Payer: Self-pay | Admitting: Family Medicine

## 2012-12-03 ENCOUNTER — Ambulatory Visit: Payer: Medicare Other | Admitting: Family Medicine

## 2012-12-03 ENCOUNTER — Encounter: Payer: Self-pay | Admitting: Family Medicine

## 2012-12-03 ENCOUNTER — Ambulatory Visit (INDEPENDENT_AMBULATORY_CARE_PROVIDER_SITE_OTHER): Payer: Medicare Other | Admitting: Family Medicine

## 2012-12-03 VITALS — BP 128/70 | Temp 98.6°F | Wt 193.0 lb

## 2012-12-03 DIAGNOSIS — J329 Chronic sinusitis, unspecified: Secondary | ICD-10-CM

## 2012-12-03 MED ORDER — AMOXICILLIN 500 MG PO TABS: 500.0000 mg | ORAL_TABLET | Freq: Two times a day (BID) | ORAL | Status: DC

## 2012-12-03 NOTE — Progress Notes (Signed)
  Subjective:    Patient ID: [name], female    DOB: Feb 28, 1941, 72 y.o.   MRN: [id number]  HPI Patient's history is somewhat compromised by her speech impediment. She notes congestion. Drainage. Sore throat intermittently. Headache. Frontal in nature. Also fullness in ears. Occasional cough productive in nature no pain no shortness of breath   Review of Systems ROS otherwise negative    Objective:   Physical Exam  Alert no apparent distress HEENT moderate nasal congestion. TMs somewhat retracted neck supple. Lungs clear heart regular in rhythm.      Assessment & Plan:  Impression sinusitis and otitis. Plan a mocks 500 3 times a day 10 days. Symptomatic care discussed. WSL

## 2012-12-12 ENCOUNTER — Other Ambulatory Visit: Payer: Self-pay | Admitting: Family Medicine

## 2012-12-12 NOTE — Telephone Encounter (Signed)
Ok five montly ref

## 2012-12-15 ENCOUNTER — Other Ambulatory Visit: Payer: Self-pay | Admitting: Family Medicine

## 2012-12-16 ENCOUNTER — Other Ambulatory Visit: Payer: Self-pay | Admitting: Family Medicine

## 2013-01-20 ENCOUNTER — Ambulatory Visit (INDEPENDENT_AMBULATORY_CARE_PROVIDER_SITE_OTHER): Payer: Medicare Other | Admitting: Family Medicine

## 2013-01-20 ENCOUNTER — Encounter: Payer: Self-pay | Admitting: Family Medicine

## 2013-01-20 VITALS — BP 142/80 | Temp 98.5°F | Ht 63.0 in | Wt 189.0 lb

## 2013-01-20 DIAGNOSIS — J209 Acute bronchitis, unspecified: Secondary | ICD-10-CM | POA: Diagnosis not present

## 2013-01-20 MED ORDER — CEPHALEXIN 500 MG PO CAPS: 500.0000 mg | ORAL_CAPSULE | Freq: Three times a day (TID) | ORAL | Status: AC

## 2013-01-20 NOTE — Progress Notes (Signed)
  Subjective:    Patient ID: [name], female    DOB: 1940-06-05, 72 y.o.   MRN: [id number]  HPI Here today for bronchitis. She states it started 3 weeks ago. This is a reoccurring event that she has.   Cough is productive, no obvious fever. Diminished energy. Somewhat achy.  Review of Systems No vomiting no diarrhea no chest pain no swelling in the feet. Compliant with medications.    Objective:   Physical Exam  Alert no acute distress. Baseline difficulty with speech HEENT moderate nasal congestion pharynx slight erythema neck supple. Lungs no wheezes occasional bronchial cough heart regular in rhythm.      Assessment & Plan:  Impression subacute bronchitis discussed with patient. Plan symptomatic care discussed. Appropriate antibiotics. Warning signs discussed. WSL

## 2013-02-03 ENCOUNTER — Telehealth: Payer: Self-pay | Admitting: Family Medicine

## 2013-02-03 ENCOUNTER — Other Ambulatory Visit: Payer: Self-pay | Admitting: Family Medicine

## 2013-02-03 MED ORDER — AMOXICILLIN-POT CLAVULANATE 875-125 MG PO TABS: 1.0000 | ORAL_TABLET | Freq: Two times a day (BID) | ORAL | Status: AC

## 2013-02-03 NOTE — Telephone Encounter (Signed)
Medication sent to pharmacy. Patient was notified.  

## 2013-02-03 NOTE — Telephone Encounter (Signed)
Aug 875 bid ten d 

## 2013-02-03 NOTE — Telephone Encounter (Signed)
Patient was seen 01/20/2013 and given an antibiotic.  Completed the medication as directed.  However, patient is experiencing symptoms such as Sinus Pressure, Right side of face swelling, headache.  Would like to know if another round of antibiotics can be phoned into [facility] [place].  Please call Patient. Thanks

## 2013-02-03 NOTE — Telephone Encounter (Signed)
On 01/20/13 Keflex 500 mg TID x 10 days was given.

## 2013-02-11 ENCOUNTER — Other Ambulatory Visit: Payer: Self-pay | Admitting: Family Medicine

## 2013-02-21 ENCOUNTER — Other Ambulatory Visit: Payer: Self-pay | Admitting: Family Medicine

## 2013-02-25 DIAGNOSIS — Z23 Encounter for immunization: Secondary | ICD-10-CM | POA: Diagnosis not present

## 2013-03-04 ENCOUNTER — Ambulatory Visit: Payer: Medicare Other | Admitting: Nurse Practitioner

## 2013-03-05 ENCOUNTER — Other Ambulatory Visit: Payer: Self-pay | Admitting: Family Medicine

## 2013-03-11 ENCOUNTER — Telehealth: Payer: Self-pay | Admitting: Family Medicine

## 2013-03-11 MED ORDER — HYDROCODONE-ACETAMINOPHEN 10-325 MG PO TABS: ORAL_TABLET | ORAL | Status: DC

## 2013-03-11 NOTE — Telephone Encounter (Signed)
Last seen for chronic visit 08/13/12

## 2013-03-11 NOTE — Telephone Encounter (Signed)
Patient needs a refill on HYDROcodone-acetaminophen (NORCO) 10-325 MG per tablet.   Patient is completely out of this medication.  Can Dr. Brett Canales or Eber Jones complete this request due to Dr. Lorin Picket being out of the office this afternoon.  Please call patient to let her know if we can complete this request.

## 2013-03-11 NOTE — Telephone Encounter (Signed)
Rx up front for patient pick up. Patient notified and advised to schedule follow up office visit.

## 2013-03-11 NOTE — Telephone Encounter (Signed)
Ok times one o v before furhter

## 2013-04-13 ENCOUNTER — Telehealth: Payer: Self-pay | Admitting: Family Medicine

## 2013-04-13 ENCOUNTER — Other Ambulatory Visit: Payer: Self-pay | Admitting: Family Medicine

## 2013-04-13 MED ORDER — HYDROCODONE-ACETAMINOPHEN 10-325 MG PO TABS: ORAL_TABLET | ORAL | Status: DC

## 2013-04-13 NOTE — Telephone Encounter (Signed)
Ok plus 5 monthy ref

## 2013-04-13 NOTE — Telephone Encounter (Signed)
Patient needs Rx for her pain medication until her next appointment.

## 2013-04-13 NOTE — Telephone Encounter (Signed)
Notified husband that script is ready for pickup.

## 2013-04-13 NOTE — Telephone Encounter (Signed)
Needs refill on hydrocodone. Next appt 04/17/13

## 2013-04-13 NOTE — Telephone Encounter (Signed)
wis one mo

## 2013-04-17 ENCOUNTER — Ambulatory Visit (INDEPENDENT_AMBULATORY_CARE_PROVIDER_SITE_OTHER): Payer: Medicare Other | Admitting: Family Medicine

## 2013-04-17 ENCOUNTER — Encounter: Payer: Self-pay | Admitting: Family Medicine

## 2013-04-17 VITALS — BP 148/80 | Ht 62.0 in | Wt 193.2 lb

## 2013-04-17 DIAGNOSIS — E039 Hypothyroidism, unspecified: Secondary | ICD-10-CM | POA: Diagnosis not present

## 2013-04-17 DIAGNOSIS — Z79899 Other long term (current) drug therapy: Secondary | ICD-10-CM

## 2013-04-17 DIAGNOSIS — E782 Mixed hyperlipidemia: Secondary | ICD-10-CM | POA: Diagnosis not present

## 2013-04-17 DIAGNOSIS — R5381 Other malaise: Secondary | ICD-10-CM

## 2013-04-17 NOTE — Progress Notes (Signed)
   Subjective:    Patient ID: [name], female    DOB: 23-Nov-1940, 72 y.o.   MRN: [id number]  HPI Patient is here today for a refill on her lisinopril.  She would like to discuss her medications. She is confused with some of them.   Takes thyroid faithfully. Tries not to miss a dose.rx'ed in the past by endocrinologist. [name] if this is a pleasant is states that it   Seeand kidneys managed by us.s neurologist for stroke, started on depakote in the past for headaches and hearing voices. Pt states doesn't help. States canno t affor d neurologist states it costs $800 the last time she went to a neurologist   No headache or chest pain  Trying to wtch her diet  Reports that her speech is improving. No focal weakness   Review of Systems  no chest pain no headache no shortness breath no abdominal pain no change in bowel habits no blood in stools ROS otherwise negative     Objective:   Physical Exam   alert HEENT normal. Lungs clear. Heart regular rate and rhythm. Ankles edema. No focal neurological deficits. Speech patterns have improved.       Assessment & Plan:   impression 1 status post stroke with improvement in speech #2 hypertension good control #3 hypothyroidism status uncertain. #4 status post stroke patient states Depakote not helping. Is now noticing neurologist. Plan go ahead and hold off on Depakote. Check appropriate blood work. Diet exercise discussed. Recheck in several months. WSL

## 2013-04-19 DIAGNOSIS — E039 Hypothyroidism, unspecified: Secondary | ICD-10-CM | POA: Insufficient documentation

## 2013-05-01 DIAGNOSIS — R5381 Other malaise: Secondary | ICD-10-CM | POA: Diagnosis not present

## 2013-05-01 DIAGNOSIS — E782 Mixed hyperlipidemia: Secondary | ICD-10-CM | POA: Diagnosis not present

## 2013-05-01 DIAGNOSIS — Z79899 Other long term (current) drug therapy: Secondary | ICD-10-CM | POA: Diagnosis not present

## 2013-05-01 LAB — HEPATIC FUNCTION PANEL
ALT: 9 U/L (ref 0–35)
AST: 18 U/L (ref 0–37)
Albumin: 3.9 g/dL (ref 3.5–5.2)
Alkaline Phosphatase: 63 U/L (ref 39–117)
Bilirubin, Direct: 0.1 mg/dL (ref 0.0–0.3)

## 2013-05-01 LAB — LIPID PANEL
Cholesterol: 184 mg/dL (ref 0–200)
HDL: 77 mg/dL (ref >39)
Total CHOL/HDL Ratio: 2.4 Ratio
Triglycerides: 105 mg/dL (ref <150)

## 2013-05-01 LAB — TSH: TSH: 1.728 u[IU]/mL (ref 0.350–4.500)

## 2013-05-10 ENCOUNTER — Encounter: Payer: Self-pay | Admitting: Family Medicine

## 2013-05-11 ENCOUNTER — Other Ambulatory Visit: Payer: Self-pay | Admitting: Family Medicine

## 2013-05-15 ENCOUNTER — Telehealth: Payer: Self-pay | Admitting: Family Medicine

## 2013-05-15 MED ORDER — HYDROCODONE-ACETAMINOPHEN 10-325 MG PO TABS: ORAL_TABLET | ORAL | Status: DC

## 2013-05-15 NOTE — Telephone Encounter (Signed)
ALPRAZolam (XANAX) 1 MG tablet  DROcodone-acetaminophen (NORCO) 10-325 MG per tablet   Please have refill done as soon as possible.

## 2013-05-15 NOTE — Telephone Encounter (Signed)
Patient notified

## 2013-05-15 NOTE — Telephone Encounter (Signed)
Give refill of hydrocodone

## 2013-06-12 ENCOUNTER — Other Ambulatory Visit: Payer: Self-pay | Admitting: *Deleted

## 2013-06-12 ENCOUNTER — Other Ambulatory Visit: Payer: Self-pay | Admitting: Family Medicine

## 2013-06-12 ENCOUNTER — Telehealth: Payer: Self-pay | Admitting: Family Medicine

## 2013-06-12 MED ORDER — HYDROCODONE-ACETAMINOPHEN 10-325 MG PO TABS: ORAL_TABLET | ORAL | Status: DC

## 2013-06-12 NOTE — Telephone Encounter (Signed)
Ok times one, remind pt have to see every three months

## 2013-06-12 NOTE — Telephone Encounter (Signed)
Pt notified rx ready for pick up.

## 2013-06-12 NOTE — Telephone Encounter (Signed)
HYDROcodone-acetaminophen (NORCO) 10-325 MG per tablet    Please refill, she knows it is early but today is the only day her  Husband can come pick up the script  Please refill with future date to fill please

## 2013-06-13 ENCOUNTER — Other Ambulatory Visit: Payer: Self-pay | Admitting: Family Medicine

## 2013-06-26 ENCOUNTER — Other Ambulatory Visit: Payer: Self-pay | Admitting: Family Medicine

## 2013-07-15 ENCOUNTER — Telehealth: Payer: Self-pay | Admitting: Family Medicine

## 2013-07-15 MED ORDER — HYDROCODONE-ACETAMINOPHEN 10-325 MG PO TABS: ORAL_TABLET | ORAL | Status: DC

## 2013-07-15 NOTE — Telephone Encounter (Signed)
Pt having confusion issues, ok times one encourage ov begfore further

## 2013-07-15 NOTE — Telephone Encounter (Signed)
HYDROcodone-acetaminophen (NORCO) 10-325 MG per tablet   Refill please  Last seen 04/17/13 Last filled 06/12/13

## 2013-07-15 NOTE — Telephone Encounter (Signed)
Patient notified and verbalized understanding. 

## 2013-07-29 ENCOUNTER — Other Ambulatory Visit: Payer: Self-pay | Admitting: Family Medicine

## 2013-08-13 ENCOUNTER — Telehealth: Payer: Self-pay | Admitting: Family Medicine

## 2013-08-13 MED ORDER — HYDROCODONE-ACETAMINOPHEN 10-325 MG PO TABS: ORAL_TABLET | ORAL | Status: DC

## 2013-08-13 NOTE — Telephone Encounter (Signed)
Num [phone]

## 2013-08-13 NOTE — Telephone Encounter (Signed)
Script ready for pickup. Pt's husband notified.  

## 2013-08-13 NOTE — Telephone Encounter (Signed)
HYDROcodone-acetaminophen (NORCO) 10-325 MG per tablet  pts spouse calling to make her an appt for Monday and wants to know  If we can give her enough pills to get through to the appt on Monday. She currently has 8 pills and takes 3 PO Daily.   Advised pts spouse she may need to make the 8 pills she has last till [facility] appt on Monday afternoon and that they need to call sooner for med check  Ups so she don't run out of meds in the future.

## 2013-08-13 NOTE — Telephone Encounter (Signed)
Last seen 04/17/13  Last filled 07/15/13

## 2013-08-17 ENCOUNTER — Ambulatory Visit (INDEPENDENT_AMBULATORY_CARE_PROVIDER_SITE_OTHER): Payer: Medicare Other | Admitting: Family Medicine

## 2013-08-17 ENCOUNTER — Encounter: Payer: Self-pay | Admitting: Family Medicine

## 2013-08-17 VITALS — BP 138/86 | Ht 62.0 in | Wt 188.2 lb

## 2013-08-17 DIAGNOSIS — I6350 Cerebral infarction due to unspecified occlusion or stenosis of unspecified cerebral artery: Secondary | ICD-10-CM | POA: Diagnosis not present

## 2013-08-17 DIAGNOSIS — K219 Gastro-esophageal reflux disease without esophagitis: Secondary | ICD-10-CM | POA: Diagnosis not present

## 2013-08-17 DIAGNOSIS — I69920 Aphasia following unspecified cerebrovascular disease: Secondary | ICD-10-CM

## 2013-08-17 DIAGNOSIS — I69320 Aphasia following cerebral infarction: Secondary | ICD-10-CM

## 2013-08-17 DIAGNOSIS — E785 Hyperlipidemia, unspecified: Secondary | ICD-10-CM

## 2013-08-17 DIAGNOSIS — I10 Essential (primary) hypertension: Secondary | ICD-10-CM

## 2013-08-17 DIAGNOSIS — E039 Hypothyroidism, unspecified: Secondary | ICD-10-CM

## 2013-08-17 MED ORDER — HYDROCODONE-ACETAMINOPHEN 10-325 MG PO TABS: ORAL_TABLET | ORAL | Status: DC

## 2013-08-17 NOTE — Progress Notes (Signed)
   Subjective:    Patient ID: [name], female    DOB: [birth year], 73 y.o.   MRN: [id number]  HPI Patient arrives for a follow up on cholesterol and blood pressure. Patient claims compliance with her cholesterol medicine. No obvious side effects. Blood work check in the summer was good. Patient states still swollen type O.  Handling blood pressure medicine well. No obvious side effects. Blood pressure good when checked elsewhere. Does not medicine does.  No further history of TIA or stroke symptomatology. Patient improved notes improvement with speech ongoing.  Patient reports dark urine. Sometimes irritated. No significant dysuria no flank pain no fever no chills negative workup in past.   Patient reports no problems or concerns.   Review of Systems No headache no chest pain no back pain no abdominal pain no change in bowel habits no blood in stool ROS otherwise    Objective:   Physical Exam Alert oriented. Vital stable. HEENT normal. Lungs clear. Heart regular in rhythm. No focal neurological deficit. Slight dysphasia persists.       Assessment & Plan:  Impression 1 status post stroke #2 hypertension good control #3 hyperlipidemia recent good control. #4 urinary concerns with negative workup in the past. Plan maintain same meds. Diet exercise discussed. Recheck in several months. Blood work then. WSL

## 2013-08-31 ENCOUNTER — Other Ambulatory Visit: Payer: Self-pay | Admitting: Family Medicine

## 2013-09-08 ENCOUNTER — Other Ambulatory Visit: Payer: Self-pay | Admitting: Family Medicine

## 2013-09-15 ENCOUNTER — Telehealth: Payer: Self-pay | Admitting: Family Medicine

## 2013-09-15 MED ORDER — NORTRIPTYLINE HCL 50 MG PO CAPS: ORAL_CAPSULE | ORAL | Status: DC

## 2013-09-15 NOTE — Telephone Encounter (Signed)
Medication sent to pharmacy. Patient was notified.  

## 2013-09-15 NOTE — Telephone Encounter (Signed)
nortriptyline (PAMELOR) 50 MG capsule  [facility]  Please refill, [facility] to reach [place] several Times but we won't respond  Last seen 08/17/13 Last filled  02/11/13

## 2013-09-23 ENCOUNTER — Other Ambulatory Visit: Payer: Self-pay | Admitting: Family Medicine

## 2013-10-03 ENCOUNTER — Other Ambulatory Visit: Payer: Self-pay | Admitting: Family Medicine

## 2013-10-12 ENCOUNTER — Other Ambulatory Visit: Payer: Self-pay | Admitting: Family Medicine

## 2013-10-12 NOTE — Telephone Encounter (Signed)
Ok plus two ref 

## 2013-10-28 ENCOUNTER — Other Ambulatory Visit: Payer: Self-pay | Admitting: Family Medicine

## 2013-10-28 NOTE — Telephone Encounter (Signed)
Last seen 08/17/13.

## 2013-11-11 ENCOUNTER — Encounter: Payer: Self-pay | Admitting: Family Medicine

## 2013-11-11 ENCOUNTER — Ambulatory Visit (INDEPENDENT_AMBULATORY_CARE_PROVIDER_SITE_OTHER): Payer: Medicare Other | Admitting: Family Medicine

## 2013-11-11 VITALS — BP 148/86 | Ht 62.0 in | Wt 186.8 lb

## 2013-11-11 DIAGNOSIS — E782 Mixed hyperlipidemia: Secondary | ICD-10-CM

## 2013-11-11 DIAGNOSIS — E038 Other specified hypothyroidism: Secondary | ICD-10-CM | POA: Diagnosis not present

## 2013-11-11 DIAGNOSIS — E039 Hypothyroidism, unspecified: Secondary | ICD-10-CM

## 2013-11-11 DIAGNOSIS — I10 Essential (primary) hypertension: Secondary | ICD-10-CM

## 2013-11-11 DIAGNOSIS — I6350 Cerebral infarction due to unspecified occlusion or stenosis of unspecified cerebral artery: Secondary | ICD-10-CM | POA: Diagnosis not present

## 2013-11-11 DIAGNOSIS — Z79899 Other long term (current) drug therapy: Secondary | ICD-10-CM

## 2013-11-11 MED ORDER — HYDROCODONE-ACETAMINOPHEN 10-325 MG PO TABS: ORAL_TABLET | ORAL | Status: DC

## 2013-11-11 NOTE — Progress Notes (Signed)
   Subjective:    Patient ID: [name], female    DOB: [birth year], 73 y.o.   MRN: [id number]  Hypertension This is a chronic problem. The current episode started more than 1 year ago. Risk factors for coronary artery disease include dyslipidemia and post-menopausal state. Treatments tried: lisinopril.    Patient having problems with pressure in head and pain in legs. On further history the pressure in the head is really mild generally not significant.  Ongoing back pain. Ongoing leg pain. Definitely needs her hydrocodone for sufficient control of symptoms.  No new symptoms of stroke.  Compliant with blood pressure medicine. No obvious side effects from it.   Hypothyroidism medicine compliant. No symptoms of high or low thyroid.  Compliant with lipid medicine. Trying to watch diet. Has cut down fats. Review of Systems No chest pain no severe headache no loss of consciousness no abdominal pain change in bowel habits ROS otherwise negative    Objective:   Physical Exam  Alert mild malaise no acute distress. Vitals stable. Lungs clear. Heart rare rhythm. Ankles without edema palpitations noted in the knees. Plus minus straight leg raise bilateral      Assessment & Plan:  Impression 1 hypertension good control #2 hypothyroidism status uncertain. 3 hyperlipidemia status uncertain. #4 status post stroke stable #5 chronic pain considerable plan pain meds refilled. Appropriate blood work. Diet exercise discussed. Recheck in several months. WSL

## 2013-12-03 ENCOUNTER — Encounter: Payer: Self-pay | Admitting: Family Medicine

## 2013-12-03 ENCOUNTER — Ambulatory Visit (INDEPENDENT_AMBULATORY_CARE_PROVIDER_SITE_OTHER): Payer: Medicare Other | Admitting: Family Medicine

## 2013-12-03 VITALS — BP 130/82 | Resp 16 | Ht 62.0 in | Wt 189.0 lb

## 2013-12-03 DIAGNOSIS — I6350 Cerebral infarction due to unspecified occlusion or stenosis of unspecified cerebral artery: Secondary | ICD-10-CM

## 2013-12-03 DIAGNOSIS — J019 Acute sinusitis, unspecified: Secondary | ICD-10-CM | POA: Diagnosis not present

## 2013-12-03 MED ORDER — CEFPROZIL 500 MG PO TABS: 500.0000 mg | ORAL_TABLET | Freq: Two times a day (BID) | ORAL | Status: DC

## 2013-12-03 NOTE — Progress Notes (Signed)
   Subjective:    Patient ID: [name], female    DOB: [birth year], 73 y.o.   MRN: [id number]  HPI Patient has been having a very bad sore throat. And per patient the skin on the inside of her mouth has been coming out .And her mouth is so very sore.  She relates she's had head congestion drainage coughing soreness in the throat denies high fever chills sweats. Review of Systems  Constitutional: Negative for fever and activity change.  HENT: Positive for sore throat. Negative for congestion, ear pain and rhinorrhea.   Eyes: Negative for discharge.  Respiratory: Negative for cough, shortness of breath and wheezing.   Cardiovascular: Negative for chest pain.       Objective:   Physical Exam  Nursing note and vitals reviewed. Constitutional: She appears well-developed.  HENT:  Head: Normocephalic.  Nose: Nose normal.  Mouth/Throat: Oropharynx is clear and moist. No oropharyngeal exudate.  Neck: Neck supple.  Cardiovascular: Normal rate and normal heart sounds.   No murmur heard. Pulmonary/Chest: Effort normal and breath sounds normal. She has no wheezes.  Lymphadenopathy:    She has no cervical adenopathy.  Skin: Skin is warm and dry.          Assessment & Plan:  Upper respiratory illness sinusitis-antibiotics prescribed warning signs discussed followup if ongoing troubles

## 2013-12-07 DIAGNOSIS — E039 Hypothyroidism, unspecified: Secondary | ICD-10-CM | POA: Diagnosis not present

## 2013-12-07 DIAGNOSIS — Z79899 Other long term (current) drug therapy: Secondary | ICD-10-CM | POA: Diagnosis not present

## 2013-12-07 DIAGNOSIS — E782 Mixed hyperlipidemia: Secondary | ICD-10-CM | POA: Diagnosis not present

## 2013-12-07 LAB — BASIC METABOLIC PANEL
BUN: 16 mg/dL (ref 6–23)
CO2: 29 mEq/L (ref 19–32)
Calcium: 9.1 mg/dL (ref 8.4–10.5)
Chloride: 106 mEq/L (ref 96–112)
Creat: 0.84 mg/dL (ref 0.50–1.10)
Glucose, Bld: 84 mg/dL (ref 70–99)
POTASSIUM: 5.2 meq/L (ref 3.5–5.3)
SODIUM: 140 meq/L (ref 135–145)

## 2013-12-07 LAB — HEPATIC FUNCTION PANEL
ALBUMIN: 3.6 g/dL (ref 3.5–5.2)
ALT: 10 U/L (ref 0–35)
AST: 17 U/L (ref 0–37)
Alkaline Phosphatase: 60 U/L (ref 39–117)
BILIRUBIN DIRECT: 0.1 mg/dL (ref 0.0–0.3)
Indirect Bilirubin: 0.2 mg/dL (ref 0.2–1.2)
TOTAL PROTEIN: 6 g/dL (ref 6.0–8.3)
Total Bilirubin: 0.3 mg/dL (ref 0.2–1.2)

## 2013-12-07 LAB — LIPID PANEL
CHOL/HDL RATIO: 2.5 ratio
CHOLESTEROL: 178 mg/dL (ref 0–200)
HDL: 70 mg/dL (ref >39)
LDL Cholesterol: 80 mg/dL (ref 0–99)
TRIGLYCERIDES: 142 mg/dL (ref <150)
VLDL: 28 mg/dL (ref 0–40)

## 2013-12-07 LAB — TSH: TSH: 2.681 u[IU]/mL (ref 0.350–4.500)

## 2013-12-10 ENCOUNTER — Encounter: Payer: Self-pay | Admitting: Family Medicine

## 2013-12-12 ENCOUNTER — Other Ambulatory Visit: Payer: Self-pay | Admitting: Family Medicine

## 2014-01-11 ENCOUNTER — Other Ambulatory Visit: Payer: Self-pay | Admitting: Family Medicine

## 2014-01-11 NOTE — Telephone Encounter (Signed)
6 mo worth both 

## 2014-01-12 ENCOUNTER — Other Ambulatory Visit: Payer: Self-pay | Admitting: Family Medicine

## 2014-01-15 ENCOUNTER — Other Ambulatory Visit: Payer: Self-pay | Admitting: Family Medicine

## 2014-01-15 NOTE — Telephone Encounter (Signed)
6 mo worth ok 

## 2014-01-25 ENCOUNTER — Other Ambulatory Visit: Payer: Self-pay | Admitting: Family Medicine

## 2014-01-25 ENCOUNTER — Telehealth: Payer: Self-pay | Admitting: Family Medicine

## 2014-01-25 NOTE — Telephone Encounter (Signed)
Discussed with patient. Patient verbalized understanding. 

## 2014-01-25 NOTE — Telephone Encounter (Signed)
Call pt and give her some of her numbers al perfect

## 2014-01-25 NOTE — Telephone Encounter (Signed)
Ok plus one ref 

## 2014-01-25 NOTE — Telephone Encounter (Signed)
Patient calling to get results to her blood work that she had done at the end of July.

## 2014-01-28 ENCOUNTER — Other Ambulatory Visit: Payer: Self-pay | Admitting: Family Medicine

## 2014-02-08 ENCOUNTER — Other Ambulatory Visit: Payer: Self-pay | Admitting: *Deleted

## 2014-02-08 MED ORDER — ALENDRONATE SODIUM 70 MG PO TABS: ORAL_TABLET | ORAL | Status: DC

## 2014-02-08 MED ORDER — PAROXETINE HCL 40 MG PO TABS: ORAL_TABLET | ORAL | Status: DC

## 2014-02-08 MED ORDER — LEVOTHYROXINE SODIUM 112 MCG PO TABS: ORAL_TABLET | ORAL | Status: DC

## 2014-02-08 MED ORDER — PANTOPRAZOLE SODIUM 40 MG PO TBEC: DELAYED_RELEASE_TABLET | ORAL | Status: DC

## 2014-02-08 MED ORDER — NORTRIPTYLINE HCL 50 MG PO CAPS: ORAL_CAPSULE | ORAL | Status: DC

## 2014-02-08 MED ORDER — PRAVASTATIN SODIUM 20 MG PO TABS: ORAL_TABLET | ORAL | Status: DC

## 2014-02-11 ENCOUNTER — Telehealth: Payer: Self-pay | Admitting: Family Medicine

## 2014-02-11 ENCOUNTER — Other Ambulatory Visit: Payer: Self-pay | Admitting: *Deleted

## 2014-02-11 MED ORDER — HYDROCODONE-ACETAMINOPHEN 10-325 MG PO TABS: ORAL_TABLET | ORAL | Status: DC

## 2014-02-11 NOTE — Telephone Encounter (Signed)
HYDROcodone-acetaminophen (NORCO) 10-325 MG per tablet  Last filled 01/10/14  Last seen 11/11/13

## 2014-02-11 NOTE — Telephone Encounter (Signed)
Name refill x1 schedule office visit in October

## 2014-02-11 NOTE — Telephone Encounter (Signed)
Left message notifying pt that script is ready and she needs office visit in oct.

## 2014-02-16 ENCOUNTER — Ambulatory Visit: Payer: Medicare Other | Admitting: Family Medicine

## 2014-02-18 DIAGNOSIS — Z23 Encounter for immunization: Secondary | ICD-10-CM | POA: Diagnosis not present

## 2014-02-23 ENCOUNTER — Ambulatory Visit (INDEPENDENT_AMBULATORY_CARE_PROVIDER_SITE_OTHER): Payer: Medicare Other | Admitting: Family Medicine

## 2014-02-23 ENCOUNTER — Encounter: Payer: Self-pay | Admitting: Family Medicine

## 2014-02-23 VITALS — BP 128/84 | Ht 62.0 in | Wt 183.0 lb

## 2014-02-23 DIAGNOSIS — Z13820 Encounter for screening for osteoporosis: Secondary | ICD-10-CM

## 2014-02-23 DIAGNOSIS — M859 Disorder of bone density and structure, unspecified: Secondary | ICD-10-CM

## 2014-02-23 DIAGNOSIS — M8580 Other specified disorders of bone density and structure, unspecified site: Secondary | ICD-10-CM

## 2014-02-23 DIAGNOSIS — I639 Cerebral infarction, unspecified: Secondary | ICD-10-CM

## 2014-02-23 MED ORDER — HYDROCODONE-ACETAMINOPHEN 10-325 MG PO TABS: ORAL_TABLET | ORAL | Status: DC

## 2014-02-23 NOTE — Progress Notes (Signed)
   Subjective:    Patient ID: [name], female    DOB: [birth year], 73 y.o.   MRN: [id number]  Hypertension This is a chronic problem. The current episode started more than 1 year ago. Risk factors for coronary artery disease include obesity and post-menopausal state. Treatments tried: lisinopril. There are no compliance problems.    History of osteoporosis. No recent bone density test.  Compliant with blood pressure medication. No obvious side effect. Watching salt intake.  No new stroke symptoms.  Reports mood is stable. Flu shot givn at the drug store  Review of Systems No headache no chest pain no back pain on dumping no change in bowel habits no blood in stool    Objective:   Physical Exam  Alert HEENT normal. Vitals stable. Lungs clear. Heart regular rate and rhythm. Patient still having challenges with dysphasia during history. Ankles no significant edema      Assessment & Plan:  Impression 1 hypertension good control #2 status post stroke clinically stable #3 chronic pain discussed patient needs medication help maintain function #4 osteoporosis status uncertain plan pain medicine refill. Diet exercise discussed. Flu shot are given. Bone density test. Recheck in several months. WS

## 2014-03-01 ENCOUNTER — Ambulatory Visit (HOSPITAL_COMMUNITY)
Admission: RE | Admit: 2014-03-01 | Discharge: 2014-03-01 | Disposition: A | Discharge status: Home / Self Care | Payer: Medicare Other | Source: Ambulatory Visit | Attending: Family Medicine | Admitting: Family Medicine

## 2014-03-01 DIAGNOSIS — M810 Age-related osteoporosis without current pathological fracture: Secondary | ICD-10-CM | POA: Diagnosis not present

## 2014-03-01 DIAGNOSIS — M859 Disorder of bone density and structure, unspecified: Secondary | ICD-10-CM | POA: Diagnosis not present

## 2014-03-08 ENCOUNTER — Other Ambulatory Visit: Payer: Self-pay | Admitting: *Deleted

## 2014-03-08 ENCOUNTER — Telehealth: Payer: Self-pay | Admitting: *Deleted

## 2014-03-08 MED ORDER — HYDROCODONE-ACETAMINOPHEN 10-325 MG PO TABS: ORAL_TABLET | ORAL | Status: DC

## 2014-03-08 NOTE — Telephone Encounter (Signed)
Pt seen 10/13. Has appt on 11/11 for physical with Hoyle Sauer. Pt states she does not have enough hydrcodone to last til Nov 11th. Script was printed to be filled 10/31 and 04/12/14. Pt states she does not have these scripts. i called [facility] [place] and they said they do not have the scripts. Please advise.

## 2014-03-08 NOTE — Telephone Encounter (Signed)
Ok letds one mo

## 2014-03-08 NOTE — Telephone Encounter (Signed)
Discussed with patient's husband. Script ready for pickup

## 2014-03-24 ENCOUNTER — Ambulatory Visit (INDEPENDENT_AMBULATORY_CARE_PROVIDER_SITE_OTHER): Payer: Medicare Other | Admitting: Nurse Practitioner

## 2014-03-24 ENCOUNTER — Encounter: Payer: Self-pay | Admitting: Nurse Practitioner

## 2014-03-24 VITALS — BP 124/82 | Ht 62.0 in | Wt 183.0 lb

## 2014-03-24 DIAGNOSIS — Z23 Encounter for immunization: Secondary | ICD-10-CM | POA: Diagnosis not present

## 2014-03-24 DIAGNOSIS — Z01419 Encounter for gynecological examination (general) (routine) without abnormal findings: Secondary | ICD-10-CM

## 2014-03-24 DIAGNOSIS — Z0000 Encounter for general adult medical examination without abnormal findings: Secondary | ICD-10-CM

## 2014-03-24 DIAGNOSIS — Z1231 Encounter for screening mammogram for malignant neoplasm of breast: Secondary | ICD-10-CM | POA: Diagnosis not present

## 2014-03-24 NOTE — Patient Instructions (Signed)
Recommend tetanus shot at local pharmacy Continue vitamin D and calcium

## 2014-03-24 NOTE — Progress Notes (Signed)
Subjective:    Patient ID: [name], female    DOB: [birth year], 73 y.o.   MRN: [id number]  HPI AWV- Annual Wellness Visit  The patient was seen for their annual wellness visit. The patient's past medical history, surgical history, and family history were reviewed. Pertinent vaccines were reviewed ( tetanus, pneumonia, shingles, flu) The patient's medication list was reviewed and updated.  The height and weight were entered. The patient's current BMI is: 33.74  Cognitive screening was completed. Outcome of Mini - Cog: unable to do  Falls within the past 6 months: none  Current tobacco usage: light smoker (All patients who use tobacco were given written and verbal information on quitting)  Recent listing of emergency department/hospitalizations over the past year were reviewed.  current specialist the patient sees on a regular basis: none   Medicare annual wellness visit patient questionnaire was reviewed.  A written screening schedule for the patient for the next 5-10 years was given. Appropriate discussion of followup regarding next visit was discussed.  Presents for her wellness exam. Married, same sexual partner. Gets regular vision exams. Wears top dentures. No mouth sores or lesions. Tries to stay active but limited due to problems associated with her stroke. Defers colonoscopy. Had her bone density in October. Some difficulty with communication.     Review of Systems  Constitutional: Negative for fever, activity change, appetite change and fatigue.  HENT: Negative for dental problem, ear pain, sinus pressure and sore throat.   Respiratory: Negative for cough, chest tightness, shortness of breath and wheezing.   Cardiovascular: Negative for chest pain and leg swelling.  Gastrointestinal: Positive for constipation. Negative for nausea, abdominal pain, diarrhea, blood in stool and abdominal distention.       Constipation controlled with Mira lax.  Genitourinary:  Positive for enuresis. Negative for dysuria, urgency, frequency, vaginal discharge, difficulty urinating, genital sores and pelvic pain.       Wears a pad all the time for urinary incontinence.  Musculoskeletal: Positive for gait problem.  Neurological: Positive for speech difficulty and weakness.       Objective:   Physical Exam  Constitutional: She is oriented to person, place, and time. She appears well-developed. No distress.  HENT:  Right Ear: External ear normal.  Left Ear: External ear normal.  Mouth/Throat: Oropharynx is clear and moist.  Neck: Normal range of motion. Neck supple. No tracheal deviation present. No thyromegaly present.  Cardiovascular: Normal rate, regular rhythm and normal heart sounds.  Exam reveals no gallop.   No murmur heard. Pulmonary/Chest: Effort normal and breath sounds normal.  Abdominal: Soft. She exhibits no distension and no mass. There is no tenderness. There is no rebound and no guarding.  Genitourinary:  Defers pelvic and rectal exams.  Musculoskeletal: She exhibits no edema.  Lymphadenopathy:    She has no cervical adenopathy.  Neurological: She is alert and oriented to person, place, and time.  Skin: Skin is warm and dry. No rash noted.  Psychiatric: She has a normal mood and affect. Her behavior is normal.  breast exam: No masses noted. Axillae no adenopathy.        Assessment & Plan:  Well woman exam - Plan: POC Hemoccult Bld/Stl (3-Cd Home Screen)  Encounter for screening mammogram for breast cancer - Plan: MM Digital Screening  Need for prophylactic vaccination against Streptococcus pneumoniae (pneumococcus) - Plan: Pneumococcal conjugate vaccine 13-valent IM  Encourage activity as tolerated, healthy diet and daily vitamin D/calcium supplementation. Return in about 3  months (around 06/24/2014). Call back sooner if any problems.

## 2014-03-25 ENCOUNTER — Encounter: Payer: Self-pay | Admitting: Nurse Practitioner

## 2014-03-25 ENCOUNTER — Ambulatory Visit: Payer: Medicare Other | Admitting: Family Medicine

## 2014-03-29 ENCOUNTER — Encounter (HOSPITAL_COMMUNITY): Payer: Self-pay

## 2014-03-29 ENCOUNTER — Emergency Department (HOSPITAL_COMMUNITY): Payer: Medicare Other

## 2014-03-29 ENCOUNTER — Ambulatory Visit (HOSPITAL_COMMUNITY): Payer: Medicare Other

## 2014-03-29 ENCOUNTER — Observation Stay (HOSPITAL_COMMUNITY)
Admission: EM | Admit: 2014-03-29 | Discharge: 2014-03-31 | Disposition: A | Discharge status: Home / Self Care | Payer: Medicare Other | Attending: Internal Medicine | Admitting: Internal Medicine

## 2014-03-29 DIAGNOSIS — F32A Depression, unspecified: Secondary | ICD-10-CM | POA: Diagnosis present

## 2014-03-29 DIAGNOSIS — R482 Apraxia: Secondary | ICD-10-CM | POA: Diagnosis not present

## 2014-03-29 DIAGNOSIS — F419 Anxiety disorder, unspecified: Secondary | ICD-10-CM | POA: Diagnosis not present

## 2014-03-29 DIAGNOSIS — I10 Essential (primary) hypertension: Secondary | ICD-10-CM | POA: Diagnosis not present

## 2014-03-29 DIAGNOSIS — I69320 Aphasia following cerebral infarction: Secondary | ICD-10-CM

## 2014-03-29 DIAGNOSIS — E785 Hyperlipidemia, unspecified: Secondary | ICD-10-CM | POA: Insufficient documentation

## 2014-03-29 DIAGNOSIS — M810 Age-related osteoporosis without current pathological fracture: Secondary | ICD-10-CM | POA: Insufficient documentation

## 2014-03-29 DIAGNOSIS — R079 Chest pain, unspecified: Secondary | ICD-10-CM | POA: Diagnosis present

## 2014-03-29 DIAGNOSIS — J449 Chronic obstructive pulmonary disease, unspecified: Secondary | ICD-10-CM | POA: Insufficient documentation

## 2014-03-29 DIAGNOSIS — R61 Generalized hyperhidrosis: Secondary | ICD-10-CM | POA: Insufficient documentation

## 2014-03-29 DIAGNOSIS — Z720 Tobacco use: Secondary | ICD-10-CM | POA: Diagnosis present

## 2014-03-29 DIAGNOSIS — Z79899 Other long term (current) drug therapy: Secondary | ICD-10-CM | POA: Insufficient documentation

## 2014-03-29 DIAGNOSIS — Z8673 Personal history of transient ischemic attack (TIA), and cerebral infarction without residual deficits: Secondary | ICD-10-CM | POA: Diagnosis not present

## 2014-03-29 DIAGNOSIS — IMO0002 Reserved for concepts with insufficient information to code with codable children: Secondary | ICD-10-CM

## 2014-03-29 DIAGNOSIS — K219 Gastro-esophageal reflux disease without esophagitis: Secondary | ICD-10-CM | POA: Diagnosis not present

## 2014-03-29 DIAGNOSIS — R109 Unspecified abdominal pain: Secondary | ICD-10-CM

## 2014-03-29 DIAGNOSIS — R002 Palpitations: Secondary | ICD-10-CM | POA: Diagnosis not present

## 2014-03-29 DIAGNOSIS — R1031 Right lower quadrant pain: Secondary | ICD-10-CM | POA: Diagnosis not present

## 2014-03-29 DIAGNOSIS — E039 Hypothyroidism, unspecified: Secondary | ICD-10-CM | POA: Diagnosis not present

## 2014-03-29 DIAGNOSIS — J9811 Atelectasis: Secondary | ICD-10-CM | POA: Diagnosis not present

## 2014-03-29 DIAGNOSIS — M47816 Spondylosis without myelopathy or radiculopathy, lumbar region: Secondary | ICD-10-CM | POA: Diagnosis not present

## 2014-03-29 DIAGNOSIS — M797 Fibromyalgia: Secondary | ICD-10-CM | POA: Diagnosis not present

## 2014-03-29 DIAGNOSIS — D649 Anemia, unspecified: Secondary | ICD-10-CM | POA: Diagnosis present

## 2014-03-29 DIAGNOSIS — F329 Major depressive disorder, single episode, unspecified: Secondary | ICD-10-CM

## 2014-03-29 DIAGNOSIS — K2970 Gastritis, unspecified, without bleeding: Secondary | ICD-10-CM | POA: Insufficient documentation

## 2014-03-29 DIAGNOSIS — R0789 Other chest pain: Secondary | ICD-10-CM | POA: Diagnosis not present

## 2014-03-29 DIAGNOSIS — R140 Abdominal distension (gaseous): Secondary | ICD-10-CM | POA: Diagnosis not present

## 2014-03-29 LAB — URINALYSIS, ROUTINE W REFLEX MICROSCOPIC
Bilirubin Urine: NEGATIVE
Glucose, UA: NEGATIVE mg/dL
Hgb urine dipstick: NEGATIVE
Ketones, ur: NEGATIVE mg/dL
LEUKOCYTES UA: NEGATIVE
NITRITE: NEGATIVE
PH: 5.5 (ref 5.0–8.0)
Protein, ur: NEGATIVE mg/dL
SPECIFIC GRAVITY, URINE: 1.015 (ref 1.005–1.030)
UROBILINOGEN UA: 0.2 mg/dL (ref 0.0–1.0)

## 2014-03-29 LAB — CBC WITH DIFFERENTIAL/PLATELET
BASOS PCT: 1 % (ref 0–1)
Basophils Absolute: 0.1 10*3/uL (ref 0.0–0.1)
Eosinophils Absolute: 0.2 10*3/uL (ref 0.0–0.7)
Eosinophils Relative: 2 % (ref 0–5)
HCT: 33.5 % — ABNORMAL LOW (ref 36.0–46.0)
HEMOGLOBIN: 10.6 g/dL — AB (ref 12.0–15.0)
Lymphocytes Relative: 18 % (ref 12–46)
Lymphs Abs: 1.9 10*3/uL (ref 0.7–4.0)
MCH: 26.6 pg (ref 26.0–34.0)
MCHC: 31.6 g/dL (ref 30.0–36.0)
MCV: 84.2 fL (ref 78.0–100.0)
MONOS PCT: 8 % (ref 3–12)
Monocytes Absolute: 0.8 10*3/uL (ref 0.1–1.0)
NEUTROS ABS: 7.5 10*3/uL (ref 1.7–7.7)
NEUTROS PCT: 71 % (ref 43–77)
Platelets: 314 10*3/uL (ref 150–400)
RBC: 3.98 MIL/uL (ref 3.87–5.11)
RDW: 18.8 % — ABNORMAL HIGH (ref 11.5–15.5)
WBC: 10.5 10*3/uL (ref 4.0–10.5)

## 2014-03-29 LAB — TROPONIN I: Troponin I: <0.3 ng/mL (ref <0.30)

## 2014-03-29 LAB — COMPREHENSIVE METABOLIC PANEL
ALBUMIN: 3.4 g/dL — AB (ref 3.5–5.2)
ALT: 15 U/L (ref 0–35)
AST: 32 U/L (ref 0–37)
Alkaline Phosphatase: 71 U/L (ref 39–117)
Anion gap: 13 (ref 5–15)
BUN: 13 mg/dL (ref 6–23)
CO2: 24 mEq/L (ref 19–32)
Calcium: 9 mg/dL (ref 8.4–10.5)
Chloride: 102 mEq/L (ref 96–112)
Creatinine, Ser: 0.99 mg/dL (ref 0.50–1.10)
GFR calc Af Amer: 64 mL/min — ABNORMAL LOW (ref >90)
GFR calc non Af Amer: 55 mL/min — ABNORMAL LOW (ref >90)
Glucose, Bld: 86 mg/dL (ref 70–99)
POTASSIUM: 4.8 meq/L (ref 3.7–5.3)
Sodium: 139 mEq/L (ref 137–147)
Total Bilirubin: <0.2 mg/dL — ABNORMAL LOW (ref 0.3–1.2)
Total Protein: 6.2 g/dL (ref 6.0–8.3)

## 2014-03-29 LAB — LIPASE, BLOOD: LIPASE: 24 U/L (ref 11–59)

## 2014-03-29 MED ORDER — SODIUM CHLORIDE 0.45 % IV SOLN
INTRAVENOUS | Status: DC
  Administered 2014-03-30: 17:00:00 via INTRAVENOUS

## 2014-03-29 MED ORDER — SENNA 8.6 MG PO TABS
1.0000 | ORAL_TABLET | Freq: Two times a day (BID) | ORAL | Status: DC
  Filled 2014-03-29 (×4): qty 1

## 2014-03-29 MED ORDER — HEPARIN SODIUM (PORCINE) 5000 UNIT/ML IJ SOLN
5000.0000 [IU] | Freq: Three times a day (TID) | INTRAMUSCULAR | Status: DC
  Administered 2014-03-30 – 2014-03-31 (×4): 5000 [IU] via SUBCUTANEOUS
  Filled 2014-03-29 (×5): qty 1

## 2014-03-29 MED ORDER — ALPRAZOLAM 1 MG PO TABS
1.0000 mg | ORAL_TABLET | Freq: Two times a day (BID) | ORAL | Status: DC | PRN
  Administered 2014-03-30 (×2): 1 mg via ORAL
  Filled 2014-03-29 (×2): qty 1

## 2014-03-29 MED ORDER — GI COCKTAIL ~~LOC~~: 30.0000 mL | Freq: Four times a day (QID) | ORAL | Status: DC | PRN

## 2014-03-29 MED ORDER — HYDROXYZINE HCL 25 MG PO TABS: 25.0000 mg | ORAL_TABLET | Freq: Three times a day (TID) | ORAL | Status: DC | PRN

## 2014-03-29 MED ORDER — LISINOPRIL 10 MG PO TABS
10.0000 mg | ORAL_TABLET | Freq: Every day | ORAL | Status: DC
  Administered 2014-03-30: 10 mg via ORAL
  Filled 2014-03-29: qty 1

## 2014-03-29 MED ORDER — ACETAMINOPHEN 325 MG PO TABS: 650.0000 mg | ORAL_TABLET | ORAL | Status: DC | PRN

## 2014-03-29 MED ORDER — NICOTINE 21 MG/24HR TD PT24
21.0000 mg | MEDICATED_PATCH | Freq: Every day | TRANSDERMAL | Status: DC
  Administered 2014-03-30: 21 mg via TRANSDERMAL
  Filled 2014-03-29 (×2): qty 1

## 2014-03-29 MED ORDER — PANTOPRAZOLE SODIUM 40 MG PO TBEC: 40.0000 mg | DELAYED_RELEASE_TABLET | Freq: Every day | ORAL | Status: DC

## 2014-03-29 MED ORDER — PANTOPRAZOLE SODIUM 40 MG PO TBEC
40.0000 mg | DELAYED_RELEASE_TABLET | Freq: Every day | ORAL | Status: DC
  Administered 2014-03-30 – 2014-03-31 (×2): 40 mg via ORAL
  Filled 2014-03-29 (×2): qty 1

## 2014-03-29 MED ORDER — HYDROCODONE-ACETAMINOPHEN 10-325 MG PO TABS
1.0000 | ORAL_TABLET | Freq: Four times a day (QID) | ORAL | Status: DC | PRN
  Administered 2014-03-30 – 2014-03-31 (×4): 1 via ORAL
  Filled 2014-03-29 (×4): qty 1

## 2014-03-29 MED ORDER — HEPARIN SODIUM (PORCINE) 5000 UNIT/ML IJ SOLN: 5000.0000 [IU] | Freq: Three times a day (TID) | INTRAMUSCULAR | Status: DC

## 2014-03-29 MED ORDER — PAROXETINE HCL 20 MG PO TABS
40.0000 mg | ORAL_TABLET | Freq: Every day | ORAL | Status: DC
  Administered 2014-03-30 – 2014-03-31 (×2): 40 mg via ORAL
  Filled 2014-03-29 (×2): qty 2

## 2014-03-29 MED ORDER — LEVOTHYROXINE SODIUM 112 MCG PO TABS
112.0000 ug | ORAL_TABLET | Freq: Every day | ORAL | Status: DC
  Administered 2014-03-30 – 2014-03-31 (×2): 112 ug via ORAL
  Filled 2014-03-29 (×2): qty 1

## 2014-03-29 MED ORDER — ONDANSETRON HCL 4 MG PO TABS: 4.0000 mg | ORAL_TABLET | Freq: Four times a day (QID) | ORAL | Status: DC | PRN

## 2014-03-29 MED ORDER — ONDANSETRON HCL 4 MG/2ML IJ SOLN: 4.0000 mg | Freq: Four times a day (QID) | INTRAMUSCULAR | Status: DC | PRN

## 2014-03-29 MED ORDER — POLYETHYLENE GLYCOL 3350 17 G PO PACK: 17.0000 g | PACK | Freq: Every day | ORAL | Status: DC | PRN

## 2014-03-29 MED ORDER — PRAVASTATIN SODIUM 10 MG PO TABS
20.0000 mg | ORAL_TABLET | Freq: Every day | ORAL | Status: DC
  Administered 2014-03-30: 20 mg via ORAL
  Filled 2014-03-29: qty 2
  Filled 2014-03-29: qty 1

## 2014-03-29 NOTE — ED Notes (Signed)
Complain of discomfort in center of chest that started this morning

## 2014-03-29 NOTE — ED Notes (Signed)
Pt states she took 324 mg of asa prior to arrival

## 2014-03-29 NOTE — ED Provider Notes (Signed)
CSN: [id number]     Arrival date & time 03/29/14  1424 History   First MD Initiated Contact with Patient 03/29/14 1442     Chief Complaint  Patient presents with  . Chest Pain     (Consider location/radiation/quality/duration/timing/severity/associated sxs/prior Treatment) Patient is a 73 y.o. female presenting with chest pain. The history is provided by the patient.  Chest Pain Associated symptoms: abdominal pain and diaphoresis   Associated symptoms: no back pain, no headache, no nausea, no numbness, no shortness of breath, not vomiting and no weakness   patient presents with mid chest pain. It is dull. She states she's been on and off since yesterday. She states that overnight she had to get up to go to the bathroom and had the feeling she had no diarrhea. She states she was sweaty at that time. She states is a for close felt to type. No fevers. No nausea. Occasional mild shortness of breath. No dysuria. Occasional cough. No production. No blood in stool.  Past Medical History  Diagnosis Date  . Anxiety   . Hypothyroidism   . Hypertension   . Lumbar spondylosis 01/23/2011  . COPD (chronic obstructive pulmonary disease)     MIld  . Hyperlipidemia   . Fibromyalgia   . Gastritis   . Osteoporosis   . Stroke   . NFAOZHYQ(657.8)    Past Surgical History  Procedure Laterality Date  . Cholecystectomy    . Gastric bypass    . Esophagogastroduodenoscopy    . Knee surgery Left    Family History  Problem Relation Age of Onset  . Heart attack Father    History  Substance Use Topics  . Smoking status: Light Tobacco Smoker -- 2.00 packs/day for 30 years    Types: Cigarettes  . Smokeless tobacco: Not on file     Comment: Smoking Now Every Once & Awhile Per Patient  . Alcohol Use: No   OB History    No data available     Review of Systems  Constitutional: Positive for diaphoresis. Negative for activity change and appetite change.  Eyes: Negative for pain.  Respiratory:  Negative for chest tightness and shortness of breath.   Cardiovascular: Positive for chest pain. Negative for leg swelling.  Gastrointestinal: Positive for abdominal pain. Negative for nausea, vomiting, diarrhea and blood in stool.  Genitourinary: Negative for flank pain.  Musculoskeletal: Negative for back pain and neck stiffness.  Skin: Negative for rash.  Neurological: Negative for weakness, numbness and headaches.  Psychiatric/Behavioral: Negative for behavioral problems.      Allergies  Levaquin and Macrobid  Home Medications   Prior to Admission medications   Medication Sig Start Date End Date Taking? Authorizing Provider  alendronate (FOSAMAX) 70 MG tablet TAKE 1 TABLET BY MOUTH WEEKLY Patient taking differently: Take 70 mg by mouth once a week. Takes on Mondays. 02/08/14  Yes Mikey Kirschner, MD  ALPRAZolam Duanne Moron) 1 MG tablet TAKE 1 TABLET BY MOUTH TWICE A DAY 01/15/14  Yes Mikey Kirschner, MD  antipyrine-benzocaine Toniann Fail) otic solution Place 3-4 drops into both ears every 2 (two) hours as needed for ear pain.  01/25/14  Yes Historical Provider, MD  HYDROcodone-acetaminophen ([place]) 10-325 MG per tablet TAKE 1 TABLET FOUR TIMES DAILY AS NEEDED.MUST LAST 30 DAYS Patient taking differently: Take 1 tablet by mouth 4 (four) times daily as needed for moderate pain. MUST LAST 30 DAYS. 03/08/14  Yes Mikey Kirschner, MD  hydrOXYzine (ATARAX/VISTARIL) 25 MG tablet TAKE 1 TABLET  BY MOUTH EVERY 6 HOURS AS NEEDED FOR ITCHING 01/12/14  Yes Kathyrn Drown, MD  levothyroxine (SYNTHROID, LEVOTHROID) 112 MCG tablet TAKE 1 TABLET BY MOUTH EVERY DAY Patient taking differently: Take 112 mcg by mouth daily before breakfast.  02/08/14  Yes Mikey Kirschner, MD  lisinopril (PRINIVIL,ZESTRIL) 10 MG tablet Take 10 mg by mouth daily.   Yes Historical Provider, MD  Misc Natural Products (OSTEO BI-FLEX ADV DOUBLE ST PO) Take 1 tablet by mouth daily.   Yes Historical Provider, MD  Multiple Vitamin  (MULTIVITAMIN WITH MINERALS) TABS Take 1 tablet by mouth daily.   Yes Historical Provider, MD  nortriptyline (PAMELOR) 50 MG capsule TAKE ONE CAPSULE AT BEDTIME Patient taking differently: Take 50 mg by mouth at bedtime.  02/08/14  Yes Mikey Kirschner, MD  pantoprazole (PROTONIX) 40 MG tablet TAKE 1 TABLET BY MOUTH EVERY DAY **STOP RANITIDINE Patient taking differently: Take 40 mg by mouth daily.  02/08/14  Yes Mikey Kirschner, MD  PARoxetine (PAXIL) 40 MG tablet TAKE 1 TABLET BY MOUTH EVERY DAY AS DIRECTED Patient taking differently: Take 40 mg by mouth daily.  02/08/14  Yes Mikey Kirschner, MD  polyethylene glycol powder (MIRALAX) powder Take 17 g by mouth daily.     Yes Historical Provider, MD  pravastatin (PRAVACHOL) 20 MG tablet TAKE 1 TABLET EVERY DAY Patient taking differently: Take 20 mg by mouth daily.  02/08/14  Yes Mikey Kirschner, MD  lisinopril (PRINIVIL,ZESTRIL) 10 MG tablet TAKE 1 & 1/2 TABLETS BY MOUTH EVERY DAY FOR BLOOD PRESSURE Patient not taking: Reported on 03/29/2014 06/13/13   Mikey Kirschner, MD   BP 187/80 mmHg  Pulse 87  Temp(Src) 98.9 F (37.2 C) (Oral)  Resp 18  Ht 5\' 2"  (1.575 m)  Wt 183 lb (83.008 kg)  BMI 33.46 kg/m2  SpO2 100% Physical Exam  Constitutional: She is oriented to person, place, and time. She appears well-developed and well-nourished.  HENT:  Head: Normocephalic and atraumatic.  Eyes: No scleral icterus.  Neck: Normal range of motion. Neck supple.  Cardiovascular: Normal rate, regular rhythm and normal heart sounds.   No murmur heard. Pulmonary/Chest: Effort normal and breath sounds normal. No respiratory distress. She has no wheezes. She has no rales.  Abdominal: Soft. She exhibits no distension. There is tenderness. There is no rebound and no guarding.  Patient had right lower quadrant tenderness with initial palpation, however with repalpation there was no tenderness.  Neurological: She is alert and oriented to person, place, and time.  No cranial nerve deficit.  Skin: Skin is warm and dry.  Psychiatric: She has a normal mood and affect. Her speech is normal.  Nursing note and vitals reviewed.   ED Course  Procedures (including critical care time) Labs Review Labs Reviewed  CBC WITH DIFFERENTIAL - Abnormal; Notable for the following:    Hemoglobin 10.6 (*)    HCT 33.5 (*)    RDW 18.8 (*)    All other components within normal limits  COMPREHENSIVE METABOLIC PANEL - Abnormal; Notable for the following:    Albumin 3.4 (*)    Total Bilirubin <0.2 (*)    GFR calc non Af Amer 55 (*)    GFR calc Af Amer 64 (*)    All other components within normal limits  URINALYSIS, ROUTINE W REFLEX MICROSCOPIC  TROPONIN I  LIPASE, BLOOD  TROPONIN I  TROPONIN I    Imaging Review Dg Abd Acute W/chest  03/29/2014   CLINICAL DATA:  Central chest discomfort today with bloating. Initial encounter.  EXAM: ACUTE ABDOMEN SERIES (ABDOMEN 2 VIEW & CHEST 1 VIEW)  COMPARISON:  Abdominal CT 04/28/2012. Acute abdominal series 01/20/2011.  FINDINGS: There are significantly lower lung volumes with associated bibasilar atelectasis. No focal airspace disease or significant pleural effusion is seen. The heart size and mediastinal contours are stable.  There are multiple surgical clips in the upper abdomen and pelvis. Multiple pelvic calcifications are grossly stable. The bowel gas pattern is normal. There is no free intraperitoneal air. There are stable degenerative changes of the lumbar spine and both hips.  IMPRESSION: 1. Suboptimal inspiration with resulting bibasilar atelectasis. 2. No acute abdominal findings.   Electronically Signed   By: Camie Patience M.D.   On: 03/29/2014 15:58     EKG Interpretation None      MDM   Final diagnoses:  Chest pain  Abdominal pain    Patient with chest pain. EKG reassuring. Has numerous risk factors. Will admit to internal medicine for rule out. Some lateral tenderness over her pelvis anteriorly.will not  further evaluate at this time.    Jasper Riling. Alvino Chapel, MD 03/30/14 [phone]

## 2014-03-29 NOTE — H&P (Signed)
[facility] History and Physical  [name] QJJ:[id number] DOB: 1941-05-06 DOA: 03/29/2014  Referring physician: Dr. Alvino Chapel - [facility] PCP: Rubbie Battiest, MD   Chief Complaint: Chest pain  HPI: [name] is a 73 y.o. female  CHest pain. Started today at 11:30 while filing papers. Mid chest and non-radiating. Associated w/ diaphoresis, mild SOB and palpitations. EMS was called. Pt took 2 ASA at home. CP resolved upon arrival at ED. No record of nitro beign given.  Continues to smoke 1ppd Also w/ vague intermittent RLQ/chest pain that is ttp. H/o falls in past. Not worsened w/ deep breathing just palpation. Pt w/ h/o constipation . Last BM today at 5:30 am.  Of note pt w/ Apraxia and aphasia after previous stroke. Assisted by husband in ED.      Review of Systems:  Constitutional:   Fevers, chills, fatigue.  HEENT:  Difficulty swallowing, Cardio-vascular:  No orthopnea or LE swelling  GI:  No nausea, vomiting, diarrhea, change in bowel habits, loss of appetite  Resp:  No . No excess mucus, no productive cough, No non-productive cough, No coughing up of blood.No change in color of mucus.No wheezing.No chest wall deformity  Skin:  no rash or lesions.  GU:  no dysuria, change in color of urine, no urgency or frequency. No flank pain.  Musculoskeletal:  No joint pain or swelling. No decreased range of motion. No back pain.  Psych:  No change in mood or affect. No depression or anxiety. No memory loss.   Past Medical History  Diagnosis Date  . Anxiety   . Hypothyroidism   . Hypertension   . Lumbar spondylosis 01/23/2011  . COPD (chronic obstructive pulmonary disease)     MIld  . Hyperlipidemia   . Fibromyalgia   . Gastritis   . Osteoporosis   . Stroke   . GYJEHUDJ(497.0)    Past Surgical History  Procedure Laterality Date  . Cholecystectomy    . Gastric bypass    . Esophagogastroduodenoscopy    . Knee surgery Left    Social History:  reports  that she has been smoking Cigarettes.  She has a 60 pack-year smoking history. She does not have any smokeless tobacco history on file. She reports that she does not drink alcohol or use illicit drugs.  Allergies  Allergen Reactions  . Levaquin [Levofloxacin In D5w] Swelling    To mouth  . Macrobid [Nitrofurantoin Macrocrystal]     Patient can't remember reaction.    Family History  Problem Relation Age of Onset  . Heart attack Father      Prior to Admission medications   Medication Sig Start Date End Date Taking? Authorizing Provider  alendronate (FOSAMAX) 70 MG tablet TAKE 1 TABLET BY MOUTH WEEKLY Patient taking differently: Take 70 mg by mouth once a week. Takes on Mondays. 02/08/14  Yes Mikey Kirschner, MD  ALPRAZolam Duanne Moron) 1 MG tablet TAKE 1 TABLET BY MOUTH TWICE A DAY 01/15/14  Yes Mikey Kirschner, MD  antipyrine-benzocaine Toniann Fail) otic solution Place 3-4 drops into both ears every 2 (two) hours as needed for ear pain.  01/25/14  Yes Historical Provider, MD  HYDROcodone-acetaminophen ([place]) 10-325 MG per tablet TAKE 1 TABLET FOUR TIMES DAILY AS NEEDED.MUST LAST 30 DAYS Patient taking differently: Take 1 tablet by mouth 4 (four) times daily as needed for moderate pain. MUST LAST 30 DAYS. 03/08/14  Yes Mikey Kirschner, MD  hydrOXYzine (ATARAX/VISTARIL) 25 MG tablet TAKE 1 TABLET BY MOUTH EVERY  6 HOURS AS NEEDED FOR ITCHING 01/12/14  Yes Kathyrn Drown, MD  levothyroxine (SYNTHROID, LEVOTHROID) 112 MCG tablet TAKE 1 TABLET BY MOUTH EVERY DAY Patient taking differently: Take 112 mcg by mouth daily before breakfast.  02/08/14  Yes Mikey Kirschner, MD  lisinopril (PRINIVIL,ZESTRIL) 10 MG tablet Take 10 mg by mouth daily.   Yes Historical Provider, MD  Misc Natural Products (OSTEO BI-FLEX ADV DOUBLE ST PO) Take 1 tablet by mouth daily.   Yes Historical Provider, MD  Multiple Vitamin (MULTIVITAMIN WITH MINERALS) TABS Take 1 tablet by mouth daily.   Yes Historical Provider, MD    nortriptyline (PAMELOR) 50 MG capsule TAKE ONE CAPSULE AT BEDTIME Patient taking differently: Take 50 mg by mouth at bedtime.  02/08/14  Yes Mikey Kirschner, MD  pantoprazole (PROTONIX) 40 MG tablet TAKE 1 TABLET BY MOUTH EVERY DAY **STOP RANITIDINE Patient taking differently: Take 40 mg by mouth daily.  02/08/14  Yes Mikey Kirschner, MD  PARoxetine (PAXIL) 40 MG tablet TAKE 1 TABLET BY MOUTH EVERY DAY AS DIRECTED Patient taking differently: Take 40 mg by mouth daily.  02/08/14  Yes Mikey Kirschner, MD  polyethylene glycol powder (MIRALAX) powder Take 17 g by mouth daily.     Yes Historical Provider, MD  pravastatin (PRAVACHOL) 20 MG tablet TAKE 1 TABLET EVERY DAY Patient taking differently: Take 20 mg by mouth daily.  02/08/14  Yes Mikey Kirschner, MD  lisinopril (PRINIVIL,ZESTRIL) 10 MG tablet TAKE 1 & 1/2 TABLETS BY MOUTH EVERY DAY FOR BLOOD PRESSURE Patient not taking: Reported on 03/29/2014 06/13/13   Mikey Kirschner, MD   Physical Exam: Filed Vitals:   03/29/14 1429 03/29/14 1530  BP: 133/61 115/64  Pulse: 77 70  Temp: 98.6 F (37 C)   TempSrc: Oral   Resp: 18 16  SpO2: 100% 97%    Wt Readings from Last 3 Encounters:  03/24/14 83.008 kg (183 lb)  02/23/14 83.008 kg (183 lb)  12/03/13 85.73 kg (189 lb)    General: Appears calm and comfortable Eyes: PERRL, normal lids, irises & conjunctiva ENT:  Difficutly hearing, lips & tongue Neck:  no LAD, masses or thyromegaly Cardiovascular: faint heart sounds, RRR, no m/r/g. No LE edema. Telemetry:  SR, no arrhythmias  Respiratory: CTA bilaterally, no w/r/r. Normal respiratory effort. Abdomen:  soft, ntnd Skin:  no rash or induration seen on limited exam Musculoskeletal: superficial intermittent mild ttp along the 10th R rib.  grossly normal tone BUE/BLE Psychiatric:  grossly normal mood and affect, speech fluent and appropriate Neurologic: Aphasic and apraxia present. No other focal deficits           Labs on Admission:   Basic Metabolic Panel:  Recent Labs Lab 03/29/14 1459  NA 139  K 4.8  CL 102  CO2 24  GLUCOSE 86  BUN 13  CREATININE 0.99  CALCIUM 9.0   Liver Function Tests:  Recent Labs Lab 03/29/14 1459  AST 32  ALT 15  ALKPHOS 71  BILITOT <0.2*  PROT 6.2  ALBUMIN 3.4*    Recent Labs Lab 03/29/14 1459  LIPASE 24   No results for input(s): AMMONIA in the last 168 hours. CBC:  Recent Labs Lab 03/29/14 1459  WBC 10.5  NEUTROABS 7.5  HGB 10.6*  HCT 33.5*  MCV 84.2  PLT 314   Cardiac Enzymes:  Recent Labs Lab 03/29/14 1459  TROPONINI <0.30    BNP (last 3 results) No results for input(s): PROBNP in the last  8760 hours. CBG: No results for input(s): GLUCAP in the last 168 hours.  Radiological Exams on Admission: Dg Abd Acute W/chest  03/29/2014   CLINICAL DATA:  Central chest discomfort today with bloating. Initial encounter.  EXAM: ACUTE ABDOMEN SERIES (ABDOMEN 2 VIEW & CHEST 1 VIEW)  COMPARISON:  Abdominal CT 04/28/2012. Acute abdominal series 01/20/2011.  FINDINGS: There are significantly lower lung volumes with associated bibasilar atelectasis. No focal airspace disease or significant pleural effusion is seen. The heart size and mediastinal contours are stable.  There are multiple surgical clips in the upper abdomen and pelvis. Multiple pelvic calcifications are grossly stable. The bowel gas pattern is normal. There is no free intraperitoneal air. There are stable degenerative changes of the lumbar spine and both hips.  IMPRESSION: 1. Suboptimal inspiration with resulting bibasilar atelectasis. 2. No acute abdominal findings.   Electronically Signed   By: Camie Patience M.D.   On: 03/29/2014 15:58    EKG: Independently reviewed. NSR, RBBB, LAFB . No change from previous. No sign of ACS  Assessment/Plan Principal Problem:   Chest pain Active Problems:   Anxiety   Fibromyalgia   GERD (gastroesophageal reflux disease)   HTN (hypertension), malignant    Hypothyroidism   Aphasia due to stroke   Apraxia due to stroke   Tobacco abuse   Depression  Chest pain: suspicious of ACS (mid chest and associated w/ diaphoresis, palp, and SOB, relieved w/ ASA). EKG unchanged from previous as noted above - no evidence of MI. Troponin neg. While concerning for cardiac may also be related to GERD, or MSK, or thyroid dysfunction. Lipid panel earlier this year nml. - Admit for CP r/o for Obs to Tele - Cycle troponins - continue ASA and statin - EKG in am - GI cocktail  HTN: normotensive on admission - continue lisinopril  GERD:  - continue protonix  Stroke: CVA of L MCA resulting in aphasia and apraxia. No new deficits - monitor  Hypothyroid:  - continue synthroid  Tobacco: 1ppd - nicotine  Depression/Anxiety: at baseline - continue xanax PRN - continue paxil (pt also w/ nortriptyline on chart but endorses paxil)  Code Status: FULL DVT Prophylaxis: Hep [place] TID Family Communication: Husband present Disposition Plan: Pending improvement  Time spent: >70 min  [place] [email or web address] Password [email or web address]

## 2014-03-30 DIAGNOSIS — I359 Nonrheumatic aortic valve disorder, unspecified: Secondary | ICD-10-CM

## 2014-03-30 DIAGNOSIS — I69320 Aphasia following cerebral infarction: Secondary | ICD-10-CM | POA: Diagnosis not present

## 2014-03-30 DIAGNOSIS — R1013 Epigastric pain: Secondary | ICD-10-CM | POA: Diagnosis not present

## 2014-03-30 DIAGNOSIS — I10 Essential (primary) hypertension: Secondary | ICD-10-CM | POA: Diagnosis not present

## 2014-03-30 DIAGNOSIS — R079 Chest pain, unspecified: Secondary | ICD-10-CM | POA: Diagnosis not present

## 2014-03-30 DIAGNOSIS — K219 Gastro-esophageal reflux disease without esophagitis: Secondary | ICD-10-CM | POA: Diagnosis not present

## 2014-03-30 DIAGNOSIS — E038 Other specified hypothyroidism: Secondary | ICD-10-CM | POA: Diagnosis not present

## 2014-03-30 DIAGNOSIS — E785 Hyperlipidemia, unspecified: Secondary | ICD-10-CM

## 2014-03-30 DIAGNOSIS — Z720 Tobacco use: Secondary | ICD-10-CM | POA: Diagnosis not present

## 2014-03-30 DIAGNOSIS — R1031 Right lower quadrant pain: Secondary | ICD-10-CM | POA: Diagnosis not present

## 2014-03-30 DIAGNOSIS — R002 Palpitations: Secondary | ICD-10-CM | POA: Diagnosis not present

## 2014-03-30 DIAGNOSIS — D649 Anemia, unspecified: Secondary | ICD-10-CM | POA: Diagnosis not present

## 2014-03-30 LAB — BASIC METABOLIC PANEL
ANION GAP: 12 (ref 5–15)
BUN: 9 mg/dL (ref 6–23)
CHLORIDE: 104 meq/L (ref 96–112)
CO2: 22 mEq/L (ref 19–32)
Calcium: 8.6 mg/dL (ref 8.4–10.5)
Creatinine, Ser: 0.68 mg/dL (ref 0.50–1.10)
GFR calc Af Amer: >90 mL/min (ref >90)
GFR calc non Af Amer: 85 mL/min — ABNORMAL LOW (ref >90)
Glucose, Bld: 93 mg/dL (ref 70–99)
Potassium: 4.2 mEq/L (ref 3.7–5.3)
SODIUM: 138 meq/L (ref 137–147)

## 2014-03-30 LAB — VITAMIN B12: Vitamin B-12: 689 pg/mL (ref 211–911)

## 2014-03-30 LAB — CBC
HEMATOCRIT: 30.4 % — AB (ref 36.0–46.0)
HEMOGLOBIN: 9.8 g/dL — AB (ref 12.0–15.0)
MCH: 26.7 pg (ref 26.0–34.0)
MCHC: 32.2 g/dL (ref 30.0–36.0)
MCV: 82.8 fL (ref 78.0–100.0)
Platelets: 288 10*3/uL (ref 150–400)
RBC: 3.67 MIL/uL — ABNORMAL LOW (ref 3.87–5.11)
RDW: 18.4 % — ABNORMAL HIGH (ref 11.5–15.5)
WBC: 7.3 10*3/uL (ref 4.0–10.5)

## 2014-03-30 LAB — LIPID PANEL
CHOL/HDL RATIO: 2.1 ratio
CHOLESTEROL: 149 mg/dL (ref 0–200)
HDL: 72 mg/dL (ref >39)
LDL Cholesterol: 60 mg/dL (ref 0–99)
Triglycerides: 84 mg/dL (ref <150)
VLDL: 17 mg/dL (ref 0–40)

## 2014-03-30 LAB — TSH: TSH: 0.698 u[IU]/mL (ref 0.350–4.500)

## 2014-03-30 LAB — IRON AND TIBC
Iron: 39 ug/dL — ABNORMAL LOW (ref 42–135)
Saturation Ratios: 11 % — ABNORMAL LOW (ref 20–55)
TIBC: 354 ug/dL (ref 250–470)
UIBC: 315 ug/dL (ref 125–400)

## 2014-03-30 MED ORDER — LISINOPRIL 10 MG PO TABS
20.0000 mg | ORAL_TABLET | Freq: Every day | ORAL | Status: DC
  Administered 2014-03-31: 20 mg via ORAL
  Filled 2014-03-30: qty 2

## 2014-03-30 MED ORDER — ASPIRIN 81 MG PO CHEW
81.0000 mg | CHEWABLE_TABLET | Freq: Every day | ORAL | Status: DC
  Administered 2014-03-30 – 2014-03-31 (×2): 81 mg via ORAL
  Filled 2014-03-30 (×2): qty 1

## 2014-03-30 MED ORDER — HYDROXYZINE HCL 25 MG PO TABS
25.0000 mg | ORAL_TABLET | Freq: Three times a day (TID) | ORAL | Status: DC | PRN
  Administered 2014-03-31: 25 mg via ORAL
  Filled 2014-03-30: qty 1

## 2014-03-30 MED ORDER — LISINOPRIL 10 MG PO TABS
10.0000 mg | ORAL_TABLET | Freq: Once | ORAL | Status: AC
  Administered 2014-03-30: 10 mg via ORAL
  Filled 2014-03-30: qty 1

## 2014-03-30 NOTE — Progress Notes (Signed)
[facility] PROGRESS NOTE  [name] GQB:[id number] DOB: November 19, 1940 DOA: 03/29/2014 PCP: Rubbie Battiest, MD    Code Status: full code Family Communication: discussed with patient and husband. Disposition Plan: discharged home in clinically appropriate   Consultants:  cardiology  Procedures:  2-D echocardiogram pending  Antibiotics:  none  HPI/Subjective: Currently, the patient has no complaints of chest pain. She reports intermittent chest pressure with shortness of breath, palpitations, and diaphoresis yesterday at home.  Objective: Filed Vitals:   03/30/14 0650  BP: 116/60  Pulse: 78  Temp: 98.3 F (36.8 C)  Resp: 18  oxygen saturation 92% on room air.  Intake/Output Summary (Last 24 hours) at 03/30/14 1112 Last data filed at 03/30/14 0900  Gross per 24 hour  Intake    240 ml  Output      0 ml  Net    240 ml   Filed Weights   03/29/14 1905  Weight: 83.008 kg (183 lb)    Exam:   General:  Pleasant alert elderly woman sitting up in bed, in no acute distress.  Cardiovascular: S1, S2, with a 1 to 2/6 systolic murmur. No ectopy.  Respiratory: decreased breath sounds in the bases, occasional mid low wheezes; breathing nonlabored.  Abdomen: positive bowel sounds, soft, nontender, nondistended.  Musculoskeletal: no acute hot red joints. No pedal edema.  Neurologic: She is alert and oriented 2. Her speech appears to be clear and without significant aphasia. Bilateral handgrip 5-on over 5on the right and 5 over 5 on the left. Scant right facial droop; otherwise cranial nerves are intact.  Data Reviewed: Basic Metabolic Panel:  Recent Labs Lab 03/29/14 1459 03/30/14 0719  NA 139 138  K 4.8 4.2  CL 102 104  CO2 24 22  GLUCOSE 86 93  BUN 13 9  CREATININE 0.99 0.68  CALCIUM 9.0 8.6   Liver Function Tests:  Recent Labs Lab 03/29/14 1459  AST 32  ALT 15  ALKPHOS 71  BILITOT <0.2*  PROT 6.2  ALBUMIN 3.4*    Recent Labs Lab  03/29/14 1459  LIPASE 24   No results for input(s): AMMONIA in the last 168 hours. CBC:  Recent Labs Lab 03/29/14 1459 03/30/14 0719  WBC 10.5 7.3  NEUTROABS 7.5  --   HGB 10.6* 9.8*  HCT 33.5* 30.4*  MCV 84.2 82.8  PLT 314 288   Cardiac Enzymes:  Recent Labs Lab 03/29/14 1459 03/29/14 2022 03/29/14 2256  TROPONINI <0.30 <0.30 <0.30   BNP (last 3 results) No results for input(s): PROBNP in the last 8760 hours. CBG: No results for input(s): GLUCAP in the last 168 hours.  No results found for this or any previous visit (from the past 240 hour(s)).   Studies: Dg Abd Acute W/chest  03/29/2014   CLINICAL DATA:  Central chest discomfort today with bloating. Initial encounter.  EXAM: ACUTE ABDOMEN SERIES (ABDOMEN 2 VIEW & CHEST 1 VIEW)  COMPARISON:  Abdominal CT 04/28/2012. Acute abdominal series 01/20/2011.  FINDINGS: There are significantly lower lung volumes with associated bibasilar atelectasis. No focal airspace disease or significant pleural effusion is seen. The heart size and mediastinal contours are stable.  There are multiple surgical clips in the upper abdomen and pelvis. Multiple pelvic calcifications are grossly stable. The bowel gas pattern is normal. There is no free intraperitoneal air. There are stable degenerative changes of the lumbar spine and both hips.  IMPRESSION: 1. Suboptimal inspiration with resulting bibasilar atelectasis. 2. No acute abdominal findings.   Electronically  Signed   By: Camie Patience M.D.   On: 03/29/2014 15:58    Scheduled Meds: . heparin  5,000 Units Subcutaneous 3 times per day  . levothyroxine  112 mcg Oral QAC breakfast  . lisinopril  10 mg Oral Daily  . nicotine  21 mg Transdermal Daily  . pantoprazole  40 mg Oral Daily  . PARoxetine  40 mg Oral Daily  . pravastatin  20 mg Oral q1800  . senna  1 tablet Oral BID   Continuous Infusions: . sodium chloride     Assessment and plan:  Principal Problem:   Chest pain Active  Problems:   Anxiety   Fibromyalgia   GERD (gastroesophageal reflux disease)   HTN (hypertension), malignant   Hypothyroidism   Aphasia due to stroke   Apraxia due to stroke   Tobacco abuse   Depression   Anemia   1. Chest pain with associated palpitations, shortness of breath, and diaphoresis. Her symptomatology is somewhat concerning in a patient with known cerebrovascular disease/stroke, hypertension, and tobacco abuse. She is without chest pain or symptoms now. Her troponin I is negative 2. However, will order when necessarysublingual nitroglycerin. Start aspirin 81 mg daily.  Continue subcutaneous heparin for DVT prophylaxis. Continue lisinopril and consider adding beta blocker. Continue statin. Order a 2-D echocardiogram and fasting lipid profile. Order TSH. Consult cardiology for further recommendations and evaluation.  Hypertension. Her blood pressure was elevated in the ED, but is now within normal limits. Will continue lisinopril and consider adding beta blocker if her blood pressure increases.  Hyperlipidemia. Continue statin. Fasting lipid profile ordered.  Tobacco abuse. The patient was strongly advised to stop smoking. Will continue nicotine patch.  History of previous left MCA stroke in 2013. Mild residual right-sided weakness and reported apraxia and ataxia. These deficits appear to be mild. We'll order PT evaluation.  Hypothyroidism. We'll continue Synthroid. Will order a TSH.  Normocytic anemia. We'll continue PPI. Will order iron, TIBC, and ferritin.   Time spent: 35 minutes    [facility] Pager [phone]. If 7PM-7AM, please contact night-coverage at [email or web address], password [facility] 03/30/2014, 11:12 AM  LOS: 1 day

## 2014-03-30 NOTE — Progress Notes (Signed)
Pt refused Heparin and Senokot.  Permission granted by pt to speak to Granddaughter Caro Laroche by phone.  Marland Kitchen

## 2014-03-30 NOTE — Progress Notes (Signed)
UR completed 

## 2014-03-30 NOTE — Consult Note (Signed)
CARDIOLOGY CONSULT NOTE   Patient ID: [name] MRN: [id number] DOB/AGE: June 29, 1940 73 y.o.  Admit Date: 03/29/2014 Referring Physician: PTHCaryn Section MD Primary Physician: Rubbie Battiest, MD Consulting Cardiologist: Kate Sable MD Primary Cardiologist: New Reason for Consultation: Chest Pain  Clinical Summary [name] is a 73 y.o.female with multiple CVRF to include hypertension, hypercholesterolemia,  hx of CVA (2013 Left MCA territory infarct, non-hemorrhagic), residual aphasia, right sided hearing loss,thyroid disease who presented to ER after experiencing epigastric "fluttering" after taking Fosomax tablet. The "fluttering: feeling came and went 3 times. She became anxious and felt hot. She also said she was panting because of feeling hot, but not actually short of breath. She denies any pain, dizziness, weakness, or dizziness. As a result of CVRF, we are asked to see her for cardiac evaluation.  On arrival to ER, BP 133/61, HR 77, afebrile. O2 sat 100%. She was slightly anemic at 10.6, 33.5. All other labs were found to unremarkable with the exception of GFR at 55. Troponin negative X 3. EKG NSR with RBBB and intraventricular conduction delay.  She was treated with GI cocktail and continued on home medications.     Allergies  Allergen Reactions  . Levaquin [Levofloxacin In D5w] Swelling    To mouth  . Macrobid [Nitrofurantoin Macrocrystal]     Patient can't remember reaction.    Medications Scheduled Medications: . heparin  5,000 Units Subcutaneous 3 times per day  . levothyroxine  112 mcg Oral QAC breakfast  . lisinopril  10 mg Oral Daily  . nicotine  21 mg Transdermal Daily  . pantoprazole  40 mg Oral Daily  . PARoxetine  40 mg Oral Daily  . pravastatin  20 mg Oral q1800  . senna  1 tablet Oral BID    Infusions: . sodium chloride      PRN Medications: acetaminophen, ALPRAZolam, gi cocktail, HYDROcodone-acetaminophen, hydrOXYzine, ondansetron  **OR** ondansetron (ZOFRAN) IV, polyethylene glycol   Past Medical History  Diagnosis Date  . Anxiety   . Hypothyroidism   . Hypertension   . Lumbar spondylosis 01/23/2011  . COPD (chronic obstructive pulmonary disease)     MIld  . Hyperlipidemia   . Fibromyalgia   . Gastritis   . Osteoporosis   . Stroke   . OZHYQMVH(846.9)     Past Surgical History  Procedure Laterality Date  . Cholecystectomy    . Gastric bypass    . Esophagogastroduodenoscopy    . Knee surgery Left     Family History  Problem Relation Age of Onset  . Heart attack Father     Social History [name] reports that she has been smoking Cigarettes.  She has a 60 pack-year smoking history. She does not have any smokeless tobacco history on file. [name] reports that she does not drink alcohol.  Review of Systems Otherwise reviewed and negative except as outlined.  Physical Examination Blood pressure 116/60, pulse 78, temperature 98.3 F (36.8 C), temperature source Oral, resp. rate 18, height 5\' 2"  (1.575 m), weight 183 lb (83.008 kg), SpO2 92 %.  Intake/Output Summary (Last 24 hours) at 03/30/14 1045 Last data filed at 03/30/14 0900  Gross per 24 hour  Intake    240 ml  Output      0 ml  Net    240 ml    Telemetry: NSR with RBBB  GEN: No acute distress. HEENT: Conjunctiva and lids normal, oropharynx clear with moist mucosa. Neck: Supple, no elevated JVP or carotid  bruits, no thyromegaly. Lungs: Clear to auscultation, nonlabored breathing at rest. Cardiac: Regular rate and rhythm, 1/6 systolic murmur at the LSB,  Abdomen: Soft, nontender, no hepatomegaly, bowel sounds present, no guarding or rebound. Extremities: No pitting edema, distal pulses 2+. Skin: Warm and dry. Musculoskeletal: No kyphosis. Neuropsychiatric: Alert and oriented x3, aphasia is noted.   Prior Cardiac Testing/Procedures None  Lab Results  Basic Metabolic Panel:  Recent Labs Lab 03/29/14 1459  03/30/14 0719  NA 139 138  K 4.8 4.2  CL 102 104  CO2 24 22  GLUCOSE 86 93  BUN 13 9  CREATININE 0.99 0.68  CALCIUM 9.0 8.6    Liver Function Tests:  Recent Labs Lab 03/29/14 1459  AST 32  ALT 15  ALKPHOS 71  BILITOT <0.2*  PROT 6.2  ALBUMIN 3.4*    CBC:  Recent Labs Lab 03/29/14 1459 03/30/14 0719  WBC 10.5 7.3  NEUTROABS 7.5  --   HGB 10.6* 9.8*  HCT 33.5* 30.4*  MCV 84.2 82.8  PLT 314 288    Cardiac Enzymes:  Recent Labs Lab 03/29/14 1459 03/29/14 2022 03/29/14 2256  TROPONINI <0.30 <0.30 <0.30    Radiology: Dg Abd Acute W/chest  03/29/2014   CLINICAL DATA:  Central chest discomfort today with bloating. Initial encounter.  EXAM: ACUTE ABDOMEN SERIES (ABDOMEN 2 VIEW & CHEST 1 VIEW)  COMPARISON:  Abdominal CT 04/28/2012. Acute abdominal series 01/20/2011.  FINDINGS: There are significantly lower lung volumes with associated bibasilar atelectasis. No focal airspace disease or significant pleural effusion is seen. The heart size and mediastinal contours are stable.  There are multiple surgical clips in the upper abdomen and pelvis. Multiple pelvic calcifications are grossly stable. The bowel gas pattern is normal. There is no free intraperitoneal air. There are stable degenerative changes of the lumbar spine and both hips.  IMPRESSION: 1. Suboptimal inspiration with resulting bibasilar atelectasis. 2. No acute abdominal findings.   Electronically Signed   By: Camie Patience M.D.   On: 03/29/2014 15:58     ECG: NSR with RBBB and intraventricular conduction delay.    Impression and Recommendations  1. Atypical cardiac presentation for chest pain: She did not actually have chest pain. Only epigastric fluttering after taking Fosomax tablet. Had also taken stool softener the evening before, and had good BM. Felt great until then. Denies dyspnea or other associated cardiac symptomology. She has not had cardiac work up in the past. Review of records demonstrates  that she had been ordered echo in 2013 after CVA. There is no report to review.  Agree with echo for LV fx. If significant LV dysfunction can consider further cardiac work up. If normal would continue to follow as OP.   2. Hypertension: Currently well controlled on lisinopril.Creatinine 0.68 with GFR of 85,.  3. Epigastric discomfort: More like fluttering after taking Fosomax with flushing, she was panting with the flushing because she felt hot. Was bending over picking up pictures. May be related to this. Consider GI work up if symptoms persist. Has seen Dr. Laural Golden in the past.   4. Hyperlipidemia: On pravastatin.   5. Hx of Non-hemorrhagic L MCA Infarct: Residual aphasia and hearing loss on the right. Non-obstructive carotid disease per ultrasound in 2013.   Signed: Phill Myron. Lawrence NP  03/30/2014, 10:45 AM Co-Sign MD

## 2014-03-30 NOTE — Progress Notes (Signed)
*  PRELIMINARY RESULTS* Echocardiogram 2D Echocardiogram has been performed.  [name] 03/30/2014, 9:25 AM

## 2014-03-30 NOTE — Care Management Note (Signed)
    Page 1 of 1   03/30/2014     11:09:33 AM CARE MANAGEMENT NOTE 03/30/2014  Patient:  [name], [name]   Account Number:  [id number]  Date Initiated:  03/30/2014  Documentation initiated by:  Jolene Provost  Subjective/Objective Assessment:   Pt is from home with husband. Pt has been to [facility] in the past for rehab. Pt has no HH services, DME or medication needs prior to admission.     Action/Plan:   Pt plans to discharge home. Will continue to follow for CM needs.   Anticipated DC Date:  03/31/2014   Anticipated DC Plan:  [place]  CM consult      Choice offered to / List presented to:             Status of service:  In process, will continue to follow Medicare Important Message given?   (If response is "NO", the following Medicare IM given date fields will be blank) Date Medicare IM given:   Medicare IM given by:   Date Additional Medicare IM given:   Additional Medicare IM given by:    Discharge Disposition:  HOME/SELF CARE  Per UR Regulation:    If discussed at Long Length of Stay Meetings, dates discussed:    Comments:  03/30/2014 [place], RN, MSN, [facility]

## 2014-03-31 DIAGNOSIS — R0789 Other chest pain: Secondary | ICD-10-CM

## 2014-03-31 DIAGNOSIS — R1031 Right lower quadrant pain: Secondary | ICD-10-CM | POA: Diagnosis not present

## 2014-03-31 LAB — BASIC METABOLIC PANEL
Anion gap: 11 (ref 5–15)
BUN: 8 mg/dL (ref 6–23)
CHLORIDE: 107 meq/L (ref 96–112)
CO2: 23 meq/L (ref 19–32)
Calcium: 8.5 mg/dL (ref 8.4–10.5)
Creatinine, Ser: 0.72 mg/dL (ref 0.50–1.10)
GFR calc Af Amer: >90 mL/min (ref >90)
GFR, EST NON AFRICAN AMERICAN: 83 mL/min — AB (ref >90)
GLUCOSE: 83 mg/dL (ref 70–99)
Potassium: 4.4 mEq/L (ref 3.7–5.3)
SODIUM: 141 meq/L (ref 137–147)

## 2014-03-31 LAB — CBC
HEMATOCRIT: 30.8 % — AB (ref 36.0–46.0)
Hemoglobin: 9.9 g/dL — ABNORMAL LOW (ref 12.0–15.0)
MCH: 27.1 pg (ref 26.0–34.0)
MCHC: 32.1 g/dL (ref 30.0–36.0)
MCV: 84.4 fL (ref 78.0–100.0)
Platelets: 283 10*3/uL (ref 150–400)
RBC: 3.65 MIL/uL — AB (ref 3.87–5.11)
RDW: 18.7 % — ABNORMAL HIGH (ref 11.5–15.5)
WBC: 9.2 10*3/uL (ref 4.0–10.5)

## 2014-03-31 LAB — FERRITIN: FERRITIN: 8 ng/mL — AB (ref 10–291)

## 2014-03-31 MED ORDER — ASPIRIN 81 MG PO CHEW: 81.0000 mg | CHEWABLE_TABLET | Freq: Every day | ORAL | Status: DC

## 2014-03-31 MED ORDER — LISINOPRIL 20 MG PO TABS: 20.0000 mg | ORAL_TABLET | Freq: Every day | ORAL | Status: DC

## 2014-03-31 NOTE — Progress Notes (Signed)
[name] discharged home per MD order.  Discharge instructions reviewed and discussed with the patient and family at bedside, all questions and concerns answered. Copy of instructions and scripts given to patient.    Medication List    TAKE these medications        alendronate 70 MG tablet  Commonly known as:  FOSAMAX  TAKE 1 TABLET BY MOUTH WEEKLY     ALPRAZolam 1 MG tablet  Commonly known as:  XANAX  TAKE 1 TABLET BY MOUTH TWICE A DAY     antipyrine-benzocaine otic solution  Commonly known as:  AURALGAN  Place 3-4 drops into both ears every 2 (two) hours as needed for ear pain.     aspirin 81 MG chewable tablet  Chew 1 tablet (81 mg total) by mouth daily.     HYDROcodone-acetaminophen 10-325 MG per tablet  Commonly known as:  [place] 1 TABLET FOUR TIMES DAILY AS NEEDED.MUST LAST 30 DAYS     hydrOXYzine 25 MG tablet  Commonly known as:  ATARAX/VISTARIL  TAKE 1 TABLET BY MOUTH EVERY 6 HOURS AS NEEDED FOR ITCHING     levothyroxine 112 MCG tablet  Commonly known as:  SYNTHROID, LEVOTHROID  TAKE 1 TABLET BY MOUTH EVERY DAY     lisinopril 20 MG tablet  Commonly known as:  PRINIVIL,ZESTRIL  Take 1 tablet (20 mg total) by mouth daily.     MIRALAX powder  Generic drug:  polyethylene glycol powder  Take 17 g by mouth daily.     multivitamin with minerals Tabs tablet  Take 1 tablet by mouth daily.     nortriptyline 50 MG capsule  Commonly known as:  PAMELOR  TAKE ONE CAPSULE AT BEDTIME     OSTEO BI-FLEX ADV DOUBLE ST PO  Take 1 tablet by mouth daily.     pantoprazole 40 MG tablet  Commonly known as:  PROTONIX  TAKE 1 TABLET BY MOUTH EVERY DAY **STOP RANITIDINE     PARoxetine 40 MG tablet  Commonly known as:  PAXIL  TAKE 1 TABLET BY MOUTH EVERY DAY AS DIRECTED     pravastatin 20 MG tablet  Commonly known as:  PRAVACHOL  TAKE 1 TABLET EVERY DAY        Patients skin is clean, dry and intact, no evidence of skin break down. IV site discontinued and  catheter remains intact. Site without signs and symptoms of complications. Dressing and pressure applied.  Patient escorted to car by Lovena Le, RN and Catie, RN in a wheelchair,  no distress noted upon discharge.  Regino Bellow 03/31/2014 11:57 AM

## 2014-03-31 NOTE — Discharge Summary (Signed)
Physician Discharge Summary  [name] LDJ:[id number] DOB: 03-16-1941 DOA: 03/29/2014  PCP: Rubbie Battiest, MD  Admit date: 03/29/2014 Discharge date: 03/31/2014  Time spent:  minutes  Recommendations for Outpatient Follow-up:  1. PCP Dr Wolfgang Phoenix in 1-2 weeks for evaluation of symptoms. Evaluation of BP control as lisinopril increased to 2omg per cardiology recommendations. Discuss need for OP cardiology follow up. Follow Hg.    Discharge Diagnoses:  Principal Problem:   Chest pain Active Problems:   Anxiety   Fibromyalgia   GERD (gastroesophageal reflux disease)   HTN (hypertension), malignant   Hypothyroidism   Aphasia due to stroke   Apraxia due to stroke   Tobacco abuse   Depression   Anemia   Discharge Condition: stable  Diet recommendation: heart healthy  Filed Weights   03/29/14 1905  Weight: 83.008 kg (183 lb)    History of present illness:  73 yo old woman presents to [facility] 03/29/14 with cc chest pain. Started at 11:30am while filing papers. Located mid chest and non-radiating. Associated w/ diaphoresis, mild SOB and palpitations. EMS was called. Pt took 2 ASA. CP resolved upon arrival at ED. No record of nitro beign given. Continues to smoke 1ppd. Also w/ vague intermittent RLQ/chest pain that is ttp. H/o falls in past most recent 05/2013.  Chest pain not worsened w/ deep breathing just palpation. Pt w/ h/o constipation . Last BM 11/16 at 5:30 am. Of note pt w/ Apraxia and aphasia after previous stroke. Assisted by husband in ED.   Hospital Course:  1. Chest pain with associated palpitations, shortness of breath, and diaphoresis. History of cerebrovascular disease/stroke, hypertension, and tobacco abuse.Chest pain resolved in ED. No further episodes of chest pain during hospitalization.  Troponin negative x3. ECG with SR and RBBB.  Echo with mild LVH, EF 65-70% and grade 1 diastolic dysfunction and mild aortic regurgitation.. TSH and lipid panel within limits of  normal. Provided with low dose aspirin and continued statin. Evaluated by cardiology who opine no further inpatient cardiology work up needed and recommended increasing lisinopril to 20mg  for optimal BP control.  Hypertension. Her blood pressure was elevated in the ED. Improved control at discharge. Lisinopril dose increased per cardiology recommendation.   Hyperlipidemia. Continue statin. Fasting lipid profile within limits of normal.   Tobacco abuse. The patient was strongly advised to stop smoking.   History of previous left MCA stroke in 2013. Mild residual right-sided weakness and apraxia and ataxia.  PT evaluation with no follow up recommended.   Hypothyroidism. TSH normal.   Normocytic anemia. Anemia panel with Iron level 39. Ferritin in process at discharge. Recommend OP follow up.    Procedures:   Echo :Wall thickness was increased in a pattern of mild LVH. Systolic function was vigorous. The estimated ejection fraction was in the range of 65% to 70%. Calculated LVEF utilizing longitudinal strain method: 68.3%. Wall motion was normal; there were no regional wall motion abnormalities. Doppler parameters are consistent with abnormal left ventricular relaxation (grade 1 diastolic dysfunction).  Consultations:  cardiology  Discharge Exam: Filed Vitals:   03/31/14 0603  BP: 132/75  Pulse: 75  Temp: 98.4 F (36.9 C)  Resp: 18    General: well nourished. Appears well Cardiovascular: RRR No MGR No LE edema Respiratory: normal effort BS clear bilaterally no wheeze  Discharge Instructions You were cared for by a hospitalist during your hospital stay. If you have any questions about your discharge medications or the care you received while you were in  the hospital after you are discharged, you can call the unit and asked to speak with the hospitalist on call if the hospitalist that took care of you is not available. Once you are discharged, your primary  care physician will handle any further medical issues. Please note that NO REFILLS for any discharge medications will be authorized once you are discharged, as it is imperative that you return to your primary care physician (or establish a relationship with a primary care physician if you do not have one) for your aftercare needs so that they can reassess your need for medications and monitor your lab values.   Current Discharge Medication List    START taking these medications   Details  aspirin 81 MG chewable tablet Chew 1 tablet (81 mg total) by mouth daily.      CONTINUE these medications which have CHANGED   Details  lisinopril (PRINIVIL,ZESTRIL) 20 MG tablet Take 1 tablet (20 mg total) by mouth daily. Qty: 30 tablet, Refills: 0      CONTINUE these medications which have NOT CHANGED   Details  alendronate (FOSAMAX) 70 MG tablet TAKE 1 TABLET BY MOUTH WEEKLY Qty: 12 tablet, Refills: 1    ALPRAZolam (XANAX) 1 MG tablet TAKE 1 TABLET BY MOUTH TWICE A DAY Qty: 60 tablet, Refills: 5    antipyrine-benzocaine (AURALGAN) otic solution Place 3-4 drops into both ears every 2 (two) hours as needed for ear pain.  Refills: 1    HYDROcodone-acetaminophen ([place]) 10-325 MG per tablet TAKE 1 TABLET FOUR TIMES DAILY AS NEEDED.MUST LAST 30 DAYS Qty: 120 tablet, Refills: 0    hydrOXYzine (ATARAX/VISTARIL) 25 MG tablet TAKE 1 TABLET BY MOUTH EVERY 6 HOURS AS NEEDED FOR ITCHING Qty: 25 tablet, Refills: 5    levothyroxine (SYNTHROID, LEVOTHROID) 112 MCG tablet TAKE 1 TABLET BY MOUTH EVERY DAY Qty: 90 tablet, Refills: 1    Misc Natural Products (OSTEO BI-FLEX ADV DOUBLE ST PO) Take 1 tablet by mouth daily.    Multiple Vitamin (MULTIVITAMIN WITH MINERALS) TABS Take 1 tablet by mouth daily.    nortriptyline (PAMELOR) 50 MG capsule TAKE ONE CAPSULE AT BEDTIME Qty: 90 capsule, Refills: 1    pantoprazole (PROTONIX) 40 MG tablet TAKE 1 TABLET BY MOUTH EVERY DAY **STOP RANITIDINE Qty: 90 tablet,  Refills: 1    PARoxetine (PAXIL) 40 MG tablet TAKE 1 TABLET BY MOUTH EVERY DAY AS DIRECTED Qty: 90 tablet, Refills: 1    polyethylene glycol powder (MIRALAX) powder Take 17 g by mouth daily.      pravastatin (PRAVACHOL) 20 MG tablet TAKE 1 TABLET EVERY DAY Qty: 90 tablet, Refills: 1       Allergies  Allergen Reactions  . Levaquin [Levofloxacin In D5w] Swelling    To mouth  . Macrobid [Nitrofurantoin Macrocrystal]     Patient can't remember reaction.   Follow-up Information    Follow up with Rubbie Battiest, MD. Schedule an appointment as soon as possible for a visit in 1 week.   Specialty:  Family Medicine   Why:  for evaluation of sypmptoms   Contact information:   [place] [address] [phone]        The results of significant diagnostics from this hospitalization (including imaging, microbiology, ancillary and laboratory) are listed below for reference.    Significant Diagnostic Studies: Dg Bone Density  03/01/2014   EXAM: DUAL X-RAY ABSORPTIOMETRY (DXA) FOR BONE MINERAL DENSITY  IMPRESSION: Ordering Physician:  Dr. Mikey Kirschner,  Your  patient [name] completed a BMD test on 03/01/2014 using the [place] (software version: 14.10) manufactured by [employer]. The following summarizes the results of our evaluation. PATIENT BIOGRAPHICAL: Name: [name], [name] Patient ID: [id number] Birth Date: May 13, 1941 Height: 61.0 in. Gender: Female Exam Date: 03/01/2014 Weight: 183.0 lbs. Indications: Caucasian, Follow up Osteoporosis, Height Loss, History of Fracture (Adult), Post Menopausal Fractures: Tib Fib Treatments: Fosamax, Multivitamin DENSITOMETRY RESULTS: Site         Region     Measured Date Measured Age WHO Classification Young Adult T-score BMD         %Change vs. Previous Significant Change (*) DualFemur Neck Left 3/01/2014 [age] Osteoporosis -2.8 0.651 g/cm2  Left Forearm Radius 33% 03/01/2014 73.7  Osteoporosis -3.3 0.479 g/cm2 ASSESSMENT: This patient is considered osteoporotic by [place] Organization (WHO) Criteria. (Lumbar spine was not utilized due to advanced degenerative changes.)(Patient does not meet criteria for FRAX assessment.) World Health Organization [facility]) criteria for post-menopausal, Caucasian Women: Normal:       T-score at or above -1 SD Osteopenia:   T-score between -1 and -2.5 SD Osteoporosis: T-score at or below -2.5 SD  RECOMMENDATIONS: [place] recommends that FDA-approved medial therapies be considered in postmenopausal women and men age 37 or older with a: 1. Hip or vertebral (clinical or morphometric) fracture. 2. T-Score of < -2.5 at the spine or hip. 3. Ten-year fracture probability by FRAX of 3% or greater for hip fracture or 20% or greater for major osteoporotic fracture.  All treatment decisions require clinical judgment and consideration of individual patient factors, including patient preferences, co-morbidities, previous drug use, risk factors not captured in the FRAX model (e.g. falls, vitamin D deficiency, increased bone turnover, interval significant decline in bone density) and possible under-or over-estimation of fracture risk by FRAX.  All patients should ensure an adequate intake of dietary calcium (1200 mg/d) and vitamin D (800 IU daily) unless contraindicated.  FOLLOW-UP: People with diagnosed cases of osteoporosis or osteopenia should be regularly tested for bone mineral density. For patients eligible for Medicare, routine testing is allowed once every 2 years. Testing frequency can be increased for patients who have rapidly progressing disease, or for those who are receiving medical therapy to restore bone mass.  I have reviewed this report, and agree with the above findings.  [facility]   Electronically Signed   By: Curlene Dolphin M.D.   On: 03/01/2014 13:33   Dg Abd Acute W/chest  03/29/2014   CLINICAL DATA:  Central  chest discomfort today with bloating. Initial encounter.  EXAM: ACUTE ABDOMEN SERIES (ABDOMEN 2 VIEW & CHEST 1 VIEW)  COMPARISON:  Abdominal CT 04/28/2012. Acute abdominal series 01/20/2011.  FINDINGS: There are significantly lower lung volumes with associated bibasilar atelectasis. No focal airspace disease or significant pleural effusion is seen. The heart size and mediastinal contours are stable.  There are multiple surgical clips in the upper abdomen and pelvis. Multiple pelvic calcifications are grossly stable. The bowel gas pattern is normal. There is no free intraperitoneal air. There are stable degenerative changes of the lumbar spine and both hips.  IMPRESSION: 1. Suboptimal inspiration with resulting bibasilar atelectasis. 2. No acute abdominal findings.   Electronically Signed   By: Camie Patience M.D.   On: 03/29/2014 15:58    Microbiology: No results found for this or any previous visit (from the past 240 hour(s)).   Labs: Basic Metabolic Panel:  Recent Labs Lab 03/29/14 1459 03/30/14  0719 03/31/14 0517  NA 139 138 141  K 4.8 4.2 4.4  CL 102 104 107  CO2 24 22 23   GLUCOSE 86 93 83  BUN 13 9 8   CREATININE 0.99 0.68 0.72  CALCIUM 9.0 8.6 8.5   Liver Function Tests:  Recent Labs Lab 03/29/14 1459  AST 32  ALT 15  ALKPHOS 71  BILITOT <0.2*  PROT 6.2  ALBUMIN 3.4*    Recent Labs Lab 03/29/14 1459  LIPASE 24   No results for input(s): AMMONIA in the last 168 hours. CBC:  Recent Labs Lab 03/29/14 1459 03/30/14 0719 03/31/14 0517  WBC 10.5 7.3 9.2  NEUTROABS 7.5  --   --   HGB 10.6* 9.8* 9.9*  HCT 33.5* 30.4* 30.8*  MCV 84.2 82.8 84.4  PLT 314 288 283   Cardiac Enzymes:  Recent Labs Lab 03/29/14 1459 03/29/14 2022 03/29/14 2256  TROPONINI <0.30 <0.30 <0.30   BNP: BNP (last 3 results) No results for input(s): PROBNP in the last 8760 hours. CBG: No results for input(s): GLUCAP in the last 168 hours.     SignedRadene Gunning  [facility] 03/31/2014, 9:12 AM

## 2014-03-31 NOTE — Evaluation (Signed)
Physical Therapy Evaluation Patient Details Name: [name] MRN: [id number] DOB: 1940-07-26 Today's Date: 03/31/2014   History of Present Illness  [name] is a 73 y.o. female  CHest pain. Started today at 11:30 while filing papers. Mid chest and non-radiating. Associated w/ diaphoresis, mild SOB and palpitations. EMS was called. Pt took 2 ASA at home. CP resolved upon arrival at ED. No record of nitro beign given.  Continues to smoke 1ppd.  Of note pt w/ Apraxia and aphasia after previous stroke. Assisted by husband in ED.     Clinical Impression  Pt is a 73 year old female who presents to PT with dx of chest pain.  Pt has a hx of stroke with resultant apraxia and aphasia.  Hx received from pt, though due to hx of aphasia, hx if questionable; no family to determine hx or PLOF.  During evaluation, pt was mod (I) with bed mobility skills and transfers.  For gait assessment, pt grabbed onto RW to initiate gait, though after ~200 feet, pt questioned why she had to use RW and the rest of gait assessment completed without use of AD.  Noted increased ER of Lt LE during gait, though no LOB during gait without use of AD.  Pt appears at baseline level of function, though unable to completely determine as pt unable to state PLOF secondary to aphasia. Assume pt did not use AD for gait prior to hospitalization, as pt questioned why she had to use one in the hospital, though did state she cane, RW, and W/C at home if needed.  Pt to be d/c from acute PT services; no follow up recommended.  No DME recommendations.     Follow Up Recommendations No PT follow up    Equipment Recommendations  None recommended by PT       Precautions / Restrictions Precautions Precautions: Fall Precaution Comments: Hx of aphasia and apraxia from previous stroke Restrictions Weight Bearing Restrictions: No      Mobility  Bed Mobility Overal bed mobility: Modified Independent                 Transfers Overall transfer level: Modified independent Equipment used: Rolling walker (2 wheeled)                Ambulation/Gait Ambulation/Gait assistance: Supervision;Min guard Ambulation Distance (Feet): 400 Feet Assistive device: Rolling walker (2 wheeled);None Gait Pattern/deviations: Step-through pattern;Decreased stride length;Decreased dorsiflexion - right;Decreased dorsiflexion - left   Gait velocity interpretation: at or above normal speed for age/gender General Gait Details: Pt initiated gait assessment with use of RW (pt grabbed onto RW during gait), though later questioned why she had to use it and completed last 200 feet without AD.  Noted slight increased ER of Lt > Rt LE during gait.        Balance Overall balance assessment: Modified Independent Sitting-balance support: Feet supported;Single extremity supported Sitting balance-Leahy Scale: Good     Standing balance support: During functional activity;No upper extremity supported Standing balance-Leahy Scale: Fair                               Pertinent Vitals/Pain Pain Assessment: Faces Faces Pain Scale: Hurts little more (Pt unable to state number, states "hurts some") Pain Location: (B) LE Pain Intervention(s): Premedicated before session;Repositioned;Limited activity within patient's tolerance    Home Living Family/patient expects to be discharged to:: Private residence Living Arrangements: Spouse/significant other Available Help  at Discharge: Family;Available 24 hours/day Type of Home: Mobile home         Home Equipment: Gilford Rile - 2 wheels;Cane - single point;Wheelchair - manual Additional Comments: Pt is a questionable historian due to hx of aphasia; no family present to determine if hx stated is correct.     Prior Function Level of Independence: Independent with assistive device(s);Independent         Comments: Pt is a questionable historian due to hx of aphasia; pt  reports she has AD, though unable to state if/how often she uses them.  Pt did grab for RW during PT evaluation for gait, though later questioned during gait assessment why she was using it, and completed gait assessment without RW.         Extremity/Trunk Assessment               Lower Extremity Assessment: Overall WFL for tasks assessed         Communication      Cognition Arousal/Alertness: Awake/alert Behavior During Therapy: WFL for tasks assessed/performed Overall Cognitive Status: No family/caregiver present to determine baseline cognitive functioning                       Assessment/Plan    PT Assessment Patent does not need any further PT services  PT Diagnosis     PT Problem List    PT Treatment Interventions     PT Goals (Current goals can be found in the Care Plan section) Acute Rehab PT Goals PT Goal Formulation: All assessment and education complete, DC therapy     End of Session Equipment Utilized During Treatment: Gait belt Activity Tolerance: Patient tolerated treatment well Patient left: in bed;with call bell/phone within reach;with bed alarm set      Functional Assessment Tool Used: Clinical Judgement Functional Limitation: Mobility: Walking and moving around Mobility: Walking and Moving Around Current Status ([id number]): At least 1 percent but less than 20 percent impaired, limited or restricted Mobility: Walking and Moving Around Goal Status [phone]): At least 1 percent but less than 20 percent impaired, limited or restricted Mobility: Walking and Moving Around Discharge Status [phone]): At least 1 percent but less than 20 percent impaired, limited or restricted    Time: 0809-0828 PT Time Calculation (min) (ACUTE ONLY): 19 min   Charges:   PT Evaluation $Initial PT Evaluation Tier I: 1 Procedure     PT G Codes:   Functional Assessment Tool Used: Clinical Judgement Functional Limitation: Mobility: Walking and moving around     [facility] 03/31/2014, 8:45 AM

## 2014-04-01 ENCOUNTER — Telehealth: Payer: Self-pay | Admitting: Family Medicine

## 2014-04-01 NOTE — Telephone Encounter (Signed)
TCNA 

## 2014-04-01 NOTE — Telephone Encounter (Signed)
Patient called today regarding a prescription that she was given by Hoyle Sauer.  She said that [name] (her husband) lost it and she said that she gets it and the doctor usually makes it better?  Please advise. I did not understand what she was asking for.  She said she didn't know the name.

## 2014-04-02 NOTE — Telephone Encounter (Signed)
Please clarify which med. The only one prescribed recently was her lisinopril.

## 2014-04-02 NOTE — Telephone Encounter (Signed)
Patient thought she needed an order to go have her mammogram done and thought she lost it. Advised patient order in computer she just needs to report to radiology at [facility] at scheduled time. Patient verbalized understanding.

## 2014-04-06 ENCOUNTER — Ambulatory Visit: Payer: Medicare Other | Admitting: Family Medicine

## 2014-04-06 HISTORY — PX: ESOPHAGOGASTRODUODENOSCOPY: SHX1529

## 2014-04-07 ENCOUNTER — Ambulatory Visit: Payer: Medicare Other | Admitting: Family Medicine

## 2014-04-16 ENCOUNTER — Ambulatory Visit (INDEPENDENT_AMBULATORY_CARE_PROVIDER_SITE_OTHER): Payer: Medicare Other | Admitting: Family Medicine

## 2014-04-16 ENCOUNTER — Encounter: Payer: Self-pay | Admitting: Family Medicine

## 2014-04-16 VITALS — BP 136/84 | Ht 62.0 in | Wt 183.0 lb

## 2014-04-16 DIAGNOSIS — I959 Hypotension, unspecified: Secondary | ICD-10-CM

## 2014-04-16 MED ORDER — LISINOPRIL 20 MG PO TABS: 10.0000 mg | ORAL_TABLET | Freq: Every day | ORAL | Status: DC

## 2014-04-16 NOTE — Progress Notes (Signed)
   Subjective:    Patient ID: [name], female    DOB: 1941/04/17, 73 y.o.   MRN: [id number]  HPI Patient is here today for an ER follow up frm 11/17.  She went for chest pain. Was there for 2 nights, per husband [place].   Pt says she is having leg weakness.  Decreased appetite w/ nausea ever since she was released from the hospital.   Patient was in the hospital originally for chest pain. Full records reviewed in from inpatient.  Continues to have symptoms stand to weakness. On further history it is more like lightheadedness. Worse after standing.    Review of Systems No chest pain currently. Some shortness of breath with exertion. Lightheaded after standing. No nausea ROS otherwise negative    Objective:   Physical Exam  Alert no apparent distress. Blood pressure 96/70 right arm 100/68 left arm. Ankles without edema Lungs clear. Heart regular in rhythm.      Assessment & Plan:  Impression 1 orthostatic hypotension discussed. Likely result from doubling Prinivil. Prinivil was originally doubled because of left ventricular hypertrophy evident on echocardiogram. Since this was only mild. Inpatient symptomatic will decrease back down. #2 status post stroke #3 chest pain myocardial infarction and ischemia ruled out plan recheck in one month. Home health referral. Cut lisinopril in half. WSL

## 2014-04-26 ENCOUNTER — Encounter (HOSPITAL_COMMUNITY): Payer: Self-pay | Admitting: Emergency Medicine

## 2014-04-26 ENCOUNTER — Inpatient Hospital Stay (HOSPITAL_COMMUNITY)
Admission: EM | Admit: 2014-04-26 | Discharge: 2014-04-30 | DRG: 392 | Disposition: A | Discharge status: Home Health | Payer: Medicare Other | Attending: Family Medicine | Admitting: Family Medicine

## 2014-04-26 ENCOUNTER — Emergency Department (HOSPITAL_COMMUNITY): Payer: Medicare Other

## 2014-04-26 DIAGNOSIS — M79604 Pain in right leg: Secondary | ICD-10-CM

## 2014-04-26 DIAGNOSIS — E860 Dehydration: Secondary | ICD-10-CM | POA: Diagnosis present

## 2014-04-26 DIAGNOSIS — R6510 Systemic inflammatory response syndrome (SIRS) of non-infectious origin without acute organ dysfunction: Secondary | ICD-10-CM

## 2014-04-26 DIAGNOSIS — R609 Edema, unspecified: Secondary | ICD-10-CM | POA: Diagnosis not present

## 2014-04-26 DIAGNOSIS — Z800 Family history of malignant neoplasm of digestive organs: Secondary | ICD-10-CM | POA: Diagnosis not present

## 2014-04-26 DIAGNOSIS — F17210 Nicotine dependence, cigarettes, uncomplicated: Secondary | ICD-10-CM | POA: Diagnosis present

## 2014-04-26 DIAGNOSIS — I5032 Chronic diastolic (congestive) heart failure: Secondary | ICD-10-CM | POA: Diagnosis present

## 2014-04-26 DIAGNOSIS — M797 Fibromyalgia: Secondary | ICD-10-CM | POA: Diagnosis present

## 2014-04-26 DIAGNOSIS — I959 Hypotension, unspecified: Secondary | ICD-10-CM | POA: Diagnosis not present

## 2014-04-26 DIAGNOSIS — E669 Obesity, unspecified: Secondary | ICD-10-CM | POA: Diagnosis present

## 2014-04-26 DIAGNOSIS — D649 Anemia, unspecified: Secondary | ICD-10-CM | POA: Diagnosis present

## 2014-04-26 DIAGNOSIS — Z8249 Family history of ischemic heart disease and other diseases of the circulatory system: Secondary | ICD-10-CM

## 2014-04-26 DIAGNOSIS — K529 Noninfective gastroenteritis and colitis, unspecified: Secondary | ICD-10-CM | POA: Insufficient documentation

## 2014-04-26 DIAGNOSIS — E861 Hypovolemia: Secondary | ICD-10-CM | POA: Diagnosis present

## 2014-04-26 DIAGNOSIS — F419 Anxiety disorder, unspecified: Secondary | ICD-10-CM | POA: Diagnosis not present

## 2014-04-26 DIAGNOSIS — F418 Other specified anxiety disorders: Secondary | ICD-10-CM | POA: Diagnosis present

## 2014-04-26 DIAGNOSIS — A419 Sepsis, unspecified organism: Secondary | ICD-10-CM

## 2014-04-26 DIAGNOSIS — N289 Disorder of kidney and ureter, unspecified: Secondary | ICD-10-CM

## 2014-04-26 DIAGNOSIS — N179 Acute kidney failure, unspecified: Secondary | ICD-10-CM

## 2014-04-26 DIAGNOSIS — F32A Depression, unspecified: Secondary | ICD-10-CM | POA: Diagnosis present

## 2014-04-26 DIAGNOSIS — R404 Transient alteration of awareness: Secondary | ICD-10-CM | POA: Diagnosis not present

## 2014-04-26 DIAGNOSIS — K219 Gastro-esophageal reflux disease without esophagitis: Secondary | ICD-10-CM | POA: Diagnosis present

## 2014-04-26 DIAGNOSIS — I10 Essential (primary) hypertension: Secondary | ICD-10-CM | POA: Diagnosis present

## 2014-04-26 DIAGNOSIS — E785 Hyperlipidemia, unspecified: Secondary | ICD-10-CM | POA: Diagnosis present

## 2014-04-26 DIAGNOSIS — Z9884 Bariatric surgery status: Secondary | ICD-10-CM | POA: Diagnosis not present

## 2014-04-26 DIAGNOSIS — F329 Major depressive disorder, single episode, unspecified: Secondary | ICD-10-CM | POA: Diagnosis not present

## 2014-04-26 DIAGNOSIS — J449 Chronic obstructive pulmonary disease, unspecified: Secondary | ICD-10-CM | POA: Diagnosis present

## 2014-04-26 DIAGNOSIS — D508 Other iron deficiency anemias: Secondary | ICD-10-CM | POA: Diagnosis not present

## 2014-04-26 DIAGNOSIS — D638 Anemia in other chronic diseases classified elsewhere: Secondary | ICD-10-CM | POA: Diagnosis present

## 2014-04-26 DIAGNOSIS — R197 Diarrhea, unspecified: Secondary | ICD-10-CM | POA: Diagnosis not present

## 2014-04-26 DIAGNOSIS — R109 Unspecified abdominal pain: Secondary | ICD-10-CM | POA: Diagnosis not present

## 2014-04-26 DIAGNOSIS — R100 Acute abdomen: Secondary | ICD-10-CM | POA: Diagnosis not present

## 2014-04-26 DIAGNOSIS — M79661 Pain in right lower leg: Secondary | ICD-10-CM | POA: Diagnosis not present

## 2014-04-26 DIAGNOSIS — E039 Hypothyroidism, unspecified: Secondary | ICD-10-CM | POA: Diagnosis present

## 2014-04-26 DIAGNOSIS — M810 Age-related osteoporosis without current pathological fracture: Secondary | ICD-10-CM | POA: Diagnosis present

## 2014-04-26 DIAGNOSIS — Z720 Tobacco use: Secondary | ICD-10-CM | POA: Diagnosis present

## 2014-04-26 DIAGNOSIS — E038 Other specified hypothyroidism: Secondary | ICD-10-CM | POA: Diagnosis not present

## 2014-04-26 DIAGNOSIS — M7989 Other specified soft tissue disorders: Secondary | ICD-10-CM

## 2014-04-26 DIAGNOSIS — R531 Weakness: Secondary | ICD-10-CM | POA: Diagnosis not present

## 2014-04-26 DIAGNOSIS — K6389 Other specified diseases of intestine: Secondary | ICD-10-CM | POA: Diagnosis not present

## 2014-04-26 HISTORY — DX: Tobacco use: Z72.0

## 2014-04-26 HISTORY — DX: Reserved for concepts with insufficient information to code with codable children: IMO0002

## 2014-04-26 HISTORY — DX: Gastro-esophageal reflux disease without esophagitis: K21.9

## 2014-04-26 LAB — COMPREHENSIVE METABOLIC PANEL
ALK PHOS: 103 U/L (ref 39–117)
ALT: 27 U/L (ref 0–35)
AST: 51 U/L — ABNORMAL HIGH (ref 0–37)
Albumin: 2.3 g/dL — ABNORMAL LOW (ref 3.5–5.2)
Anion gap: 12 (ref 5–15)
BUN: 21 mg/dL (ref 6–23)
CHLORIDE: 104 meq/L (ref 96–112)
CO2: 22 meq/L (ref 19–32)
Calcium: 7.8 mg/dL — ABNORMAL LOW (ref 8.4–10.5)
Creatinine, Ser: 1.31 mg/dL — ABNORMAL HIGH (ref 0.50–1.10)
GFR calc Af Amer: 46 mL/min — ABNORMAL LOW (ref >90)
GFR, EST NON AFRICAN AMERICAN: 39 mL/min — AB (ref >90)
GLUCOSE: 102 mg/dL — AB (ref 70–99)
POTASSIUM: 4.8 meq/L (ref 3.7–5.3)
SODIUM: 138 meq/L (ref 137–147)
Total Bilirubin: 0.6 mg/dL (ref 0.3–1.2)
Total Protein: 4.7 g/dL — ABNORMAL LOW (ref 6.0–8.3)

## 2014-04-26 LAB — CBC WITH DIFFERENTIAL/PLATELET
BASOS PCT: 0 % (ref 0–1)
Basophils Absolute: 0 10*3/uL (ref 0.0–0.1)
EOS ABS: 0.1 10*3/uL (ref 0.0–0.7)
Eosinophils Relative: 1 % (ref 0–5)
HCT: 30.1 % — ABNORMAL LOW (ref 36.0–46.0)
Hemoglobin: 10.2 g/dL — ABNORMAL LOW (ref 12.0–15.0)
LYMPHS ABS: 1.2 10*3/uL (ref 0.7–4.0)
Lymphocytes Relative: 7 % — ABNORMAL LOW (ref 12–46)
MCH: 29.3 pg (ref 26.0–34.0)
MCHC: 33.9 g/dL (ref 30.0–36.0)
MCV: 86.5 fL (ref 78.0–100.0)
MONOS PCT: 3 % (ref 3–12)
Monocytes Absolute: 0.5 10*3/uL (ref 0.1–1.0)
Neutro Abs: 16.5 10*3/uL — ABNORMAL HIGH (ref 1.7–7.7)
Neutrophils Relative %: 89 % — ABNORMAL HIGH (ref 43–77)
PLATELETS: 273 10*3/uL (ref 150–400)
RBC: 3.48 MIL/uL — AB (ref 3.87–5.11)
RDW: 20.8 % — ABNORMAL HIGH (ref 11.5–15.5)
WBC: 18.3 10*3/uL — ABNORMAL HIGH (ref 4.0–10.5)

## 2014-04-26 LAB — POC OCCULT BLOOD, ED: Fecal Occult Bld: POSITIVE — AB

## 2014-04-26 LAB — MRSA PCR SCREENING: MRSA BY PCR: NEGATIVE

## 2014-04-26 LAB — CLOSTRIDIUM DIFFICILE BY PCR: Toxigenic C. Difficile by PCR: NEGATIVE

## 2014-04-26 LAB — LIPASE, BLOOD: Lipase: 41 U/L (ref 11–59)

## 2014-04-26 LAB — LACTIC ACID, PLASMA: Lactic Acid, Venous: 2 mmol/L (ref 0.5–2.2)

## 2014-04-26 LAB — TROPONIN I

## 2014-04-26 MED ORDER — ASPIRIN 81 MG PO CHEW
81.0000 mg | CHEWABLE_TABLET | Freq: Every day | ORAL | Status: DC
  Administered 2014-04-27 – 2014-04-30 (×4): 81 mg via ORAL
  Filled 2014-04-26 (×4): qty 1

## 2014-04-26 MED ORDER — CETYLPYRIDINIUM CHLORIDE 0.05 % MT LIQD
7.0000 mL | Freq: Two times a day (BID) | OROMUCOSAL | Status: DC
  Administered 2014-04-26 – 2014-04-30 (×7): 7 mL via OROMUCOSAL

## 2014-04-26 MED ORDER — SODIUM CHLORIDE 0.9 % IV SOLN
INTRAVENOUS | Status: DC
  Administered 2014-04-26: 1000 mL via INTRAVENOUS

## 2014-04-26 MED ORDER — ONDANSETRON HCL 4 MG/2ML IJ SOLN: 4.0000 mg | Freq: Four times a day (QID) | INTRAMUSCULAR | Status: DC | PRN

## 2014-04-26 MED ORDER — PANTOPRAZOLE SODIUM 40 MG PO TBEC
40.0000 mg | DELAYED_RELEASE_TABLET | Freq: Every day | ORAL | Status: DC
  Administered 2014-04-27 – 2014-04-30 (×4): 40 mg via ORAL
  Filled 2014-04-26 (×4): qty 1

## 2014-04-26 MED ORDER — NICOTINE 21 MG/24HR TD PT24
21.0000 mg | MEDICATED_PATCH | Freq: Every day | TRANSDERMAL | Status: DC
  Administered 2014-04-27 – 2014-04-30 (×4): 21 mg via TRANSDERMAL
  Filled 2014-04-26 (×4): qty 1

## 2014-04-26 MED ORDER — SODIUM CHLORIDE 0.9 % IV BOLUS (SEPSIS)
1000.0000 mL | Freq: Once | INTRAVENOUS | Status: AC
  Administered 2014-04-26: 1000 mL via INTRAVENOUS

## 2014-04-26 MED ORDER — HYDROXYZINE HCL 25 MG PO TABS: 25.0000 mg | ORAL_TABLET | Freq: Four times a day (QID) | ORAL | Status: DC | PRN

## 2014-04-26 MED ORDER — ALPRAZOLAM 1 MG PO TABS
1.0000 mg | ORAL_TABLET | Freq: Two times a day (BID) | ORAL | Status: DC
  Administered 2014-04-26: 0.5 mg via ORAL
  Administered 2014-04-27 – 2014-04-30 (×7): 1 mg via ORAL
  Filled 2014-04-26 (×2): qty 2
  Filled 2014-04-26: qty 1
  Filled 2014-04-26 (×3): qty 2
  Filled 2014-04-26: qty 1
  Filled 2014-04-26: qty 2

## 2014-04-26 MED ORDER — ACETAMINOPHEN 650 MG RE SUPP: 650.0000 mg | Freq: Four times a day (QID) | RECTAL | Status: DC | PRN

## 2014-04-26 MED ORDER — ONDANSETRON HCL 4 MG PO TABS: 4.0000 mg | ORAL_TABLET | Freq: Four times a day (QID) | ORAL | Status: DC | PRN

## 2014-04-26 MED ORDER — RISAQUAD PO CAPS
1.0000 | ORAL_CAPSULE | Freq: Every day | ORAL | Status: DC
  Administered 2014-04-27 – 2014-04-30 (×4): 1 via ORAL
  Filled 2014-04-26 (×6): qty 1

## 2014-04-26 MED ORDER — HEPARIN SODIUM (PORCINE) 5000 UNIT/ML IJ SOLN
5000.0000 [IU] | Freq: Three times a day (TID) | INTRAMUSCULAR | Status: DC
  Administered 2014-04-26 – 2014-04-27 (×3): 5000 [IU] via SUBCUTANEOUS
  Filled 2014-04-26 (×3): qty 1

## 2014-04-26 MED ORDER — ACETAMINOPHEN 325 MG PO TABS
650.0000 mg | ORAL_TABLET | Freq: Four times a day (QID) | ORAL | Status: DC | PRN
  Administered 2014-04-27 – 2014-04-30 (×4): 650 mg via ORAL
  Filled 2014-04-26 (×4): qty 2

## 2014-04-26 MED ORDER — HYDROCODONE-ACETAMINOPHEN 10-325 MG PO TABS
1.0000 | ORAL_TABLET | Freq: Four times a day (QID) | ORAL | Status: DC | PRN
  Administered 2014-04-26 – 2014-04-30 (×11): 1 via ORAL
  Filled 2014-04-26 (×11): qty 1

## 2014-04-26 MED ORDER — SODIUM CHLORIDE 0.9 % IV BOLUS (SEPSIS)
500.0000 mL | Freq: Once | INTRAVENOUS | Status: AC
  Administered 2014-04-26: 500 mL via INTRAVENOUS

## 2014-04-26 MED ORDER — SODIUM CHLORIDE 0.9 % IV SOLN: INTRAVENOUS | Status: AC

## 2014-04-26 MED ORDER — PAROXETINE HCL 20 MG PO TABS
40.0000 mg | ORAL_TABLET | Freq: Every day | ORAL | Status: DC
  Administered 2014-04-27 – 2014-04-30 (×4): 40 mg via ORAL
  Filled 2014-04-26 (×4): qty 2

## 2014-04-26 MED ORDER — LEVOTHYROXINE SODIUM 112 MCG PO TABS
112.0000 ug | ORAL_TABLET | Freq: Every day | ORAL | Status: DC
  Administered 2014-04-27 – 2014-04-30 (×4): 112 ug via ORAL
  Filled 2014-04-26 (×4): qty 1

## 2014-04-26 MED ORDER — SODIUM CHLORIDE 0.9 % IV SOLN
INTRAVENOUS | Status: DC
  Administered 2014-04-26: 1000 mL via INTRAVENOUS
  Administered 2014-04-26: 500 mL via INTRAVENOUS
  Administered 2014-04-27: 16:00:00 via INTRAVENOUS
  Administered 2014-04-27: 1000 mL via INTRAVENOUS
  Administered 2014-04-28: 03:00:00 via INTRAVENOUS

## 2014-04-26 NOTE — ED Notes (Signed)
Patient with 5 large loose bowel movements since arrival. Hypotensive.

## 2014-04-26 NOTE — ED Notes (Signed)
Per ems pt c/o abd pain with diarrhea. Generalized pain/weakness and hypotension. Pt alert and oriented x 4. Pale.

## 2014-04-26 NOTE — H&P (Addendum)
[facility] History and Physical  [name] VQQ:[id number] DOB: [birth year] DOA: 04/26/2014  Referring physician: Dr Thurnell Garbe PCP: Rubbie Battiest, MD   Chief Complaint: diarrhea  HPI: [name] is a 73 y.o. female  Started feeling weak and dizzy yesterday. Diarrhea started today. When asked how many episodes, pt states "Too many to count." watery and non-bloody. Mild intermittent lower abd campy sensation. Has not tried anythign to make it better. Associated w/ nausea but no emesis. Denies eating raw or potentially spoiled foods, sick contacts, fevers, recent travel Saw PCP on 12/4/ for hypotension and lisinopril dose decreased from 20mg  to 10mg .  Pt lives at home w/ husband and brother.    Review of Systems:  Constitutional:  No weight loss, night sweats, Fevers, chills,  HEENT:  No headaches, Difficulty swallowing,Tooth/dental problems,Sore throat,  No sneezing, itching, ear ache, nasal congestion, post nasal drip,  Cardio-vascular:  No chest pain, Orthopnea, PND, swelling in lower extremities, anasarca, dizziness, palpitations  GI:  Per hpi Resp:  No shortness of breath with exertion or at rest. No excess mucus, no productive cough, No non-productive cough, No coughing up of blood.No change in color of mucus.No wheezing.No chest wall deformity  Skin:  no rash or lesions.  GU:  no dysuria, change in color of urine, no urgency or frequency. No flank pain.  Musculoskeletal:  No joint pain or swelling. No decreased range of motion. No back pain.  Psych:  No change in mood or affect. No depression or anxiety. No memory loss.   Past Medical History  Diagnosis Date  . Anxiety   . Hypothyroidism   . Hypertension   . Lumbar spondylosis 01/23/2011  . COPD (chronic obstructive pulmonary disease)     MIld  . Hyperlipidemia   . Fibromyalgia   . Gastritis   . Osteoporosis   . Stroke   . Headache(784.0)   . Apraxia due to stroke   . Aphasia due to stroke   .  GERD (gastroesophageal reflux disease)   . Tobacco abuse    Past Surgical History  Procedure Laterality Date  . Cholecystectomy    . Gastric bypass    . Esophagogastroduodenoscopy    . Knee surgery Left    Social History:  reports that she has been smoking Cigarettes.  She has a 60 pack-year smoking history. She does not have any smokeless tobacco history on file. She reports that she does not drink alcohol or use illicit drugs.  Allergies  Allergen Reactions  . Levaquin [Levofloxacin In D5w] Swelling    To mouth  . Macrobid [Nitrofurantoin Macrocrystal]     Patient can't remember reaction.    Family History  Problem Relation Age of Onset  . Heart attack Father      Prior to Admission medications   Medication Sig Start Date End Date Taking? Authorizing Provider  alendronate (FOSAMAX) 70 MG tablet TAKE 1 TABLET BY MOUTH WEEKLY Patient taking differently: Take 70 mg by mouth once a week. Takes on Mondays. 02/08/14  Yes Mikey Kirschner, MD  ALPRAZolam Duanne Moron) 1 MG tablet TAKE 1 TABLET BY MOUTH TWICE A DAY 01/15/14  Yes Mikey Kirschner, MD  aspirin 81 MG chewable tablet Chew 1 tablet (81 mg total) by mouth daily. 03/31/14  Yes Lezlie Octave Black, NP  HYDROcodone-acetaminophen (NORCO) 10-325 MG per tablet TAKE 1 TABLET FOUR TIMES DAILY AS NEEDED.MUST LAST 30 DAYS Patient taking differently: Take 1 tablet by mouth 4 (four) times daily as needed for moderate  pain. MUST LAST 30 DAYS. 03/08/14  Yes Mikey Kirschner, MD  hydrOXYzine (ATARAX/VISTARIL) 25 MG tablet TAKE 1 TABLET BY MOUTH EVERY 6 HOURS AS NEEDED FOR ITCHING 01/12/14  Yes Kathyrn Drown, MD  levothyroxine (SYNTHROID, LEVOTHROID) 112 MCG tablet TAKE 1 TABLET BY MOUTH EVERY DAY Patient taking differently: Take 112 mcg by mouth daily before breakfast.  02/08/14  Yes Mikey Kirschner, MD  lisinopril (PRINIVIL,ZESTRIL) 20 MG tablet Take 0.5 tablets (10 mg total) by mouth daily. 04/16/14  Yes Mikey Kirschner, MD  nortriptyline (PAMELOR) 50  MG capsule TAKE ONE CAPSULE AT BEDTIME Patient taking differently: Take 50 mg by mouth at bedtime.  02/08/14  Yes Mikey Kirschner, MD  pantoprazole (PROTONIX) 40 MG tablet TAKE 1 TABLET BY MOUTH EVERY DAY **STOP RANITIDINE Patient taking differently: Take 40 mg by mouth daily.  02/08/14  Yes Mikey Kirschner, MD  PARoxetine (PAXIL) 40 MG tablet TAKE 1 TABLET BY MOUTH EVERY DAY AS DIRECTED Patient taking differently: Take 40 mg by mouth daily.  02/08/14  Yes Mikey Kirschner, MD  polyethylene glycol powder (MIRALAX) powder Take 17 g by mouth daily.     Yes Historical Provider, MD  pravastatin (PRAVACHOL) 20 MG tablet TAKE 1 TABLET EVERY DAY Patient taking differently: Take 20 mg by mouth daily.  02/08/14  Yes Mikey Kirschner, MD  Misc Natural Products (OSTEO BI-FLEX ADV DOUBLE ST PO) Take 1 tablet by mouth daily.    Historical Provider, MD  Multiple Vitamin (MULTIVITAMIN WITH MINERALS) TABS Take 1 tablet by mouth daily.    Historical Provider, MD   Physical Exam: Filed Vitals:   04/26/14 1800 04/26/14 1815 04/26/14 1909 04/26/14 1930  BP: 70/41 84/54 97/62  99/48  Pulse:  80 76 79  Temp:   97.5 F (36.4 C)   TempSrc:   Oral   Resp: 19  17 18   SpO2:  97% 96% 96%    Wt Readings from Last 3 Encounters:  04/16/14 83.008 kg (183 lb)  03/29/14 83.008 kg (183 lb)  03/24/14 83.008 kg (183 lb)    General:  Appears calm and comfortable Eyes:  PERRL, normal lids, irises & conjunctiva ENT: dry mm,  Neck:  no LAD, masses or thyromegaly Cardiovascular:  RRR, no m/r/g. No LE edema. Telemetry:  SR, no arrhythmias  Respiratory:  CTA bilaterally, no w/r/r. Normal respiratory effort. Abdomen: hypoactive BS, soft, ntnd Skin:  no rash or induration seen on limited exam Musculoskeletal:  grossly normal tone BUE/BLE Psychiatric:  grossly normal mood and affect, speech fluent and appropriate Neurologic:  grossly non-focal.          Labs on Admission:  Basic Metabolic Panel:  Recent Labs Lab  04/26/14 1705  NA 138  K 4.8  CL 104  CO2 22  GLUCOSE 102*  BUN 21  CREATININE 1.31*  CALCIUM 7.8*   Liver Function Tests:  Recent Labs Lab 04/26/14 1705  AST 51*  ALT 27  ALKPHOS 103  BILITOT 0.6  PROT 4.7*  ALBUMIN 2.3*    Recent Labs Lab 04/26/14 1705  LIPASE 41   No results for input(s): AMMONIA in the last 168 hours. CBC:  Recent Labs Lab 04/26/14 1705  WBC 18.3*  NEUTROABS 16.5*  HGB 10.2*  HCT 30.1*  MCV 86.5  PLT 273   Cardiac Enzymes:  Recent Labs Lab 04/26/14 1705  TROPONINI <0.30    BNP (last 3 results) No results for input(s): PROBNP in the last 8760 hours. CBG: No  results for input(s): GLUCAP in the last 168 hours.  Radiological Exams on Admission: Dg Abd Acute W/chest  04/26/2014   CLINICAL DATA:  Diarrhea.  EXAM: ACUTE ABDOMEN SERIES (ABDOMEN 2 VIEW & CHEST 1 VIEW)  COMPARISON:  March 29, 2014.  FINDINGS: There is no evidence of dilated bowel loops or free intraperitoneal air. Status post cholecystectomy. Surgical clips are noted in the left upper quadrant pelvis is well. Phleboliths are noted in the pelvis. Heart size and mediastinal contours are within normal limits. Both lungs are clear.  IMPRESSION: No evidence of bowel obstruction or ileus. No acute cardiopulmonary abnormality seen.   Electronically Signed   By: Sabino Dick M.D.   On: 04/26/2014 18:30    EKG: Independently reviewed. NSR, RBBB, LAFB, no sign of ACS.   Assessment/Plan Principal Problem:   Diarrhea Active Problems:   Anxiety   Fibromyalgia   GERD (gastroesophageal reflux disease)   Hypothyroidism   Tobacco abuse   Depression   Anemia   Hypotension   AKI (acute kidney injury)   SIRS (systemic inflammatory response syndrome)  Diarrhea: acute onset voluminous diarrhea. Suspect viral etiology. No recent ABX and Cdiff negative. Lactic acid nml at 2 but WBC 18. Abdominal plain film nml. Lipase nml. Resp, HR nml and pt afebrile.  - Admit - step down -  Tele - GI pathogen panel pending - Stool O&P - Stool culture - continue rectal tube - Probiotic - NS 120ml/hr  - Consider ABX at any time if pt decompensates or cultures come back + for bacterial infection.    Hypotension: BP 60s/40s on admission and some improvement after 2L NS bolus in ED w/ improvement. Pt admitted 1 mo ago w/ nml pressure. Echo during admission was essentially nml. PCP visit on 12/4 w/ significant hypotension and Ace was cut in half. Current episode likely from severe fluid loss from diarrhea - hold lisinopril - 1L NS bolus - NS [id number]  Chronic diastolic heart failure: Echo 03/30/14 w/ EF 65-70% and Grade 1 diastolic dysfunction. No evidence of fluid overload.  - monitor fluid status closely given significant IVF hydration  AKI: Cr 1.3. Baseline 0.7. lilkely secondary to hypoperfusion from dehydration.  - BMET in the am - fluids as above  Anemia: Hgb 10.5, baseline 10. Normocytic. Likely from chronic disease - CBC in am  GERD: - continue protonix  Depressio/Anxiety: - continue home Xanax - Hydroxysine PRN sleep  Tobacco use: 1ppd - nicotine patch   Code Status: DNI DVT Prophylaxis: Hep Family Communication: None Disposition Plan: pending improvemetn - Stepdown  [place], [place] [facility] [email or web address] Password [email or web address]

## 2014-04-26 NOTE — ED Provider Notes (Signed)
CSN: [id number]     Arrival date & time 04/26/14  1547 History   First MD Initiated Contact with Patient 04/26/14 1550     Chief Complaint  Patient presents with  . Abdominal Pain     HPI  Pt was seen at 1555. Per pt, c/o gradual onset and persistence of multiple intermittent episodes of diarrhea that began yesterday.   Describes the stools as "watery." Has been associated with vague, generalized abd "pain." EMS states pt had 3 BM's while they were on scene, with pt's SBP "in the 60's." IVF bolus given while en route to the ED. Pt endorses she was seen by her PMD 1.5 weeks ago for hospital f/u and was told her "BP was low" at that time. Denies N/V, no CP/SOB, no back pain, no fevers, no black or blood in stools.    Past Medical History  Diagnosis Date  . Anxiety   . Hypothyroidism   . Hypertension   . Lumbar spondylosis 01/23/2011  . COPD (chronic obstructive pulmonary disease)     MIld  . Hyperlipidemia   . Fibromyalgia   . Gastritis   . Osteoporosis   . Stroke   . Headache(784.0)   . Apraxia due to stroke   . Aphasia due to stroke   . GERD (gastroesophageal reflux disease)   . Tobacco abuse    Past Surgical History  Procedure Laterality Date  . Cholecystectomy    . Gastric bypass    . Esophagogastroduodenoscopy    . Knee surgery Left    Family History  Problem Relation Age of Onset  . Heart attack Father    History  Substance Use Topics  . Smoking status: Light Tobacco Smoker -- 2.00 packs/day for 30 years    Types: Cigarettes  . Smokeless tobacco: Not on file     Comment: Smoking Now Every Once & Awhile Per Patient  . Alcohol Use: No    Review of Systems ROS: Statement: All systems negative except as marked or noted in the HPI; Constitutional: Negative for fever and chills. ; ; Eyes: Negative for eye pain, redness and discharge. ; ; ENMT: Negative for ear pain, hoarseness, nasal congestion, sinus pressure and sore throat. ; ; Cardiovascular: Negative for chest  pain, palpitations, diaphoresis, dyspnea and peripheral edema. ; ; Respiratory: Negative for cough, wheezing and stridor. ; ; Gastrointestinal: +diarrhea, abd pain. Negative for nausea, vomiting, blood in stool, hematemesis, jaundice and rectal bleeding. . ; ; Genitourinary: Negative for dysuria, flank pain and hematuria. ; ; Musculoskeletal: Negative for back pain and neck pain. Negative for swelling and trauma.; ; Skin: Negative for pruritus, rash, abrasions, blisters, bruising and skin lesion.; ; Neuro: Negative for headache, lightheadedness and neck stiffness. Negative for weakness, altered level of consciousness , altered mental status, extremity weakness, paresthesias, involuntary movement, seizure and syncope.     Allergies  Levaquin and Macrobid  Home Medications   Prior to Admission medications   Medication Sig Start Date End Date Taking? Authorizing Provider  alendronate (FOSAMAX) 70 MG tablet TAKE 1 TABLET BY MOUTH WEEKLY Patient taking differently: Take 70 mg by mouth once a week. Takes on Mondays. 02/08/14   Mikey Kirschner, MD  ALPRAZolam Duanne Moron) 1 MG tablet TAKE 1 TABLET BY MOUTH TWICE A DAY 01/15/14   Mikey Kirschner, MD  antipyrine-benzocaine Toniann Fail) otic solution Place 3-4 drops into both ears every 2 (two) hours as needed for ear pain.  01/25/14   Historical Provider, MD  aspirin 81  MG chewable tablet Chew 1 tablet (81 mg total) by mouth daily. 03/31/14   Radene Gunning, NP  HYDROcodone-acetaminophen ([place]) 10-325 MG per tablet TAKE 1 TABLET FOUR TIMES DAILY AS NEEDED.MUST LAST 30 DAYS Patient taking differently: Take 1 tablet by mouth 4 (four) times daily as needed for moderate pain. MUST LAST 30 DAYS. 03/08/14   Mikey Kirschner, MD  hydrOXYzine (ATARAX/VISTARIL) 25 MG tablet TAKE 1 TABLET BY MOUTH EVERY 6 HOURS AS NEEDED FOR ITCHING 01/12/14   Kathyrn Drown, MD  levothyroxine (SYNTHROID, LEVOTHROID) 112 MCG tablet TAKE 1 TABLET BY MOUTH EVERY DAY Patient taking differently:  Take 112 mcg by mouth daily before breakfast.  02/08/14   Mikey Kirschner, MD  lisinopril (PRINIVIL,ZESTRIL) 20 MG tablet Take 0.5 tablets (10 mg total) by mouth daily. 04/16/14   Mikey Kirschner, MD  Misc Natural Products (OSTEO BI-FLEX ADV DOUBLE ST PO) Take 1 tablet by mouth daily.    Historical Provider, MD  Multiple Vitamin (MULTIVITAMIN WITH MINERALS) TABS Take 1 tablet by mouth daily.    Historical Provider, MD  nortriptyline (PAMELOR) 50 MG capsule TAKE ONE CAPSULE AT BEDTIME Patient taking differently: Take 50 mg by mouth at bedtime.  02/08/14   Mikey Kirschner, MD  pantoprazole (PROTONIX) 40 MG tablet TAKE 1 TABLET BY MOUTH EVERY DAY **STOP RANITIDINE Patient taking differently: Take 40 mg by mouth daily.  02/08/14   Mikey Kirschner, MD  PARoxetine (PAXIL) 40 MG tablet TAKE 1 TABLET BY MOUTH EVERY DAY AS DIRECTED Patient taking differently: Take 40 mg by mouth daily.  02/08/14   Mikey Kirschner, MD  polyethylene glycol powder (MIRALAX) powder Take 17 g by mouth daily.      Historical Provider, MD  pravastatin (PRAVACHOL) 20 MG tablet TAKE 1 TABLET EVERY DAY Patient taking differently: Take 20 mg by mouth daily.  02/08/14   Mikey Kirschner, MD   BP 61/47 mmHg  Pulse 80  Temp(Src) 99 F (37.2 C) (Rectal)  Resp 20  SpO2 100%  Filed Vitals:   04/26/14 1700 04/26/14 1738 04/26/14 1800 04/26/14 1815  BP: 66/35 61/47 70/41  84/54  Pulse: 81 80  80  Temp:      TempSrc:      Resp: 16 20 19    SpO2: 97% 100%  97%   Filed Vitals:   04/26/14 1800 04/26/14 1815 04/26/14 1909 04/26/14 1930  BP: 70/41 84/54 97/62  99/48  Pulse:  80 76 79  Temp:   97.5 F (36.4 C)   TempSrc:   Oral   Resp: 19  17 18   SpO2:  97% 96% 96%     Physical Exam 1600; Physical examination:  Nursing notes reviewed; Vital signs and O2 SAT reviewed;  Constitutional: Well developed, Well nourished, In no acute distress; Head:  Normocephalic, atraumatic; Eyes: EOMI, PERRL, No scleral icterus; ENMT: Mouth and  pharynx normal, Mucous membranes dry; Neck: Supple, Full range of motion, No lymphadenopathy; Cardiovascular: Regular rate and rhythm, No gallop; Respiratory: Breath sounds clear & equal bilaterally, No wheezes.  Speaking full sentences with ease, Normal respiratory effort/excursion; Chest: Nontender, Movement normal; Abdomen: Soft, Nontender, Nondistended, Normal bowel sounds. +copious grey colored diarrhea, faintly heme positive.; Genitourinary: No CVA tenderness; Extremities: Pulses normal, No tenderness, No edema, No calf edema or asymmetry.; Neuro: AA&Ox3, vague historian. Major CN grossly intact.  Speech clear. No gross focal motor or sensory deficits in extremities.; Skin: Color normal, Warm, Dry.    ED Course  Procedures  EKG Interpretation   Date/Time:  Monday April 26 2014 16:43:43 EST Ventricular Rate:  83 PR Interval:  171 QRS Duration: 146 QT Interval:  419 QTC Calculation: 492 R Axis:   -88 Text Interpretation:  Sinus rhythm Left axis deviation RBBB and LAFB  Probable lateral infarct, old Nonspecific T wave abnormality Lateral leads  When compared with ECG of 03/30/2014 Nonspecific T wave abnormality  Lateral leads is now Present Confirmed by [facility]  MD, Nunzio Cory [phone])  on 04/26/2014 4:57:16 PM      MDM  MDM Reviewed: previous chart, nursing note and vitals Reviewed previous: labs and ECG Interpretation: labs, ECG and x-ray Total time providing critical care: 30-74 minutes. This excludes time spent performing separately reportable procedures and services. Consults: admitting MD     CRITICAL CARE Performed by: Alfonzo Feller Total critical care time: 45 Critical care time was exclusive of separately billable procedures and treating other patients. Critical care was necessary to treat or prevent imminent or life-threatening deterioration. Critical care was time spent personally by me on the following activities: development of treatment plan with  patient and/or surrogate as well as nursing, discussions with consultants, evaluation of patient's response to treatment, examination of patient, obtaining history from patient or surrogate, ordering and performing treatments and interventions, ordering and review of laboratory studies, ordering and review of radiographic studies, pulse oximetry and re-evaluation of patient's condition.  Results for orders placed or performed during the hospital encounter of 04/26/14  Clostridium Difficile by PCR  Result Value Ref Range   C difficile by pcr NEGATIVE NEGATIVE  Comprehensive metabolic panel  Result Value Ref Range   Sodium 138 137 - 147 mEq/L   Potassium 4.8 3.7 - 5.3 mEq/L   Chloride 104 96 - 112 mEq/L   CO2 22 19 - 32 mEq/L   Glucose, Bld 102 (H) 70 - 99 mg/dL   BUN 21 6 - 23 mg/dL   Creatinine, Ser 1.31 (H) 0.50 - 1.10 mg/dL   Calcium 7.8 (L) 8.4 - 10.5 mg/dL   Total Protein 4.7 (L) 6.0 - 8.3 g/dL   Albumin 2.3 (L) 3.5 - 5.2 g/dL   AST 51 (H) 0 - 37 U/L   ALT 27 0 - 35 U/L   Alkaline Phosphatase 103 39 - 117 U/L   Total Bilirubin 0.6 0.3 - 1.2 mg/dL   GFR calc non Af Amer 39 (L) >90 mL/min   GFR calc Af Amer 46 (L) >90 mL/min   Anion gap 12 5 - 15  Lipase, blood  Result Value Ref Range   Lipase 41 11 - 59 U/L  CBC with Differential  Result Value Ref Range   WBC 18.3 (H) 4.0 - 10.5 K/uL   RBC 3.48 (L) 3.87 - 5.11 MIL/uL   Hemoglobin 10.2 (L) 12.0 - 15.0 g/dL   HCT 30.1 (L) 36.0 - 46.0 %   MCV 86.5 78.0 - 100.0 fL   MCH 29.3 26.0 - 34.0 pg   MCHC 33.9 30.0 - 36.0 g/dL   RDW 20.8 (H) 11.5 - 15.5 %   Platelets 273 150 - 400 K/uL   Neutrophils Relative % 89 (H) 43 - 77 %   Neutro Abs 16.5 (H) 1.7 - 7.7 K/uL   Lymphocytes Relative 7 (L) 12 - 46 %   Lymphs Abs 1.2 0.7 - 4.0 K/uL   Monocytes Relative 3 3 - 12 %   Monocytes Absolute 0.5 0.1 - 1.0 K/uL   Eosinophils Relative 1 0 - 5 %  Eosinophils Absolute 0.1 0.0 - 0.7 K/uL   Basophils Relative 0 0 - 1 %   Basophils Absolute  0.0 0.0 - 0.1 K/uL  Troponin I  Result Value Ref Range   Troponin I <0.30 <0.30 ng/mL  Lactic acid, plasma  Result Value Ref Range   Lactic Acid, Venous 2.0 0.5 - 2.2 mmol/L  POC occult blood, ED  Result Value Ref Range   Fecal Occult Bld POSITIVE (A) NEGATIVE    Dg Abd Acute W/chest 04/26/2014   CLINICAL DATA:  Diarrhea.  EXAM: ACUTE ABDOMEN SERIES (ABDOMEN 2 VIEW & CHEST 1 VIEW)  COMPARISON:  March 29, 2014.  FINDINGS: There is no evidence of dilated bowel loops or free intraperitoneal air. Status post cholecystectomy. Surgical clips are noted in the left upper quadrant pelvis is well. Phleboliths are noted in the pelvis. Heart size and mediastinal contours are within normal limits. Both lungs are clear.  IMPRESSION: No evidence of bowel obstruction or ileus. No acute cardiopulmonary abnormality seen.   Electronically Signed   By: Sabino Dick M.D.   On: 04/26/2014 18:30    1845:   EPIC chart reviewed: pt was seen by her PMD on 04/16/14 with SBP 90, was told to decrease her lisinopril dose.  Pt has received IVF x2 L NS with increase in her BP, remains NSR on monitor (not tachycardic). Has has copious grey colored watery stools while in the ED. Pt remains awake/alert and conversive with ED staff, NAD, resps easy, abd remains benign. GI pathogen panel pending. H/H per baseline. T/C to [facility]. Marily Memos, case discussed, including:  HPI, pertinent PM/SHx, VS/PE, dx testing, ED course and treatment:  Agreeable to admit, requests to write temporary orders, obtain stepdown bed to team APAdmits.      Francine Graven, DO 04/28/14 [phone]

## 2014-04-27 ENCOUNTER — Inpatient Hospital Stay (HOSPITAL_COMMUNITY): Payer: Medicare Other

## 2014-04-27 ENCOUNTER — Encounter (HOSPITAL_COMMUNITY): Payer: Self-pay | Admitting: Radiology

## 2014-04-27 LAB — COMPREHENSIVE METABOLIC PANEL
ALK PHOS: 94 U/L (ref 39–117)
ALT: 25 U/L (ref 0–35)
AST: 43 U/L — AB (ref 0–37)
Albumin: 2 g/dL — ABNORMAL LOW (ref 3.5–5.2)
Anion gap: 15 (ref 5–15)
BILIRUBIN TOTAL: 0.4 mg/dL (ref 0.3–1.2)
BUN: 16 mg/dL (ref 6–23)
CHLORIDE: 111 meq/L (ref 96–112)
CO2: 15 meq/L — AB (ref 19–32)
Calcium: 6.9 mg/dL — ABNORMAL LOW (ref 8.4–10.5)
Creatinine, Ser: 0.82 mg/dL (ref 0.50–1.10)
GFR calc Af Amer: 80 mL/min — ABNORMAL LOW (ref >90)
GFR, EST NON AFRICAN AMERICAN: 69 mL/min — AB (ref >90)
Glucose, Bld: 77 mg/dL (ref 70–99)
Potassium: 4.6 mEq/L (ref 3.7–5.3)
SODIUM: 141 meq/L (ref 137–147)
Total Protein: 4.4 g/dL — ABNORMAL LOW (ref 6.0–8.3)

## 2014-04-27 LAB — CBC
HCT: 32.8 % — ABNORMAL LOW (ref 36.0–46.0)
Hemoglobin: 10.6 g/dL — ABNORMAL LOW (ref 12.0–15.0)
MCH: 28.7 pg (ref 26.0–34.0)
MCHC: 32.3 g/dL (ref 30.0–36.0)
MCV: 88.9 fL (ref 78.0–100.0)
Platelets: 265 10*3/uL (ref 150–400)
RBC: 3.69 MIL/uL — AB (ref 3.87–5.11)
RDW: 21.3 % — AB (ref 11.5–15.5)
WBC: 22.3 10*3/uL — AB (ref 4.0–10.5)

## 2014-04-27 MED ORDER — METRONIDAZOLE IN NACL 5-0.79 MG/ML-% IV SOLN
500.0000 mg | Freq: Three times a day (TID) | INTRAVENOUS | Status: DC
  Administered 2014-04-27 – 2014-04-30 (×10): 500 mg via INTRAVENOUS
  Filled 2014-04-27 (×10): qty 100

## 2014-04-27 MED ORDER — BOOST HIGH PROTEIN PO LIQD
237.0000 mL | Freq: Three times a day (TID) | ORAL | Status: DC
  Administered 2014-04-27 – 2014-04-30 (×8): 237 mL via ORAL
  Filled 2014-04-27 (×18): qty 237

## 2014-04-27 MED ORDER — IOHEXOL 300 MG/ML  SOLN
100.0000 mL | Freq: Once | INTRAMUSCULAR | Status: AC | PRN
  Administered 2014-04-27: 100 mL via INTRAVENOUS

## 2014-04-27 MED ORDER — DEXTROSE 5 % IV SOLN
1.0000 g | Freq: Three times a day (TID) | INTRAVENOUS | Status: DC
  Administered 2014-04-27 – 2014-04-30 (×10): 1 g via INTRAVENOUS
  Filled 2014-04-27 (×20): qty 1

## 2014-04-27 NOTE — Care Management Note (Addendum)
    Page 1 of 2   04/30/2014     9:01:31 AM CARE MANAGEMENT NOTE 04/30/2014  Patient:  [name], [name]   Account Number:  [id number]  Date Initiated:  04/27/2014  Documentation initiated by:  Jolene Provost  Subjective/Objective Assessment:   Pt is from home with self care, lives with son. Pt has no HH services or medication needs prior to admission. Pt has cane, walker/rollator, BSC and shower chair at home.     Action/Plan:   Pt plans to discharge home with self care. No CM needs identified. Will continue to follow.   Anticipated DC Date:  04/29/2014   Anticipated DC Plan:  [place]  CM consult      [facility] Choice  HOME HEALTH   Choice offered to / List presented to:  C-1 Patient        [place] arranged  HH-1 RN  [place].   Status of service:  Completed, signed off Medicare Important Message given?  YES (If response is "NO", the following Medicare IM given date fields will be blank) Date Medicare IM given:  04/30/2014 Medicare IM given by:  Jolene Provost Date Additional Medicare IM given:   Additional Medicare IM given by:    Discharge Disposition:  [place]  Per UR Regulation:  Reviewed for med. necessity/level of care/duration of stay  If discussed at [place] of Stay Meetings, dates discussed:    Comments:  04/29/2014 0900 Jolene Provost, RN, MSN, PCCN Pt wants to go home with [facility]. Pt's son in room during conversation. Vaughan Basta of [facility] notified and order placed for resumption of serivces. Pt plans to discharge home over weekend. No further CM needs at this time.  04/28/2014 [place], RN, MSN, PCCN Pt says HH was supposed to come see her (ordered through her PCP's office) but had not had a chance to visit before her admission to the hospital. Dr Lance Sell office says [facility] was referred. Will resume [place] services at discharge. Will  contineue to follow for CM needs.  04/27/2014 [place], RN, MSN, [facility]

## 2014-04-27 NOTE — Care Management Utilization Note (Signed)
UR complete 

## 2014-04-27 NOTE — Consult Note (Signed)
Referring Provider: Dr. Darrick Meigs Primary Care Physician:  Rubbie Battiest, MD Primary Gastroenterologist:  Dr. Oneida Alar   Date of Admission: 04/26/14 Date of Consultation: 04/27/14  Reason for Consultation: Colitis  HPI:  [name] is a 73 year old female admitted with multiple episodes of watery, non-bloody diarrhea. CT revealed diffuse wall thickening of the colon, most pronounced in descending and sigmoid colon, compatible with colitis. Notes history of chronic, intermittent loose stools, going several days without any issues then followed by diarrhea. This episode with too numerous to count watery stools. Vague lower abdominal discomfort prior to admission. Pill dysphagia but no solid food dysphagia. Denies sick contacts. States she has not had exposure to antibiotics. Inpatient Nov 2015 secondary to chest pain. Last colonoscopy normal in 2006 by Dr. Laural Golden.   Past Medical History  Diagnosis Date  . Anxiety   . Hypothyroidism   . Hypertension   . Lumbar spondylosis 01/23/2011  . COPD (chronic obstructive pulmonary disease)     MIld  . Hyperlipidemia   . Fibromyalgia   . Gastritis   . Osteoporosis   . Stroke   . Headache(784.0)   . Apraxia due to stroke   . Aphasia due to stroke   . GERD (gastroesophageal reflux disease)   . Tobacco abuse     Past Surgical History  Procedure Laterality Date  . Cholecystectomy    . Gastric bypass      open  . Esophagogastroduodenoscopy    . Knee surgery Left     Prior to Admission medications   Medication Sig Start Date End Date Taking? Authorizing Provider  alendronate (FOSAMAX) 70 MG tablet TAKE 1 TABLET BY MOUTH WEEKLY Patient taking differently: Take 70 mg by mouth once a week. Takes on Mondays. 02/08/14  Yes Mikey Kirschner, MD  ALPRAZolam Duanne Moron) 1 MG tablet TAKE 1 TABLET BY MOUTH TWICE A DAY 01/15/14  Yes Mikey Kirschner, MD  aspirin 81 MG chewable tablet Chew 1 tablet (81 mg total) by mouth daily. 03/31/14  Yes Lezlie Octave  Black, NP  HYDROcodone-acetaminophen (NORCO) 10-325 MG per tablet TAKE 1 TABLET FOUR TIMES DAILY AS NEEDED.MUST LAST 30 DAYS Patient taking differently: Take 1 tablet by mouth 4 (four) times daily as needed for moderate pain. MUST LAST 30 DAYS. 03/08/14  Yes Mikey Kirschner, MD  hydrOXYzine (ATARAX/VISTARIL) 25 MG tablet TAKE 1 TABLET BY MOUTH EVERY 6 HOURS AS NEEDED FOR ITCHING 01/12/14  Yes Kathyrn Drown, MD  levothyroxine (SYNTHROID, LEVOTHROID) 112 MCG tablet TAKE 1 TABLET BY MOUTH EVERY DAY Patient taking differently: Take 112 mcg by mouth daily before breakfast.  02/08/14  Yes Mikey Kirschner, MD  lisinopril (PRINIVIL,ZESTRIL) 20 MG tablet Take 0.5 tablets (10 mg total) by mouth daily. 04/16/14  Yes Mikey Kirschner, MD  nortriptyline (PAMELOR) 50 MG capsule TAKE ONE CAPSULE AT BEDTIME Patient taking differently: Take 50 mg by mouth at bedtime.  02/08/14  Yes Mikey Kirschner, MD  pantoprazole (PROTONIX) 40 MG tablet TAKE 1 TABLET BY MOUTH EVERY DAY **STOP RANITIDINE Patient taking differently: Take 40 mg by mouth daily.  02/08/14  Yes Mikey Kirschner, MD  PARoxetine (PAXIL) 40 MG tablet TAKE 1 TABLET BY MOUTH EVERY DAY AS DIRECTED Patient taking differently: Take 40 mg by mouth daily.  02/08/14  Yes Mikey Kirschner, MD  polyethylene glycol powder (MIRALAX) powder Take 17 g by mouth daily.     Yes Historical Provider, MD  pravastatin (PRAVACHOL) 20 MG  tablet TAKE 1 TABLET EVERY DAY Patient taking differently: Take 20 mg by mouth daily.  02/08/14  Yes Mikey Kirschner, MD  Misc Natural Products (OSTEO BI-FLEX ADV DOUBLE ST PO) Take 1 tablet by mouth daily.    Historical Provider, MD  Multiple Vitamin (MULTIVITAMIN WITH MINERALS) TABS Take 1 tablet by mouth daily.    Historical Provider, MD    Current Facility-Administered Medications  Medication Dose Route Frequency Provider Last Rate Last Dose  . 0.9 %  sodium chloride infusion   Intravenous Continuous Oswald Hillock, MD 100 mL/hr at 04/27/14  1700    . acetaminophen (TYLENOL) tablet 650 mg  650 mg Oral Q6H PRN Waldemar Dickens, MD       Or  . acetaminophen (TYLENOL) suppository 650 mg  650 mg Rectal Q6H PRN Waldemar Dickens, MD      . acidophilus (RISAQUAD) capsule 1 capsule  1 capsule Oral Daily Waldemar Dickens, MD   1 capsule at 04/27/14 1008  . ALPRAZolam Duanne Moron) tablet 1 mg  1 mg Oral BID Waldemar Dickens, MD   1 mg at 04/27/14 1008  . antiseptic oral rinse (CPC / CETYLPYRIDINIUM CHLORIDE 0.05%) solution 7 mL  7 mL Mouth Rinse BID Waldemar Dickens, MD   7 mL at 04/27/14 1000  . aspirin chewable tablet 81 mg  81 mg Oral Daily Waldemar Dickens, MD   81 mg at 04/27/14 1008  . ceFEPIme (MAXIPIME) 1 g in dextrose 5 % 50 mL IVPB  1 g Intravenous Q8H Lavonia Drafts Lilliston, RPH   1 g at 04/27/14 1700  . feeding supplement (BOOST HIGH PROTEIN) liquid 237 mL  237 mL Oral TID BM Danie Binder, MD      . HYDROcodone-acetaminophen [facility]) 10-325 MG per tablet 1 tablet  1 tablet Oral QID PRN Waldemar Dickens, MD   1 tablet at 04/27/14 1253  . hydrOXYzine (ATARAX/VISTARIL) tablet 25 mg  25 mg Oral Q6H PRN Waldemar Dickens, MD      . levothyroxine (SYNTHROID, LEVOTHROID) tablet 112 mcg  112 mcg Oral QAC breakfast Waldemar Dickens, MD   112 mcg at 04/27/14 0900  . metroNIDAZOLE (FLAGYL) IVPB 500 mg  500 mg Intravenous Q8H Oswald Hillock, MD   500 mg at 04/27/14 1500  . nicotine (NICODERM CQ - dosed in mg/24 hours) patch 21 mg  21 mg Transdermal Daily Waldemar Dickens, MD   21 mg at 04/27/14 1008  . ondansetron (ZOFRAN) tablet 4 mg  4 mg Oral Q6H PRN Waldemar Dickens, MD       Or  . ondansetron [facility]) injection 4 mg  4 mg Intravenous Q6H PRN Waldemar Dickens, MD      . pantoprazole (PROTONIX) EC tablet 40 mg  40 mg Oral Daily Waldemar Dickens, MD   40 mg at 04/27/14 1008  . PARoxetine (PAXIL) tablet 40 mg  40 mg Oral Daily Waldemar Dickens, MD   40 mg at 04/27/14 1020    Allergies as of 04/26/2014 - Review Complete 04/26/2014  Allergen Reaction Noted   . Levaquin [levofloxacin in d5w] Swelling 08/11/2012  . Macrobid [nitrofurantoin macrocrystal]  08/11/2012    Family History  Problem Relation Age of Onset  . Heart attack Father   . Colon cancer Brother     History   Social History  . Marital Status: Married    Spouse Name: [name]    Number of Children: [name]  .  Years of Education: [name]   Occupational History  . Not on file.   Social History Main Topics  . Smoking status: Light Tobacco Smoker -- 30 years    Types: Cigarettes  . Smokeless tobacco: Not on file     Comment: Smoking Now Every Once & Awhile Per Patient  . Alcohol Use: No  . Drug Use: No  . Sexual Activity: Not Currently   Other Topics Concern  . Not on file   Social History Narrative    Review of Systems: As mentioned in HPI.   Physical Exam: Vital signs in last 24 hours: Temp:  [97.5 F (36.4 C)-97.9 F (36.6 C)] 97.7 F (36.5 C) (12/15 1156) Pulse Rate:  [28-107] 86 (12/15 1400) Resp:  [9-23] 14 (12/15 1600) BP: (61-114)/(28-98) 99/42 mmHg (12/15 1600) SpO2:  [74 %-100 %] 98 % (12/15 1400) Weight:  [176 lb 12.9 oz (80.2 kg)] 176 lb 12.9 oz (80.2 kg) (12/15 0500) Last BM Date: 04/27/14 General:   Alert and oriented. Expressive aphasia at times with unrelated responses occasionally Head:  Normocephalic and atraumatic. Eyes:  Sclera clear, no icterus.   Conjunctiva pink. Ears:  Normal auditory acuity. Nose:  No deformity, discharge,  or lesions. Mouth:  No deformity or lesions Lungs:  Clear throughout to auscultation.   No wheezes, crackles, or rhonchi. No acute distress. Heart:  S1 S2 present Abdomen:  Soft, obese, non-tender, non-distended. Large panniculus.  Rectal:  Rectal tube in place with loose dark brown/green stool   Neurologic:  Alert and  oriented x4 Skin:  Intact without significant lesions or rashes. Psych:  Alert and cooperative. Normal mood and affect.  Intake/Output from previous day: 12/14 0701 - 12/15 0700 In: 3661.3  [I.V.:3661.3] Out: 1700 [Urine:700; Stool:1000] Intake/Output this shift: Total I/O In: 1706.7 [I.V.:1406.7; IV Piggyback:300] Out: -   Lab Results:  Recent Labs  04/26/14 1705 04/27/14 0500  WBC 18.3* 22.3*  HGB 10.2* 10.6*  HCT 30.1* 32.8*  PLT 273 265   BMET  Recent Labs  04/26/14 1705 04/27/14 0500  NA 138 141  K 4.8 4.6  CL 104 111  CO2 22 15*  GLUCOSE 102* 77  BUN 21 16  CREATININE 1.31* 0.82  CALCIUM 7.8* 6.9*   LFT  Recent Labs  04/26/14 1705 04/27/14 0500  PROT 4.7* 4.4*  ALBUMIN 2.3* 2.0*  AST 51* 43*  ALT 27 25  ALKPHOS 103 94  BILITOT 0.6 0.4   Cdiff PCR negative GI pathogen panel pending  Studies/Results: Ct Abdomen Pelvis W Contrast  04/27/2014   CLINICAL DATA:  Patient with abdominal pain and diarrhea.  EXAM: CT ABDOMEN AND PELVIS WITH CONTRAST  TECHNIQUE: Multidetector CT imaging of the abdomen and pelvis was performed using the standard protocol following bolus administration of intravenous contrast.  CONTRAST:  171mL OMNIPAQUE IOHEXOL 300 MG/ML  SOLN  COMPARISON:  CT 04/28/2012; 01/21/2011.  FINDINGS: Visualization of the lower thorax demonstrates small bilateral pleural effusions. Consolidative opacity within the dependent right lower lobe, nonspecific, potentially representing atelectasis. Infection could have a similar appearance.  The liver is normal in size and contour without focal hepatic lesion identified. Stable mild intrahepatic and extrahepatic biliary ductal dilatation. Status post cholecystectomy. Spleen is unremarkable. Normal bilateral adrenal glands. Kidneys enhance symmetrically with contrast. No hydronephrosis. Urinary bladder is unremarkable.  Infrarenal abdominal aortic ectasia measuring 2.2 cm. Scattered calcified atherosclerotic plaque involving the abdominal aorta and iliac vessels.  Rectal tube is in place. There is perirectal fat stranding and a small  amount of fluid within the pelvis. The colon is decompressed, limiting  evaluation however there is fairly diffuse colonic wall thickening, mostly involving the descending and sigmoid colon. Oral contrast material within the small bowel. No evidence for small bowel obstruction. Mildly prominent small-bowel loops within the left upper quadrant. No free fluid or free intraperitoneal air. Stable postsurgical changes involving the stomach.  No aggressive or acute appearing osseous lesions. Lower lumbar spine degenerative change. Left lateral chest wall lipoma.  IMPRESSION: Diffuse wall thickening of the colon, most pronounced involving the descending and sigmoid colon, compatible with colitis. Considerations include infectious, inflammatory or ischemic etiologies. Consider correlation with colonoscopy after resolution of the acute symptomatology to exclude underlying mass.  Prominence of the small bowel loops within the left upper quadrant may represent associated ileus.  Small bilateral pleural effusions. Consolidative opacity within the right lower lobe may represent atelectasis. Infection is not excluded. Recommend radiographic followup to ensure resolution.  Unchanged mild intrahepatic and extrahepatic biliary ductal dilatation status post cholecystectomy.  These results will be called to the ordering clinician or representative by the Radiologist Assistant, and communication documented in the PACS or zVision Dashboard.   Electronically Signed   By: Lovey Newcomer M.D.   On: 04/27/2014 13:14   US Venous Img Lower Bilateral  04/27/2014   CLINICAL DATA:  Left lower extremity edema and right lower extremity pain.  EXAM: BILATERAL LOWER EXTREMITY VENOUS DOPPLER ULTRASOUND  TECHNIQUE: Gray-scale sonography with graded compression, as well as color Doppler and duplex ultrasound were performed to evaluate the lower extremity deep venous systems from the level of the common femoral vein and including the common femoral, femoral, profunda femoral, popliteal and calf veins including the  posterior tibial, peroneal and gastrocnemius veins when visible. The superficial great saphenous vein was also interrogated. Spectral Doppler was utilized to evaluate flow at rest and with distal augmentation maneuvers in the common femoral, femoral and popliteal veins.  COMPARISON:  None.  FINDINGS: RIGHT LOWER EXTREMITY  Common Femoral Vein: No evidence of thrombus. Normal compressibility, respiratory phasicity and response to augmentation.  Saphenofemoral Junction: No evidence of thrombus. Normal compressibility and flow on color Doppler imaging.  Profunda Femoral Vein: No evidence of thrombus. Normal compressibility and flow on color Doppler imaging.  Femoral Vein: No evidence of thrombus. Normal compressibility, respiratory phasicity and response to augmentation.  Popliteal Vein: No evidence of thrombus. Normal compressibility, respiratory phasicity and response to augmentation.  Calf Veins: No evidence of thrombus. Normal compressibility and flow on color Doppler imaging.  Superficial Great Saphenous Vein: No evidence of thrombus. Normal compressibility and flow on color Doppler imaging.  Venous Reflux:  None.  Other Findings: No evidence of superficial thrombophlebitis or abnormal fluid collection.  LEFT LOWER EXTREMITY  Common Femoral Vein: No evidence of thrombus. Normal compressibility, respiratory phasicity and response to augmentation.  Saphenofemoral Junction: No evidence of thrombus. Normal compressibility and flow on color Doppler imaging.  Profunda Femoral Vein: No evidence of thrombus. Normal compressibility and flow on color Doppler imaging.  Femoral Vein: No evidence of thrombus. Normal compressibility, respiratory phasicity and response to augmentation.  Popliteal Vein: No evidence of thrombus. Normal compressibility, respiratory phasicity and response to augmentation.  Calf Veins: No evidence of thrombus. Normal compressibility and flow on color Doppler imaging.  Superficial Great Saphenous  Vein: No evidence of thrombus. Normal compressibility and flow on color Doppler imaging.  Venous Reflux:  None.  Other Findings: No evidence of superficial thrombophlebitis or abnormal fluid collection.  IMPRESSION:  No evidence of bilateral lower extremity deep venous thrombosis.   Electronically Signed   By: Aletta Edouard M.D.   On: 04/27/2014 10:32   Dg Abd Acute W/chest  04/26/2014   CLINICAL DATA:  Diarrhea.  EXAM: ACUTE ABDOMEN SERIES (ABDOMEN 2 VIEW & CHEST 1 VIEW)  COMPARISON:  March 29, 2014.  FINDINGS: There is no evidence of dilated bowel loops or free intraperitoneal air. Status post cholecystectomy. Surgical clips are noted in the left upper quadrant pelvis is well. Phleboliths are noted in the pelvis. Heart size and mediastinal contours are within normal limits. Both lungs are clear.  IMPRESSION: No evidence of bowel obstruction or ileus. No acute cardiopulmonary abnormality seen.   Electronically Signed   By: Sabino Dick M.D.   On: 04/26/2014 18:30    Impression: 73 year old female admitted with acute on chronic diarrhea with negative Cdiff PCR on file; GI pathogen panel remains pending. With increasing white count, hypoalbuminemia, and profuse watery stool, concern remains for false-negative Cdiff PCR. Currently antibiotics include Flagyl, and cefepime was added secondary to concerns for intra-abdominal infection. Will follow-up on pending GI pathogen panel. If concern remains for Cdiff, would add Vancomycin oral QID and minimize any other unnecessary antibiotics. Will ultimately need colonoscopy as an outpatient for above average risk screening due to family history of colon cancer.   Plan: Follow-up on pending GI pathogen panel Recheck CBC in am; if increasing leukocytosis, consider adding Vanc to Flagyl regimen if concern for Cdiff  Full liquids Probiotic Will reassess patient's clinical status in am  Orvil Feil, ANP-BC [facility]   Addendum: Discussed  with Dr. Oneida Alar. If no improvement during hospitalization, will need flexible sigmoidoscopy for further evaluation; otherwise, outpatient colonoscopy.    LOS: 1 day    04/27/2014, 4:48 PM

## 2014-04-27 NOTE — Progress Notes (Signed)
ANTIBIOTIC CONSULT NOTE  Pharmacy Consult for Cefepime Indication: intra-abdominal infection  Allergies  Allergen Reactions  . Levaquin [Levofloxacin In D5w] Swelling    To mouth  . Macrobid [Nitrofurantoin Macrocrystal]     Patient can't remember reaction.    Patient Measurements: Height: 5\' 2"  (157.5 cm) Weight: 176 lb 12.9 oz (80.2 kg) IBW/kg (Calculated) : 50.1  Vital Signs: Temp: 97.7 F (36.5 C) (12/15 1156) Temp Source: Oral (12/15 1156) BP: 101/72 mmHg (12/15 1100) Pulse Rate: 107 (12/15 1100) Intake/Output from previous day: 12/14 0701 - 12/15 0700 In: 3661.3 [I.V.:3661.3] Out: 1700 [Urine:700; Stool:1000] Intake/Output from this shift: Total I/O In: 750 [I.V.:600; IV Piggyback:150] Out: -   Labs:  Recent Labs  04/26/14 1705 04/27/14 0500  WBC 18.3* 22.3*  HGB 10.2* 10.6*  PLT 273 265  CREATININE 1.31* 0.82   Estimated Creatinine Clearance: 59.9 mL/min (by C-G formula based on Cr of 0.82). No results for input(s): VANCOTROUGH, VANCOPEAK, VANCORANDOM, GENTTROUGH, GENTPEAK, GENTRANDOM, TOBRATROUGH, TOBRAPEAK, TOBRARND, AMIKACINPEAK, AMIKACINTROU, AMIKACIN in the last 72 hours.   Microbiology: Recent Results (from the past 720 hour(s))  Clostridium Difficile by PCR     Status: None   Collection Time: 04/26/14  4:10 PM  Result Value Ref Range Status   C difficile by pcr NEGATIVE NEGATIVE Final  MRSA PCR Screening     Status: None   Collection Time: 04/26/14  8:40 PM  Result Value Ref Range Status   MRSA by PCR NEGATIVE NEGATIVE Final    Comment:        The GeneXpert MRSA Assay (FDA approved for NASAL specimens only), is one component of a comprehensive MRSA colonization surveillance program. It is not intended to diagnose MRSA infection nor to guide or monitor treatment for MRSA infections.     Anti-infectives    Start     Dose/Rate Route Frequency Ordered Stop   04/27/14 1000  ceFEPIme (MAXIPIME) 1 g in dextrose 5 % 50 mL IVPB     1 g100  mL/hr over 30 Minutes Intravenous Every 8 hours 04/27/14 0931     04/27/14 0800  metroNIDAZOLE (FLAGYL) IVPB 500 mg     500 mg100 mL/hr over 60 Minutes Intravenous Every 8 hours 04/27/14 0740        Assessment: 32 yoF admitted with diarrhea.  Cdiff negative.  WBC continues to rise. Cefepime added for possible intra-abdominal infection.  Renal function at patient's baseline.   Cefepime 12/15>> Flagyl 12/15>>  Goal of Therapy:  Eradicate infection.  Plan:  Cefepime 1gm IV q8h Continue Flagyl per MD Monitor renal function and cx data  *Consider change Cefepime & Flagyl to Zosyn if decide enterococcal coverage needed  Biagio Borg 04/27/2014,12:08 PM

## 2014-04-27 NOTE — Plan of Care (Signed)
Problem: Consults Goal: General Medical Patient Education See Patient Education Module for specific education. Outcome: Progressing Admitted with severe diarrhea and dehydration with hypotension, 4 liters of NS given at this time with IVF running at 150cc/hr.  Patient remains hypotensive at 84/40.  Alert and pleasant, answers all questions appropriately.  Had to give pain and anxiety medication at bedtime per patient request. Rated abdominal pain an 8/10. Goal: Skin Care Protocol Initiated - if Braden Score 18 or less If consults are not indicated, leave blank or document [name] Outcome: Progressing No skin breakdown small bruise of right lower abdomen.  Patient recently here receiving heparin injections per patient  Problem: Phase I Progression Outcomes Goal: Pain controlled with appropriate interventions Outcome: Completed/Met Date Met:  04/27/14 vicodin 10/325 and 0.47m ativan given for abdominal pain, patient resting well, arouses easily and answers appropriately Goal: OOB as tolerated unless otherwise ordered Outcome: Not Progressing Will remain on bedrest for tonight Goal: Initial discharge plan identified [place]: [place] Goal: Voiding-avoid urinary catheter unless indicated Outcome: Progressing Patient has remained dehydrated and has not voided since admission Goal: Hemodynamically stable Outcome: Not Progressing Patient remains dehydrated and hypotensive

## 2014-04-27 NOTE — Progress Notes (Signed)
Patient with acute urinary retention, has attempted to void x2 on bedpan, then with flexiseal in place we thought to get on [facility] for urethra placement, unable to void, Bladder scan showed 441 cc.   Order for in and out cath. Patient tolerated procedure well, peri care given prior to sterile procedure. 700 cc return on in and out. Patient remains alert, pleasant and cooperative and hard of hearing.

## 2014-04-27 NOTE — Progress Notes (Signed)
[facility] PROGRESS NOTE  [name] HKG:[id number] DOB: August 14, 1940 DOA: 04/26/2014 PCP: Rubbie Battiest, MD  Assessment/Plan: 1. *Sepsis- Patient is hypotensive, has worsening leukocytosis. She has perfuse diarrhea, will obtain CT scan abdomen pelvis to rule out colitis. We'll start the patient on cefepime and Flagyl empirically. C. difficile PCR was negative. Patient is mentally alert and oriented 3, will continue aggressive IV hydration. She is a partial code. 2. Diarrhea- ? Cause, obtain CT abdomen pelvis with contrast. C. difficile PCR negative, stool culture and ova and parasites are pending. Continue flaxseal. Will obtain GI consultation. 3. Chronic diastolic heart failure- patient's echo from 03/30/2014 showed EF 24-81% grade 1 diastolic dysfunction. She is getting aggressive IV hydration, will have to be careful with the fluids consider cutting down the fluids once the patient improves. 4. Anemia- hemoglobin is 10.5, continue to monitor H&H. Stool for occult blood is positive.  5. Depression/anxiety- continue Xanax and hydroxyzine when necessary sleep. 6. DVT prophylaxis- SCDs, will discontinue heparin as patient has positive occult blood  Code Status: Partial code Family Communication: *No family at bedside Disposition Plan: Home when stable   Consultants:  None  Procedures:  None  Antibiotics:  *None  HPI/Subjective: 73 y.o. female started feeling weak and dizzy yesterday. Diarrhea started today. When asked how many episodes, pt states "Too many to count." watery and non-bloody. Mild intermittent lower abd campy sensation. Has not tried anythign to make it better. Associated w/ nausea but no emesis. Denies eating raw or potentially spoiled foods, sick contacts, fevers, recent travel Saw PCP on 12/4/ for hypotension and lisinopril dose decreased from 20mg  to 10mg .   Patient continues to be hypotensive. WBC has gone up. C diff pcr was negative, but has profuse  diarrhea. Flexiseal in place.  Objective: Filed Vitals:   04/27/14 1400  BP: 111/98  Pulse: 86  Temp:   Resp: 17    Intake/Output Summary (Last 24 hours) at 04/27/14 1450 Last data filed at 04/27/14 1400  Gross per 24 hour  Intake 4861.25 ml  Output   1700 ml  Net 3161.25 ml   Filed Weights   04/26/14 2030 04/26/14 2055 04/27/14 0500  Weight: 80.2 kg (176 lb 12.9 oz) 80.2 kg (176 lb 12.9 oz) 80.2 kg (176 lb 12.9 oz)    Exam:  Physical Exam: Eyes: No icterus, extraocular muscles intact  Lungs: Normal respiratory effort, bilateral clear to auscultation, no crackles or wheezes.  Heart: Regular rate and rhythm, S1 and S2 normal, no murmurs, rubs auscultated Abdomen: BS normoactive,soft,nondistended,non-tender to palpation,no organomegaly Extremities: No pretibial edema, no erythema, no cyanosis, no clubbing Neuro : Alert and oriented to time, place and person, No focal deficits Skin: No rashes seen on exam  Data Reviewed: Basic Metabolic Panel:  Recent Labs Lab 04/26/14 1705 04/27/14 0500  NA 138 141  K 4.8 4.6  CL 104 111  CO2 22 15*  GLUCOSE 102* 77  BUN 21 16  CREATININE 1.31* 0.82  CALCIUM 7.8* 6.9*   Liver Function Tests:  Recent Labs Lab 04/26/14 1705 04/27/14 0500  AST 51* 43*  ALT 27 25  ALKPHOS 103 94  BILITOT 0.6 0.4  PROT 4.7* 4.4*  ALBUMIN 2.3* 2.0*    Recent Labs Lab 04/26/14 1705  LIPASE 41   No results for input(s): AMMONIA in the last 168 hours. CBC:  Recent Labs Lab 04/26/14 1705 04/27/14 0500  WBC 18.3* 22.3*  NEUTROABS 16.5*  --   HGB 10.2* 10.6*  HCT 30.1* 32.8*  MCV 86.5 88.9  PLT 273 265   Cardiac Enzymes:  Recent Labs Lab 04/26/14 1705  TROPONINI <0.30   BNP (last 3 results) No results for input(s): PROBNP in the last 8760 hours. CBG: No results for input(s): GLUCAP in the last 168 hours.  Recent Results (from the past 240 hour(s))  Clostridium Difficile by PCR     Status: None   Collection Time:  04/26/14  4:10 PM  Result Value Ref Range Status   C difficile by pcr NEGATIVE NEGATIVE Final  MRSA PCR Screening     Status: None   Collection Time: 04/26/14  8:40 PM  Result Value Ref Range Status   MRSA by PCR NEGATIVE NEGATIVE Final    Comment:        The GeneXpert MRSA Assay (FDA approved for NASAL specimens only), is one component of a comprehensive MRSA colonization surveillance program. It is not intended to diagnose MRSA infection nor to guide or monitor treatment for MRSA infections.      Studies: Ct Abdomen Pelvis W Contrast  04/27/2014   CLINICAL DATA:  Patient with abdominal pain and diarrhea.  EXAM: CT ABDOMEN AND PELVIS WITH CONTRAST  TECHNIQUE: Multidetector CT imaging of the abdomen and pelvis was performed using the standard protocol following bolus administration of intravenous contrast.  CONTRAST:  167mL OMNIPAQUE IOHEXOL 300 MG/ML  SOLN  COMPARISON:  CT 04/28/2012; 01/21/2011.  FINDINGS: Visualization of the lower thorax demonstrates small bilateral pleural effusions. Consolidative opacity within the dependent right lower lobe, nonspecific, potentially representing atelectasis. Infection could have a similar appearance.  The liver is normal in size and contour without focal hepatic lesion identified. Stable mild intrahepatic and extrahepatic biliary ductal dilatation. Status post cholecystectomy. Spleen is unremarkable. Normal bilateral adrenal glands. Kidneys enhance symmetrically with contrast. No hydronephrosis. Urinary bladder is unremarkable.  Infrarenal abdominal aortic ectasia measuring 2.2 cm. Scattered calcified atherosclerotic plaque involving the abdominal aorta and iliac vessels.  Rectal tube is in place. There is perirectal fat stranding and a small amount of fluid within the pelvis. The colon is decompressed, limiting evaluation however there is fairly diffuse colonic wall thickening, mostly involving the descending and sigmoid colon. Oral contrast material  within the small bowel. No evidence for small bowel obstruction. Mildly prominent small-bowel loops within the left upper quadrant. No free fluid or free intraperitoneal air. Stable postsurgical changes involving the stomach.  No aggressive or acute appearing osseous lesions. Lower lumbar spine degenerative change. Left lateral chest wall lipoma.  IMPRESSION: Diffuse wall thickening of the colon, most pronounced involving the descending and sigmoid colon, compatible with colitis. Considerations include infectious, inflammatory or ischemic etiologies. Consider correlation with colonoscopy after resolution of the acute symptomatology to exclude underlying mass.  Prominence of the small bowel loops within the left upper quadrant may represent associated ileus.  Small bilateral pleural effusions. Consolidative opacity within the right lower lobe may represent atelectasis. Infection is not excluded. Recommend radiographic followup to ensure resolution.  Unchanged mild intrahepatic and extrahepatic biliary ductal dilatation status post cholecystectomy.  These results will be called to the ordering clinician or representative by the Radiologist Assistant, and communication documented in the PACS or zVision Dashboard.   Electronically Signed   By: Lovey Newcomer M.D.   On: 04/27/2014 13:14   US Venous Img Lower Bilateral  04/27/2014   CLINICAL DATA:  Left lower extremity edema and right lower extremity pain.  EXAM: BILATERAL LOWER EXTREMITY VENOUS DOPPLER ULTRASOUND  TECHNIQUE: Gray-scale sonography with graded compression, as  well as color Doppler and duplex ultrasound were performed to evaluate the lower extremity deep venous systems from the level of the common femoral vein and including the common femoral, femoral, profunda femoral, popliteal and calf veins including the posterior tibial, peroneal and gastrocnemius veins when visible. The superficial great saphenous vein was also interrogated. Spectral Doppler was  utilized to evaluate flow at rest and with distal augmentation maneuvers in the common femoral, femoral and popliteal veins.  COMPARISON:  None.  FINDINGS: RIGHT LOWER EXTREMITY  Common Femoral Vein: No evidence of thrombus. Normal compressibility, respiratory phasicity and response to augmentation.  Saphenofemoral Junction: No evidence of thrombus. Normal compressibility and flow on color Doppler imaging.  Profunda Femoral Vein: No evidence of thrombus. Normal compressibility and flow on color Doppler imaging.  Femoral Vein: No evidence of thrombus. Normal compressibility, respiratory phasicity and response to augmentation.  Popliteal Vein: No evidence of thrombus. Normal compressibility, respiratory phasicity and response to augmentation.  Calf Veins: No evidence of thrombus. Normal compressibility and flow on color Doppler imaging.  Superficial Great Saphenous Vein: No evidence of thrombus. Normal compressibility and flow on color Doppler imaging.  Venous Reflux:  None.  Other Findings: No evidence of superficial thrombophlebitis or abnormal fluid collection.  LEFT LOWER EXTREMITY  Common Femoral Vein: No evidence of thrombus. Normal compressibility, respiratory phasicity and response to augmentation.  Saphenofemoral Junction: No evidence of thrombus. Normal compressibility and flow on color Doppler imaging.  Profunda Femoral Vein: No evidence of thrombus. Normal compressibility and flow on color Doppler imaging.  Femoral Vein: No evidence of thrombus. Normal compressibility, respiratory phasicity and response to augmentation.  Popliteal Vein: No evidence of thrombus. Normal compressibility, respiratory phasicity and response to augmentation.  Calf Veins: No evidence of thrombus. Normal compressibility and flow on color Doppler imaging.  Superficial Great Saphenous Vein: No evidence of thrombus. Normal compressibility and flow on color Doppler imaging.  Venous Reflux:  None.  Other Findings: No evidence of  superficial thrombophlebitis or abnormal fluid collection.  IMPRESSION: No evidence of bilateral lower extremity deep venous thrombosis.   Electronically Signed   By: Aletta Edouard M.D.   On: 04/27/2014 10:32   Dg Abd Acute W/chest  04/26/2014   CLINICAL DATA:  Diarrhea.  EXAM: ACUTE ABDOMEN SERIES (ABDOMEN 2 VIEW & CHEST 1 VIEW)  COMPARISON:  March 29, 2014.  FINDINGS: There is no evidence of dilated bowel loops or free intraperitoneal air. Status post cholecystectomy. Surgical clips are noted in the left upper quadrant pelvis is well. Phleboliths are noted in the pelvis. Heart size and mediastinal contours are within normal limits. Both lungs are clear.  IMPRESSION: No evidence of bowel obstruction or ileus. No acute cardiopulmonary abnormality seen.   Electronically Signed   By: Sabino Dick M.D.   On: 04/26/2014 18:30    Scheduled Meds: . acidophilus  1 capsule Oral Daily  . ALPRAZolam  1 mg Oral BID  . antiseptic oral rinse  7 mL Mouth Rinse BID  . aspirin  81 mg Oral Daily  . ceFEPime (MAXIPIME) IV  1 g Intravenous Q8H  . heparin  5,000 Units Subcutaneous 3 times per day  . levothyroxine  112 mcg Oral QAC breakfast  . metronidazole  500 mg Intravenous Q8H  . nicotine  21 mg Transdermal Daily  . pantoprazole  40 mg Oral Daily  . PARoxetine  40 mg Oral Daily   Continuous Infusions: . sodium chloride 150 mL/hr at 04/27/14 1500    Principal Problem:  Diarrhea Active Problems:   Anxiety   Fibromyalgia   GERD (gastroesophageal reflux disease)   Hypothyroidism   Tobacco abuse   Depression   Anemia   Hypotension   AKI (acute kidney injury)   SIRS (systemic inflammatory response syndrome)   Anemia of chronic disease   Dehydration   Arterial hypotension    Time spent: 25 min    [facility] Pager [phone]. If 7PM-7AM, please contact night-coverage at [email or web address], password [facility] 04/27/2014, 2:50 PM  LOS: 1 day

## 2014-04-28 DIAGNOSIS — D508 Other iron deficiency anemias: Secondary | ICD-10-CM

## 2014-04-28 LAB — URINE CULTURE
CULTURE: NO GROWTH
Colony Count: NO GROWTH

## 2014-04-28 LAB — COMPREHENSIVE METABOLIC PANEL
ALT: 19 U/L (ref 0–35)
AST: 17 U/L (ref 0–37)
Albumin: 1.7 g/dL — ABNORMAL LOW (ref 3.5–5.2)
Alkaline Phosphatase: 80 U/L (ref 39–117)
Anion gap: 10 (ref 5–15)
BUN: 9 mg/dL (ref 6–23)
CO2: 18 mEq/L — ABNORMAL LOW (ref 19–32)
Calcium: 6.2 mg/dL — CL (ref 8.4–10.5)
Chloride: 111 mEq/L (ref 96–112)
Creatinine, Ser: 0.59 mg/dL (ref 0.50–1.10)
GFR, EST NON AFRICAN AMERICAN: 89 mL/min — AB (ref >90)
Glucose, Bld: 81 mg/dL (ref 70–99)
POTASSIUM: 3.4 meq/L — AB (ref 3.7–5.3)
SODIUM: 139 meq/L (ref 137–147)
Total Bilirubin: 0.2 mg/dL — ABNORMAL LOW (ref 0.3–1.2)
Total Protein: 3.6 g/dL — ABNORMAL LOW (ref 6.0–8.3)

## 2014-04-28 LAB — CBC
HCT: 27 % — ABNORMAL LOW (ref 36.0–46.0)
Hemoglobin: 9 g/dL — ABNORMAL LOW (ref 12.0–15.0)
MCH: 29.3 pg (ref 26.0–34.0)
MCHC: 33.3 g/dL (ref 30.0–36.0)
MCV: 87.9 fL (ref 78.0–100.0)
Platelets: 241 10*3/uL (ref 150–400)
RBC: 3.07 MIL/uL — AB (ref 3.87–5.11)
RDW: 21.8 % — ABNORMAL HIGH (ref 11.5–15.5)
WBC: 15.4 10*3/uL — ABNORMAL HIGH (ref 4.0–10.5)

## 2014-04-28 LAB — CORTISOL: CORTISOL PLASMA: 16.1 ug/dL

## 2014-04-28 MED ORDER — SODIUM CHLORIDE 0.9 % IV BOLUS (SEPSIS)
1000.0000 mL | Freq: Once | INTRAVENOUS | Status: AC
  Administered 2014-04-28: 1000 mL via INTRAVENOUS

## 2014-04-28 MED ORDER — SODIUM CHLORIDE 0.9 % IV BOLUS (SEPSIS)
500.0000 mL | Freq: Once | INTRAVENOUS | Status: AC
  Administered 2014-04-28: 500 mL via INTRAVENOUS

## 2014-04-28 MED ORDER — SODIUM CHLORIDE 0.9 % IV SOLN
INTRAVENOUS | Status: DC
  Administered 2014-04-28 – 2014-04-29 (×2): via INTRAVENOUS

## 2014-04-28 NOTE — Progress Notes (Signed)
Subjective: Complains of headache, lower abdominal discomfort intermittently. No N/V. Rectal tube in place with last documented output of 1 liter on 12/14. Now bag with 200 ml, unclear if this is since 12/14.   Objective: Vital signs in last 24 hours: Temp:  [97.7 F (36.5 C)-98.5 F (36.9 C)] 98.2 F (36.8 C) (12/15 2345) Pulse Rate:  [68-107] 80 (12/16 0700) Resp:  [13-20] 13 (12/16 0700) BP: (86-114)/(40-98) 90/43 mmHg (12/16 0700) SpO2:  [81 %-100 %] 98 % (12/16 0700) Last BM Date: 04/27/14 General:   Alert and oriented, pleasant Head:  Normocephalic and atraumatic. Eyes:  No icterus, sclera clear. Conjuctiva pink.  Abdomen:  Bowel sounds present, soft, obese, mild TTP RLQ but without rebound or guarding.  Extremities:  With right pedal edema greater than left, duplex ultrasounds on file Neurologic:  Alert and  oriented to person, place, situation   Intake/Output from previous day: 12/15 0701 - 12/16 0700 In: 3183.3 [P.O.:120; I.V.:2613.3; IV Piggyback:450] Out: 650 [Urine:650] Intake/Output this shift:    Lab Results:  Recent Labs  04/26/14 1705 04/27/14 0500 04/28/14 0534  WBC 18.3* 22.3* 15.4*  HGB 10.2* 10.6* 9.0*  HCT 30.1* 32.8* 27.0*  PLT 273 265 241   BMET  Recent Labs  04/26/14 1705 04/27/14 0500 04/28/14 0534  NA 138 141 139  K 4.8 4.6 3.4*  CL 104 111 111  CO2 22 15* 18*  GLUCOSE 102* 77 81  BUN 21 16 9   CREATININE 1.31* 0.82 0.59  CALCIUM 7.8* 6.9* 6.2*   LFT  Recent Labs  04/26/14 1705 04/27/14 0500 04/28/14 0534  PROT 4.7* 4.4* 3.6*  ALBUMIN 2.3* 2.0* 1.7*  AST 51* 43* 17  ALT 27 25 19   ALKPHOS 103 94 80  BILITOT 0.6 0.4 0.2*    Lab Results  Component Value Date   IRON 39* 03/30/2014   TIBC 354 03/30/2014   FERRITIN 8* 03/31/2014    Studies/Results: Ct Abdomen Pelvis W Contrast  04/27/2014   CLINICAL DATA:  Patient with abdominal pain and diarrhea.  EXAM: CT ABDOMEN AND PELVIS WITH CONTRAST  TECHNIQUE:  Multidetector CT imaging of the abdomen and pelvis was performed using the standard protocol following bolus administration of intravenous contrast.  CONTRAST:  133mL OMNIPAQUE IOHEXOL 300 MG/ML  SOLN  COMPARISON:  CT 04/28/2012; 01/21/2011.  FINDINGS: Visualization of the lower thorax demonstrates small bilateral pleural effusions. Consolidative opacity within the dependent right lower lobe, nonspecific, potentially representing atelectasis. Infection could have a similar appearance.  The liver is normal in size and contour without focal hepatic lesion identified. Stable mild intrahepatic and extrahepatic biliary ductal dilatation. Status post cholecystectomy. Spleen is unremarkable. Normal bilateral adrenal glands. Kidneys enhance symmetrically with contrast. No hydronephrosis. Urinary bladder is unremarkable.  Infrarenal abdominal aortic ectasia measuring 2.2 cm. Scattered calcified atherosclerotic plaque involving the abdominal aorta and iliac vessels.  Rectal tube is in place. There is perirectal fat stranding and a small amount of fluid within the pelvis. The colon is decompressed, limiting evaluation however there is fairly diffuse colonic wall thickening, mostly involving the descending and sigmoid colon. Oral contrast material within the small bowel. No evidence for small bowel obstruction. Mildly prominent small-bowel loops within the left upper quadrant. No free fluid or free intraperitoneal air. Stable postsurgical changes involving the stomach.  No aggressive or acute appearing osseous lesions. Lower lumbar spine degenerative change. Left lateral chest wall lipoma.  IMPRESSION: Diffuse wall thickening of the colon, most pronounced involving the descending and sigmoid  colon, compatible with colitis. Considerations include infectious, inflammatory or ischemic etiologies. Consider correlation with colonoscopy after resolution of the acute symptomatology to exclude underlying mass.  Prominence of the small  bowel loops within the left upper quadrant may represent associated ileus.  Small bilateral pleural effusions. Consolidative opacity within the right lower lobe may represent atelectasis. Infection is not excluded. Recommend radiographic followup to ensure resolution.  Unchanged mild intrahepatic and extrahepatic biliary ductal dilatation status post cholecystectomy.  These results will be called to the ordering clinician or representative by the Radiologist Assistant, and communication documented in the PACS or zVision Dashboard.   Electronically Signed   By: Lovey Newcomer M.D.   On: 04/27/2014 13:14   US Venous Img Lower Bilateral  04/27/2014   CLINICAL DATA:  Left lower extremity edema and right lower extremity pain.  EXAM: BILATERAL LOWER EXTREMITY VENOUS DOPPLER ULTRASOUND  TECHNIQUE: Gray-scale sonography with graded compression, as well as color Doppler and duplex ultrasound were performed to evaluate the lower extremity deep venous systems from the level of the common femoral vein and including the common femoral, femoral, profunda femoral, popliteal and calf veins including the posterior tibial, peroneal and gastrocnemius veins when visible. The superficial great saphenous vein was also interrogated. Spectral Doppler was utilized to evaluate flow at rest and with distal augmentation maneuvers in the common femoral, femoral and popliteal veins.  COMPARISON:  None.  FINDINGS: RIGHT LOWER EXTREMITY  Common Femoral Vein: No evidence of thrombus. Normal compressibility, respiratory phasicity and response to augmentation.  Saphenofemoral Junction: No evidence of thrombus. Normal compressibility and flow on color Doppler imaging.  Profunda Femoral Vein: No evidence of thrombus. Normal compressibility and flow on color Doppler imaging.  Femoral Vein: No evidence of thrombus. Normal compressibility, respiratory phasicity and response to augmentation.  Popliteal Vein: No evidence of thrombus. Normal  compressibility, respiratory phasicity and response to augmentation.  Calf Veins: No evidence of thrombus. Normal compressibility and flow on color Doppler imaging.  Superficial Great Saphenous Vein: No evidence of thrombus. Normal compressibility and flow on color Doppler imaging.  Venous Reflux:  None.  Other Findings: No evidence of superficial thrombophlebitis or abnormal fluid collection.  LEFT LOWER EXTREMITY  Common Femoral Vein: No evidence of thrombus. Normal compressibility, respiratory phasicity and response to augmentation.  Saphenofemoral Junction: No evidence of thrombus. Normal compressibility and flow on color Doppler imaging.  Profunda Femoral Vein: No evidence of thrombus. Normal compressibility and flow on color Doppler imaging.  Femoral Vein: No evidence of thrombus. Normal compressibility, respiratory phasicity and response to augmentation.  Popliteal Vein: No evidence of thrombus. Normal compressibility, respiratory phasicity and response to augmentation.  Calf Veins: No evidence of thrombus. Normal compressibility and flow on color Doppler imaging.  Superficial Great Saphenous Vein: No evidence of thrombus. Normal compressibility and flow on color Doppler imaging.  Venous Reflux:  None.  Other Findings: No evidence of superficial thrombophlebitis or abnormal fluid collection.  IMPRESSION: No evidence of bilateral lower extremity deep venous thrombosis.   Electronically Signed   By: Aletta Edouard M.D.   On: 04/27/2014 10:32   Dg Abd Acute W/chest  04/26/2014   CLINICAL DATA:  Diarrhea.  EXAM: ACUTE ABDOMEN SERIES (ABDOMEN 2 VIEW & CHEST 1 VIEW)  COMPARISON:  March 29, 2014.  FINDINGS: There is no evidence of dilated bowel loops or free intraperitoneal air. Status post cholecystectomy. Surgical clips are noted in the left upper quadrant pelvis is well. Phleboliths are noted in the pelvis. Heart size and mediastinal  contours are within normal limits. Both lungs are clear.  IMPRESSION: No  evidence of bowel obstruction or ileus. No acute cardiopulmonary abnormality seen.   Electronically Signed   By: Sabino Dick M.D.   On: 04/26/2014 18:30    Assessment: 73 year old female admitted with profuse, watery, maladorous diarrhea with CT findings of diffuse wall thickening of the colon but most pronounced in descending and sigmoid colon, compatible with colitis. Cdiff PCR negative, and GI pathogen panel is still pending. Dealing with infectious process clinically, with low concern for ischemic etiology or new-onset IBD. Appears stool output has decreased since admission, but stricter I/Os needed with documentation each shift. Discussed with nursing. Encouragingly, she has had improvement in leukocytosis since admission, with Flagyl empirically and Cefepime added per pharmacy consult. Will follow-up on GI pathogen panel as available and continue current regimen with close monitoring of clinical status and laboratory findings. If no improvement or decline in status, needs flex sig. Otherwise, outpatient colonoscopy indicated once acute illness resolved. As of note, +FH of colon cancer in brother.   Anemia: s/p gastric bypass. IDA. Malabsorptive likely due to post-surgical changes. Colonoscopy still indicated with possible EGD as outpatient.   Plan: Continue Flagyl and Cefepime Follow-up on pending GI pathogen panel Strict I/Os Full liquids, advance as tolerated to low residue Probiotic daily Plans for outpatient colonoscopy +/- EGD, but flex sig as inpatient if no improvement or worsening in clinical status.    Orvil Feil, ANP-BC [facility]    LOS: 2 days    04/28/2014, 7:59 AM  Attending note. Seen in ICU bed 11. Discuss with multiple accompanying family members. Patient does not appear toxic at this time.  According to nursing staff, has a same 200 mL in the bag that she had this a.m. without apparent significant diarrhea today.  We'll reassess tomorrow morning  and decide about a sigmoidoscopy. GI pathogen panel remains pending.

## 2014-04-28 NOTE — Progress Notes (Signed)
Pt resting in bed with eyes closed. MD paged about patients blood pressure running 80/50's 70/40's Heart rate 70's. Fluid bolus ordered. Will continue to monitor.

## 2014-04-28 NOTE — Progress Notes (Signed)
[facility] MD updated on alert calcium=6.2

## 2014-04-28 NOTE — Progress Notes (Signed)
[facility] PROGRESS NOTE  [name] NAT:[id number] DOB: 01/12/1941 DOA: 04/26/2014 PCP: Rubbie Battiest, MD  Assessment/Plan:  Principal Problem:   Diarrhea - Currently undergoing work up and GI pathogen panel pending - GI on board - Agree with continuing current IV antibiotic regimen  Active Problems:   Anxiety - Stable patient on Xanax    Fibromyalgia - Continue supportive therapy, stable    GERD (gastroesophageal reflux disease) - Stable on Protonix    Hypothyroidism - Stable on current Synthroid regimen    Tobacco abuse - And tinea recommend cessation    Depression - Stable continue Paxil    Hypotension - Most likely from diarrhea - Fluid boluses PRN, if no improvement plan will be to consult intensivist for further recommendations.   Code Status: Partial Family Communication: None at bedside Disposition Plan:    Consultants:  GI  Procedures:  None  Antibiotics:  Cefepime and metronidazole  HPI/Subjective: Pt has no new complaints. Does report that she dose not feel much better today.  Objective: Filed Vitals:   04/28/14 1000  BP: 87/41  Pulse: 82  Temp:   Resp: 14    Intake/Output Summary (Last 24 hours) at 04/28/14 1036 Last data filed at 04/28/14 0600  Gross per 24 hour  Intake 2633.34 ml  Output    650 ml  Net 1983.34 ml   Filed Weights   04/26/14 2030 04/26/14 2055 04/27/14 0500  Weight: 80.2 kg (176 lb 12.9 oz) 80.2 kg (176 lb 12.9 oz) 80.2 kg (176 lb 12.9 oz)    Exam:   General:  Pt in nad, alert and awake  Cardiovascular: rrr, no mrg  Respiratory: cta bl, no wheezes  Abdomen: soft, NT, ND  Musculoskeletal: no cyanosis or clubbing   Data Reviewed: Basic Metabolic Panel:  Recent Labs Lab 04/26/14 1705 04/27/14 0500 04/28/14 0534  NA 138 141 139  K 4.8 4.6 3.4*  CL 104 111 111  CO2 22 15* 18*  GLUCOSE 102* 77 81  BUN 21 16 9   CREATININE 1.31* 0.82 0.59  CALCIUM 7.8* 6.9* 6.2*   Liver  Function Tests:  Recent Labs Lab 04/26/14 1705 04/27/14 0500 04/28/14 0534  AST 51* 43* 17  ALT 27 25 19   ALKPHOS 103 94 80  BILITOT 0.6 0.4 0.2*  PROT 4.7* 4.4* 3.6*  ALBUMIN 2.3* 2.0* 1.7*    Recent Labs Lab 04/26/14 1705  LIPASE 41   No results for input(s): AMMONIA in the last 168 hours. CBC:  Recent Labs Lab 04/26/14 1705 04/27/14 0500 04/28/14 0534  WBC 18.3* 22.3* 15.4*  NEUTROABS 16.5*  --   --   HGB 10.2* 10.6* 9.0*  HCT 30.1* 32.8* 27.0*  MCV 86.5 88.9 87.9  PLT 273 265 241   Cardiac Enzymes:  Recent Labs Lab 04/26/14 1705  TROPONINI <0.30   BNP (last 3 results) No results for input(s): PROBNP in the last 8760 hours. CBG: No results for input(s): GLUCAP in the last 168 hours.  Recent Results (from the past 240 hour(s))  Clostridium Difficile by PCR     Status: None   Collection Time: 04/26/14  4:10 PM  Result Value Ref Range Status   C difficile by pcr NEGATIVE NEGATIVE Final  MRSA PCR Screening     Status: None   Collection Time: 04/26/14  8:40 PM  Result Value Ref Range Status   MRSA by PCR NEGATIVE NEGATIVE Final    Comment:        The GeneXpert MRSA  Assay (FDA approved for NASAL specimens only), is one component of a comprehensive MRSA colonization surveillance program. It is not intended to diagnose MRSA infection nor to guide or monitor treatment for MRSA infections.      Studies: Ct Abdomen Pelvis W Contrast  04/27/2014   CLINICAL DATA:  Patient with abdominal pain and diarrhea.  EXAM: CT ABDOMEN AND PELVIS WITH CONTRAST  TECHNIQUE: Multidetector CT imaging of the abdomen and pelvis was performed using the standard protocol following bolus administration of intravenous contrast.  CONTRAST:  141mL OMNIPAQUE IOHEXOL 300 MG/ML  SOLN  COMPARISON:  CT 04/28/2012; 01/21/2011.  FINDINGS: Visualization of the lower thorax demonstrates small bilateral pleural effusions. Consolidative opacity within the dependent right lower lobe,  nonspecific, potentially representing atelectasis. Infection could have a similar appearance.  The liver is normal in size and contour without focal hepatic lesion identified. Stable mild intrahepatic and extrahepatic biliary ductal dilatation. Status post cholecystectomy. Spleen is unremarkable. Normal bilateral adrenal glands. Kidneys enhance symmetrically with contrast. No hydronephrosis. Urinary bladder is unremarkable.  Infrarenal abdominal aortic ectasia measuring 2.2 cm. Scattered calcified atherosclerotic plaque involving the abdominal aorta and iliac vessels.  Rectal tube is in place. There is perirectal fat stranding and a small amount of fluid within the pelvis. The colon is decompressed, limiting evaluation however there is fairly diffuse colonic wall thickening, mostly involving the descending and sigmoid colon. Oral contrast material within the small bowel. No evidence for small bowel obstruction. Mildly prominent small-bowel loops within the left upper quadrant. No free fluid or free intraperitoneal air. Stable postsurgical changes involving the stomach.  No aggressive or acute appearing osseous lesions. Lower lumbar spine degenerative change. Left lateral chest wall lipoma.  IMPRESSION: Diffuse wall thickening of the colon, most pronounced involving the descending and sigmoid colon, compatible with colitis. Considerations include infectious, inflammatory or ischemic etiologies. Consider correlation with colonoscopy after resolution of the acute symptomatology to exclude underlying mass.  Prominence of the small bowel loops within the left upper quadrant may represent associated ileus.  Small bilateral pleural effusions. Consolidative opacity within the right lower lobe may represent atelectasis. Infection is not excluded. Recommend radiographic followup to ensure resolution.  Unchanged mild intrahepatic and extrahepatic biliary ductal dilatation status post cholecystectomy.  These results will be  called to the ordering clinician or representative by the Radiologist Assistant, and communication documented in the PACS or zVision Dashboard.   Electronically Signed   By: Lovey Newcomer M.D.   On: 04/27/2014 13:14   US Venous Img Lower Bilateral  04/27/2014   CLINICAL DATA:  Left lower extremity edema and right lower extremity pain.  EXAM: BILATERAL LOWER EXTREMITY VENOUS DOPPLER ULTRASOUND  TECHNIQUE: Gray-scale sonography with graded compression, as well as color Doppler and duplex ultrasound were performed to evaluate the lower extremity deep venous systems from the level of the common femoral vein and including the common femoral, femoral, profunda femoral, popliteal and calf veins including the posterior tibial, peroneal and gastrocnemius veins when visible. The superficial great saphenous vein was also interrogated. Spectral Doppler was utilized to evaluate flow at rest and with distal augmentation maneuvers in the common femoral, femoral and popliteal veins.  COMPARISON:  None.  FINDINGS: RIGHT LOWER EXTREMITY  Common Femoral Vein: No evidence of thrombus. Normal compressibility, respiratory phasicity and response to augmentation.  Saphenofemoral Junction: No evidence of thrombus. Normal compressibility and flow on color Doppler imaging.  Profunda Femoral Vein: No evidence of thrombus. Normal compressibility and flow on color Doppler imaging.  Femoral  Vein: No evidence of thrombus. Normal compressibility, respiratory phasicity and response to augmentation.  Popliteal Vein: No evidence of thrombus. Normal compressibility, respiratory phasicity and response to augmentation.  Calf Veins: No evidence of thrombus. Normal compressibility and flow on color Doppler imaging.  Superficial Great Saphenous Vein: No evidence of thrombus. Normal compressibility and flow on color Doppler imaging.  Venous Reflux:  None.  Other Findings: No evidence of superficial thrombophlebitis or abnormal fluid collection.  LEFT LOWER  EXTREMITY  Common Femoral Vein: No evidence of thrombus. Normal compressibility, respiratory phasicity and response to augmentation.  Saphenofemoral Junction: No evidence of thrombus. Normal compressibility and flow on color Doppler imaging.  Profunda Femoral Vein: No evidence of thrombus. Normal compressibility and flow on color Doppler imaging.  Femoral Vein: No evidence of thrombus. Normal compressibility, respiratory phasicity and response to augmentation.  Popliteal Vein: No evidence of thrombus. Normal compressibility, respiratory phasicity and response to augmentation.  Calf Veins: No evidence of thrombus. Normal compressibility and flow on color Doppler imaging.  Superficial Great Saphenous Vein: No evidence of thrombus. Normal compressibility and flow on color Doppler imaging.  Venous Reflux:  None.  Other Findings: No evidence of superficial thrombophlebitis or abnormal fluid collection.  IMPRESSION: No evidence of bilateral lower extremity deep venous thrombosis.   Electronically Signed   By: Aletta Edouard M.D.   On: 04/27/2014 10:32   Dg Abd Acute W/chest  04/26/2014   CLINICAL DATA:  Diarrhea.  EXAM: ACUTE ABDOMEN SERIES (ABDOMEN 2 VIEW & CHEST 1 VIEW)  COMPARISON:  March 29, 2014.  FINDINGS: There is no evidence of dilated bowel loops or free intraperitoneal air. Status post cholecystectomy. Surgical clips are noted in the left upper quadrant pelvis is well. Phleboliths are noted in the pelvis. Heart size and mediastinal contours are within normal limits. Both lungs are clear.  IMPRESSION: No evidence of bowel obstruction or ileus. No acute cardiopulmonary abnormality seen.   Electronically Signed   By: Sabino Dick M.D.   On: 04/26/2014 18:30    Scheduled Meds: . acidophilus  1 capsule Oral Daily  . ALPRAZolam  1 mg Oral BID  . antiseptic oral rinse  7 mL Mouth Rinse BID  . aspirin  81 mg Oral Daily  . ceFEPime (MAXIPIME) IV  1 g Intravenous Q8H  . feeding supplement  237 mL Oral TID  BM  . levothyroxine  112 mcg Oral QAC breakfast  . metronidazole  500 mg Intravenous Q8H  . nicotine  21 mg Transdermal Daily  . pantoprazole  40 mg Oral Daily  . PARoxetine  40 mg Oral Daily   Continuous Infusions: . sodium chloride 100 mL/hr at 04/28/14 0600     Time spent: > 35 minutes    Velvet Bathe  [facility] Pager [phone]. If 7PM-7AM, please contact night-coverage at [email or web address], password [facility] 04/28/2014, 10:36 AM  LOS: 2 days

## 2014-04-29 ENCOUNTER — Encounter (HOSPITAL_COMMUNITY): Payer: Self-pay | Admitting: Gastroenterology

## 2014-04-29 DIAGNOSIS — E038 Other specified hypothyroidism: Secondary | ICD-10-CM

## 2014-04-29 DIAGNOSIS — M797 Fibromyalgia: Secondary | ICD-10-CM

## 2014-04-29 DIAGNOSIS — K529 Noninfective gastroenteritis and colitis, unspecified: Secondary | ICD-10-CM | POA: Insufficient documentation

## 2014-04-29 MED ORDER — OXYCODONE HCL 5 MG PO TABS
5.0000 mg | ORAL_TABLET | Freq: Once | ORAL | Status: AC
  Administered 2014-04-29: 5 mg via ORAL
  Filled 2014-04-29: qty 1

## 2014-04-29 MED ORDER — SODIUM CHLORIDE 0.9 % IV BOLUS (SEPSIS)
500.0000 mL | Freq: Once | INTRAVENOUS | Status: AC
  Administered 2014-04-29: 500 mL via INTRAVENOUS

## 2014-04-29 NOTE — Progress Notes (Signed)
Pt resting in bed with eyes closed. Vss. No complaints of any distress. Report given to C.Morris, Therapist, sports. Pt to be transferred to room 305.

## 2014-04-29 NOTE — Evaluation (Signed)
Physical Therapy Evaluation Patient Details Name: DACI STUBBE MRN: [id number] DOB: 07/13/1940 Today's Date: 04/29/2014   History of Present Illness  Pt is a 73 year old female with c/o diarrhea and generalized weakness.  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  She lives with her husband and brother and over the past 2 weeks has been needinig increasing assistance with ADLs.  She normally ambulates with a walker independently.  She is diagnosed with colitis and currently has a rectal tube as well as foley catheter in place.  She has just moved up to Unit 300 from ICU.  Clinical Impression   Pt was seen for evaluation.  She was alert and pleasant but processes information slowly.  It is best to speak slowly and simply to her.  She was found to be generally deconditioned by her recent illness.  She needed assistance to transfer from supine to sit to stand.  She needed min assist to ambulate with a walker 60'.  She would be appropriate to go to SNF at d/c.  Family is to consider this.  If she goes home, she will need full time supervision and HHPT.    Follow Up Recommendations Home health PT;SNF (to be determined by pt/family)    Equipment Recommendations  None recommended by PT    Recommendations for Other Services  none     Precautions / Restrictions Precautions Precautions: Fall Precaution Comments: Khrystyne Arpin precautions Restrictions Weight Bearing Restrictions: No      Mobility  Bed Mobility Overal bed mobility: Needs Assistance Bed Mobility: Supine to Sit     Supine to sit: Min assist     General bed mobility comments: assist to move LEs and trunk to EOB  Transfers Overall transfer level: Needs assistance Equipment used: Rolling walker (2 wheeled) Transfers: Sit to/from Stand Sit to Stand: Mod assist            Ambulation/Gait Ambulation/Gait assistance: Min guard Ambulation Distance (Feet): 60 Feet Assistive device:  Rolling walker (2 wheeled) Gait Pattern/deviations: Shuffle;Trunk flexed   Gait velocity interpretation: Below normal speed for age/gender    Stairs            Wheelchair Mobility    Modified Rankin (Stroke Patients Only)       Balance Overall balance assessment: Needs assistance Sitting-balance support: No upper extremity supported;Feet supported Sitting balance-Leahy Scale: Good     Standing balance support: Bilateral upper extremity supported Standing balance-Leahy Scale: Fair                               Pertinent Vitals/Pain Pain Assessment: No/denies pain    Home Living Family/patient expects to be discharged to:: Skilled nursing facility Living Arrangements: Spouse/significant other               Additional Comments: discharge disposition is uncertain...pt might go home with full time supervision and HHPT if she doesn't go to SNF.    Husband is to make that determination.    Prior Function Level of Independence: Independent with assistive device(s)         Comments: recently has been needing assist with bathing and dressing due to weakness from diarrhea and dehydration     Hand Dominance        Extremity/Trunk Assessment               Lower Extremity Assessment: Generalized weakness (no impairments  from CVA)         Communication   Communication: Receptive difficulties;Expressive difficulties (mild to moderate)  Cognition Arousal/Alertness: Awake/alert Behavior During Therapy: WFL for tasks assessed/performed Overall Cognitive Status: Within Functional Limits for tasks assessed                      General Comments      Exercises General Exercises - Lower Extremity Ankle Circles/Pumps: AROM;Both;5 reps;Supine Heel Slides: AAROM;Both;5 reps;Supine Hip ABduction/ADduction: AAROM;Both;5 reps;Supine Straight Leg Raises: AROM;Both;5 reps;Supine      Assessment/Plan    PT Assessment Patient needs  continued PT services  PT Diagnosis Difficulty walking;Generalized weakness   PT Problem List Decreased strength;Decreased activity tolerance;Decreased mobility  PT Treatment Interventions Gait training;Functional mobility training;Therapeutic exercise   PT Goals (Current goals can be found in the Care Plan section) Acute Rehab PT Goals Patient Stated Goal: return home PT Goal Formulation: With patient/family Time For Goal Achievement: 05/13/14 Potential to Achieve Goals: Good    Frequency Min 3X/week   Barriers to discharge   none    Co-evaluation               End of Session Equipment Utilized During Treatment: Gait belt Activity Tolerance: Patient tolerated treatment well Patient left: in chair;with call bell/phone within reach;with family/visitor present (chair alarm not available) Nurse Communication: Mobility status         Time: 4332-9518 PT Time Calculation (min) (ACUTE ONLY): 57 min   Charges:   PT Evaluation $Initial PT Evaluation Tier I: 1 Procedure PT Treatments $Therapeutic Exercise: 8-22 mins   PT G Codes:          Sable Feil 04/29/2014, 2:42 PM

## 2014-04-29 NOTE — Progress Notes (Addendum)
Subjective:  Feels weak. Lower abdominal discomfort. Appetite improved. Rectal tube still in place. Minimal output over night per nursing and 50cc since nurse arrived on shift at 7.  Objective: Vital signs in last 24 hours: Temp:  [98.1 F (36.7 C)-98.6 F (37 C)] 98.6 F (37 C) (12/17 0745) Pulse Rate:  [72-110] 81 (12/17 0800) Resp:  [12-29] 22 (12/17 0800) BP: (66-127)/(25-88) 126/60 mmHg (12/17 0800) SpO2:  [77 %-100 %] 96 % (12/17 0800) Weight:  [180 lb 8.9 oz (81.9 kg)] 180 lb 8.9 oz (81.9 kg) (12/17 0500) Last BM Date: 04/29/14 General:   Alert,  Well-developed, well-nourished, pleasant and cooperative in NAD Head:  Normocephalic and atraumatic. Eyes:  Sclera clear, no icterus.   Abdomen:  Soft, nontender and nondistended.  Normal bowel sounds, without guarding, and without rebound.   Extremities:  Without clubbing, deformity or edema. Neurologic:  Alert and  oriented x4;  grossly normal neurologically. Skin:  Intact without significant lesions or rashes. Psych:  Alert and cooperative. Normal mood and affect.  Intake/Output from previous day: 12/16 0701 - 12/17 0700 In: -  Out: 2400 [Urine:2200; Stool:200] Intake/Output this shift: Total I/O In: -  Out: 175 [Urine:175]  Lab Results: CBC  Recent Labs  04/26/14 1705 04/27/14 0500 04/28/14 0534  WBC 18.3* 22.3* 15.4*  HGB 10.2* 10.6* 9.0*  HCT 30.1* 32.8* 27.0*  MCV 86.5 88.9 87.9  PLT 273 265 241   BMET  Recent Labs  04/26/14 1705 04/27/14 0500 04/28/14 0534  NA 138 141 139  K 4.8 4.6 3.4*  CL 104 111 111  CO2 22 15* 18*  GLUCOSE 102* 77 81  BUN 21 16 9   CREATININE 1.31* 0.82 0.59  CALCIUM 7.8* 6.9* 6.2*   LFTs  Recent Labs  04/26/14 1705 04/27/14 0500 04/28/14 0534  BILITOT 0.6 0.4 0.2*  ALKPHOS 103 94 80  AST 51* 43* 17  ALT 27 25 19   PROT 4.7* 4.4* 3.6*  ALBUMIN 2.3* 2.0* 1.7*    Recent Labs  04/26/14 1705  LIPASE 41   PT/INR No results for input(s): LABPROT, INR in the  last 72 hours.    Imaging Studies: Ct Abdomen Pelvis W Contrast  04/27/2014   CLINICAL DATA:  Patient with abdominal pain and diarrhea.  EXAM: CT ABDOMEN AND PELVIS WITH CONTRAST  TECHNIQUE: Multidetector CT imaging of the abdomen and pelvis was performed using the standard protocol following bolus administration of intravenous contrast.  CONTRAST:  133mL OMNIPAQUE IOHEXOL 300 MG/ML  SOLN  COMPARISON:  CT 04/28/2012; 01/21/2011.  FINDINGS: Visualization of the lower thorax demonstrates small bilateral pleural effusions. Consolidative opacity within the dependent right lower lobe, nonspecific, potentially representing atelectasis. Infection could have a similar appearance.  The liver is normal in size and contour without focal hepatic lesion identified. Stable mild intrahepatic and extrahepatic biliary ductal dilatation. Status post cholecystectomy. Spleen is unremarkable. Normal bilateral adrenal glands. Kidneys enhance symmetrically with contrast. No hydronephrosis. Urinary bladder is unremarkable.  Infrarenal abdominal aortic ectasia measuring 2.2 cm. Scattered calcified atherosclerotic plaque involving the abdominal aorta and iliac vessels.  Rectal tube is in place. There is perirectal fat stranding and a small amount of fluid within the pelvis. The colon is decompressed, limiting evaluation however there is fairly diffuse colonic wall thickening, mostly involving the descending and sigmoid colon. Oral contrast material within the small bowel. No evidence for small bowel obstruction. Mildly prominent small-bowel loops within the left upper quadrant. No free fluid or free intraperitoneal air. Stable postsurgical changes  involving the stomach.  No aggressive or acute appearing osseous lesions. Lower lumbar spine degenerative change. Left lateral chest wall lipoma.  IMPRESSION: Diffuse wall thickening of the colon, most pronounced involving the descending and sigmoid colon, compatible with colitis.  Considerations include infectious, inflammatory or ischemic etiologies. Consider correlation with colonoscopy after resolution of the acute symptomatology to exclude underlying mass.  Prominence of the small bowel loops within the left upper quadrant may represent associated ileus.  Small bilateral pleural effusions. Consolidative opacity within the right lower lobe may represent atelectasis. Infection is not excluded. Recommend radiographic followup to ensure resolution.  Unchanged mild intrahepatic and extrahepatic biliary ductal dilatation status post cholecystectomy.  These results will be called to the ordering clinician or representative by the Radiologist Assistant, and communication documented in the PACS or zVision Dashboard.   Electronically Signed   By: Lovey Newcomer M.D.   On: 04/27/2014 13:14   US Venous Img Lower Bilateral  04/27/2014   CLINICAL DATA:  Left lower extremity edema and right lower extremity pain.  EXAM: BILATERAL LOWER EXTREMITY VENOUS DOPPLER ULTRASOUND  TECHNIQUE: Gray-scale sonography with graded compression, as well as color Doppler and duplex ultrasound were performed to evaluate the lower extremity deep venous systems from the level of the common femoral vein and including the common femoral, femoral, profunda femoral, popliteal and calf veins including the posterior tibial, peroneal and gastrocnemius veins when visible. The superficial great saphenous vein was also interrogated. Spectral Doppler was utilized to evaluate flow at rest and with distal augmentation maneuvers in the common femoral, femoral and popliteal veins.  COMPARISON:  None.  FINDINGS: RIGHT LOWER EXTREMITY  Common Femoral Vein: No evidence of thrombus. Normal compressibility, respiratory phasicity and response to augmentation.  Saphenofemoral Junction: No evidence of thrombus. Normal compressibility and flow on color Doppler imaging.  Profunda Femoral Vein: No evidence of thrombus. Normal compressibility and  flow on color Doppler imaging.  Femoral Vein: No evidence of thrombus. Normal compressibility, respiratory phasicity and response to augmentation.  Popliteal Vein: No evidence of thrombus. Normal compressibility, respiratory phasicity and response to augmentation.  Calf Veins: No evidence of thrombus. Normal compressibility and flow on color Doppler imaging.  Superficial Great Saphenous Vein: No evidence of thrombus. Normal compressibility and flow on color Doppler imaging.  Venous Reflux:  None.  Other Findings: No evidence of superficial thrombophlebitis or abnormal fluid collection.  LEFT LOWER EXTREMITY  Common Femoral Vein: No evidence of thrombus. Normal compressibility, respiratory phasicity and response to augmentation.  Saphenofemoral Junction: No evidence of thrombus. Normal compressibility and flow on color Doppler imaging.  Profunda Femoral Vein: No evidence of thrombus. Normal compressibility and flow on color Doppler imaging.  Femoral Vein: No evidence of thrombus. Normal compressibility, respiratory phasicity and response to augmentation.  Popliteal Vein: No evidence of thrombus. Normal compressibility, respiratory phasicity and response to augmentation.  Calf Veins: No evidence of thrombus. Normal compressibility and flow on color Doppler imaging.  Superficial Great Saphenous Vein: No evidence of thrombus. Normal compressibility and flow on color Doppler imaging.  Venous Reflux:  None.  Other Findings: No evidence of superficial thrombophlebitis or abnormal fluid collection.  IMPRESSION: No evidence of bilateral lower extremity deep venous thrombosis.   Electronically Signed   By: Aletta Edouard M.D.   On: 04/27/2014 10:32   Dg Abd Acute W/chest  04/26/2014   CLINICAL DATA:  Diarrhea.  EXAM: ACUTE ABDOMEN SERIES (ABDOMEN 2 VIEW & CHEST 1 VIEW)  COMPARISON:  March 29, 2014.  FINDINGS: There  is no evidence of dilated bowel loops or free intraperitoneal air. Status post cholecystectomy. Surgical  clips are noted in the left upper quadrant pelvis is well. Phleboliths are noted in the pelvis. Heart size and mediastinal contours are within normal limits. Both lungs are clear.  IMPRESSION: No evidence of bowel obstruction or ileus. No acute cardiopulmonary abnormality seen.   Electronically Signed   By: Sabino Dick M.D.   On: 04/26/2014 18:30  [2 weeks]   Assessment: 73 year old female admitted with profuse, watery, maladorous diarrhea with CT findings of diffuse wall thickening of the colon but most pronounced in descending and sigmoid colon, compatible with colitis. Cdiff PCR negative, and GI pathogen panel is still pending (called [facility] and they have specimen and still in process). Dealing with infectious process clinically, with low concern for ischemic etiology or new-onset IBD. Appears stool output has decreased since admission.  Will follow-up on GI pathogen panel as available and continue current regimen with close monitoring of clinical status and laboratory findings. If no improvement or decline in status, needs flex sig. Otherwise, outpatient colonoscopy indicated once acute illness resolved. As of note, +FH of colon cancer in brother.   Anemia: s/p gastric bypass. IDA. Malabsorptive likely due to post-surgical changes. Colonoscopy still indicated with possible EGD as outpatient.   Plan: F/u on pending GI pathogen panel. Hold off on sigmoidoscopy for now, clinically appears some improved. Soft diet as scheduled.  Plans for outpatient colonoscopy +/- EGD, but flex sig as inpatient if no improvement or worsening in clinical status.    LOS: 3 days   [name]  04/29/2014, 8:22 AM   Attending note  Flexiseal out. Diarrhea has tapered off   Discussed with granddaughter Investment banker, corporate). Await GI pathogen panel results. Marland Kitchen

## 2014-04-29 NOTE — Progress Notes (Signed)
[facility] PROGRESS NOTE  RUBERTA HOLCK DDU:[id number] DOB: 03-10-1941 DOA: 04/26/2014 PCP: Rubbie Battiest, MD  Assessment/Plan:  Principal Problem:   Diarrhea -Suspect secondary to infectious colitis vs inflammatory. CT scan of abdomen and pelvis performed on 04/27/2014 showing diffuse wall thickening of the colon -Rectal tube in place as she continues to have liquid consistency bowel movements -Workup thus far has included stool for C. difficile which a back negative. Stool pathogen panel pending -Currently on broad-spectrum IV antimicrobial therapy with cefepime and Flagyl -Gastroenterology consulted, considering sigmoidoscopy today -Patient mentating well, hemodynamically stable, will transfer to telemetry  Active Problems:  Hypotension -Patient initially hypotensive responding to IV fluids -Likely secondary to hypovolemia in setting of diarrhea -Presently hemodynamically stable, continue boluses with normal saline as needed    Fibromyalgia - Continue supportive therapy, stable    GERD (gastroesophageal reflux disease) - Stable on Protonix    Hypothyroidism -continue Synthroid 112 g by mouth daily -TSH checked on 03/30/2014 within normal limits at 0.698    Depression - Stable continue Paxil    Hypotension - Most likely from diarrhea - Fluid boluses PRN, if no improvement plan will be to consult intensivist for further recommendations.   Code Status: Partial Family Communication: None at bedside Disposition Plan:  Will transfer to telemetry. PT consult, at a bed to chair   Consultants:  GI  Procedures:  None  Antibiotics:  Cefepime and metronidazole  HPI/Subjective: Patient reporting feeling about the same, has ongoing generalized abdominal pain, denies nausea, vomiting, hematemesis   Objective: Filed Vitals:   04/29/14 0745  BP:   Pulse:   Temp: 98.6 F (37 C)  Resp:     Intake/Output Summary (Last 24 hours) at 04/29/14 0815 Last data  filed at 04/29/14 0745  Gross per 24 hour  Intake      0 ml  Output   2575 ml  Net  -2575 ml   Filed Weights   04/26/14 2055 04/27/14 0500 04/29/14 0500  Weight: 80.2 kg (176 lb 12.9 oz) 80.2 kg (176 lb 12.9 oz) 81.9 kg (180 lb 8.9 oz)    Exam:   General:  Pt in nad, alert and awake  Cardiovascular: rrr, no mrg  Respiratory: cta bl, no wheezes  Abdomen: soft, mild generalized tenderness to palpation across abdomen, positive bowel sounds, no rebound tenderness guarding or peritoneal signs   Musculoskeletal: no cyanosis or clubbing   Data Reviewed: Basic Metabolic Panel:  Recent Labs Lab 04/26/14 1705 04/27/14 0500 04/28/14 0534  NA 138 141 139  K 4.8 4.6 3.4*  CL 104 111 111  CO2 22 15* 18*  GLUCOSE 102* 77 81  BUN 21 16 9   CREATININE 1.31* 0.82 0.59  CALCIUM 7.8* 6.9* 6.2*   Liver Function Tests:  Recent Labs Lab 04/26/14 1705 04/27/14 0500 04/28/14 0534  AST 51* 43* 17  ALT 27 25 19   ALKPHOS 103 94 80  BILITOT 0.6 0.4 0.2*  PROT 4.7* 4.4* 3.6*  ALBUMIN 2.3* 2.0* 1.7*    Recent Labs Lab 04/26/14 1705  LIPASE 41   No results for input(s): AMMONIA in the last 168 hours. CBC:  Recent Labs Lab 04/26/14 1705 04/27/14 0500 04/28/14 0534  WBC 18.3* 22.3* 15.4*  NEUTROABS 16.5*  --   --   HGB 10.2* 10.6* 9.0*  HCT 30.1* 32.8* 27.0*  MCV 86.5 88.9 87.9  PLT 273 265 241   Cardiac Enzymes:  Recent Labs Lab 04/26/14 1705  TROPONINI <0.30   BNP (last 3  results) No results for input(s): PROBNP in the last 8760 hours. CBG: No results for input(s): GLUCAP in the last 168 hours.  Recent Results (from the past 240 hour(s))  Clostridium Difficile by PCR     Status: None   Collection Time: 04/26/14  4:10 PM  Result Value Ref Range Status   C difficile by pcr NEGATIVE NEGATIVE Final  MRSA PCR Screening     Status: None   Collection Time: 04/26/14  8:40 PM  Result Value Ref Range Status   MRSA by PCR NEGATIVE NEGATIVE Final    Comment:         The GeneXpert MRSA Assay (FDA approved for NASAL specimens only), is one component of a comprehensive MRSA colonization surveillance program. It is not intended to diagnose MRSA infection nor to guide or monitor treatment for MRSA infections.   Culture, Urine     Status: None   Collection Time: 04/27/14  5:40 AM  Result Value Ref Range Status   Specimen Description URINE, CATHETERIZED  Final   Special Requests NONE  Final   Culture  Setup Time   Final    04/27/2014 14:55 Performed at [place] Performed at [facility]   Final   Culture NO GROWTH Performed at [facility]   Final   Report Status 04/28/2014 FINAL  Final     Studies: Ct Abdomen Pelvis W Contrast  04/27/2014   CLINICAL DATA:  Patient with abdominal pain and diarrhea.  EXAM: CT ABDOMEN AND PELVIS WITH CONTRAST  TECHNIQUE: Multidetector CT imaging of the abdomen and pelvis was performed using the standard protocol following bolus administration of intravenous contrast.  CONTRAST:  169mL OMNIPAQUE IOHEXOL 300 MG/ML  SOLN  COMPARISON:  CT 04/28/2012; 01/21/2011.  FINDINGS: Visualization of the lower thorax demonstrates small bilateral pleural effusions. Consolidative opacity within the dependent right lower lobe, nonspecific, potentially representing atelectasis. Infection could have a similar appearance.  The liver is normal in size and contour without focal hepatic lesion identified. Stable mild intrahepatic and extrahepatic biliary ductal dilatation. Status post cholecystectomy. Spleen is unremarkable. Normal bilateral adrenal glands. Kidneys enhance symmetrically with contrast. No hydronephrosis. Urinary bladder is unremarkable.  Infrarenal abdominal aortic ectasia measuring 2.2 cm. Scattered calcified atherosclerotic plaque involving the abdominal aorta and iliac vessels.  Rectal tube is in place. There is perirectal fat stranding and a small amount of fluid within  the pelvis. The colon is decompressed, limiting evaluation however there is fairly diffuse colonic wall thickening, mostly involving the descending and sigmoid colon. Oral contrast material within the small bowel. No evidence for small bowel obstruction. Mildly prominent small-bowel loops within the left upper quadrant. No free fluid or free intraperitoneal air. Stable postsurgical changes involving the stomach.  No aggressive or acute appearing osseous lesions. Lower lumbar spine degenerative change. Left lateral chest wall lipoma.  IMPRESSION: Diffuse wall thickening of the colon, most pronounced involving the descending and sigmoid colon, compatible with colitis. Considerations include infectious, inflammatory or ischemic etiologies. Consider correlation with colonoscopy after resolution of the acute symptomatology to exclude underlying mass.  Prominence of the small bowel loops within the left upper quadrant may represent associated ileus.  Small bilateral pleural effusions. Consolidative opacity within the right lower lobe may represent atelectasis. Infection is not excluded. Recommend radiographic followup to ensure resolution.  Unchanged mild intrahepatic and extrahepatic biliary ductal dilatation status post cholecystectomy.  These results will be called to the ordering clinician or representative  by the Radiologist Assistant, and communication documented in the PACS or zVision Dashboard.   Electronically Signed   By: Lovey Newcomer M.D.   On: 04/27/2014 13:14   US Venous Img Lower Bilateral  04/27/2014   CLINICAL DATA:  Left lower extremity edema and right lower extremity pain.  EXAM: BILATERAL LOWER EXTREMITY VENOUS DOPPLER ULTRASOUND  TECHNIQUE: Gray-scale sonography with graded compression, as well as color Doppler and duplex ultrasound were performed to evaluate the lower extremity deep venous systems from the level of the common femoral vein and including the common femoral, femoral, profunda femoral,  popliteal and calf veins including the posterior tibial, peroneal and gastrocnemius veins when visible. The superficial great saphenous vein was also interrogated. Spectral Doppler was utilized to evaluate flow at rest and with distal augmentation maneuvers in the common femoral, femoral and popliteal veins.  COMPARISON:  None.  FINDINGS: RIGHT LOWER EXTREMITY  Common Femoral Vein: No evidence of thrombus. Normal compressibility, respiratory phasicity and response to augmentation.  Saphenofemoral Junction: No evidence of thrombus. Normal compressibility and flow on color Doppler imaging.  Profunda Femoral Vein: No evidence of thrombus. Normal compressibility and flow on color Doppler imaging.  Femoral Vein: No evidence of thrombus. Normal compressibility, respiratory phasicity and response to augmentation.  Popliteal Vein: No evidence of thrombus. Normal compressibility, respiratory phasicity and response to augmentation.  Calf Veins: No evidence of thrombus. Normal compressibility and flow on color Doppler imaging.  Superficial Great Saphenous Vein: No evidence of thrombus. Normal compressibility and flow on color Doppler imaging.  Venous Reflux:  None.  Other Findings: No evidence of superficial thrombophlebitis or abnormal fluid collection.  LEFT LOWER EXTREMITY  Common Femoral Vein: No evidence of thrombus. Normal compressibility, respiratory phasicity and response to augmentation.  Saphenofemoral Junction: No evidence of thrombus. Normal compressibility and flow on color Doppler imaging.  Profunda Femoral Vein: No evidence of thrombus. Normal compressibility and flow on color Doppler imaging.  Femoral Vein: No evidence of thrombus. Normal compressibility, respiratory phasicity and response to augmentation.  Popliteal Vein: No evidence of thrombus. Normal compressibility, respiratory phasicity and response to augmentation.  Calf Veins: No evidence of thrombus. Normal compressibility and flow on color Doppler  imaging.  Superficial Great Saphenous Vein: No evidence of thrombus. Normal compressibility and flow on color Doppler imaging.  Venous Reflux:  None.  Other Findings: No evidence of superficial thrombophlebitis or abnormal fluid collection.  IMPRESSION: No evidence of bilateral lower extremity deep venous thrombosis.   Electronically Signed   By: Aletta Edouard M.D.   On: 04/27/2014 10:32    Scheduled Meds: . acidophilus  1 capsule Oral Daily  . ALPRAZolam  1 mg Oral BID  . antiseptic oral rinse  7 mL Mouth Rinse BID  . aspirin  81 mg Oral Daily  . ceFEPime (MAXIPIME) IV  1 g Intravenous Q8H  . feeding supplement  237 mL Oral TID BM  . levothyroxine  112 mcg Oral QAC breakfast  . metronidazole  500 mg Intravenous Q8H  . nicotine  21 mg Transdermal Daily  . pantoprazole  40 mg Oral Daily  . PARoxetine  40 mg Oral Daily   Continuous Infusions: . sodium chloride 75 mL/hr at 04/28/14 1200     Time spent: 30 min minutes    Kelvin Cellar  [facility] Pager [phone]. If 7PM-7AM, please contact night-coverage at [email or web address], password [facility] 04/29/2014, 8:15 AM  LOS: 3 days

## 2014-04-30 LAB — GI PATHOGEN PANEL BY PCR, STOOL
C DIFFICILE TOXIN A/B: NEGATIVE
Campylobacter by PCR: NEGATIVE
Cryptosporidium by PCR: NEGATIVE
E COLI (STEC): NEGATIVE
E coli (ETEC) LT/ST: NEGATIVE
E coli 0157 by PCR: NEGATIVE
G lamblia by PCR: NEGATIVE
Norovirus GI/GII: NEGATIVE
Rotavirus A by PCR: NEGATIVE
SALMONELLA BY PCR: NEGATIVE
Shigella by PCR: NEGATIVE

## 2014-04-30 LAB — COMPREHENSIVE METABOLIC PANEL
ALBUMIN: 1.6 g/dL — AB (ref 3.5–5.2)
ALK PHOS: 85 U/L (ref 39–117)
ALT: 17 U/L (ref 0–35)
ANION GAP: 10 (ref 5–15)
AST: 16 U/L (ref 0–37)
BILIRUBIN TOTAL: 0.2 mg/dL — AB (ref 0.3–1.2)
BUN: 3 mg/dL — ABNORMAL LOW (ref 6–23)
CALCIUM: 7 mg/dL — AB (ref 8.4–10.5)
CO2: 19 mEq/L (ref 19–32)
Chloride: 113 mEq/L — ABNORMAL HIGH (ref 96–112)
Creatinine, Ser: 0.49 mg/dL — ABNORMAL LOW (ref 0.50–1.10)
GFR calc Af Amer: >90 mL/min (ref >90)
GLUCOSE: 77 mg/dL (ref 70–99)
Potassium: 3.2 mEq/L — ABNORMAL LOW (ref 3.7–5.3)
SODIUM: 142 meq/L (ref 137–147)
Total Protein: 3.7 g/dL — ABNORMAL LOW (ref 6.0–8.3)

## 2014-04-30 LAB — CBC
HCT: 26 % — ABNORMAL LOW (ref 36.0–46.0)
Hemoglobin: 8.7 g/dL — ABNORMAL LOW (ref 12.0–15.0)
MCH: 29.7 pg (ref 26.0–34.0)
MCHC: 33.5 g/dL (ref 30.0–36.0)
MCV: 88.7 fL (ref 78.0–100.0)
Platelets: 250 10*3/uL (ref 150–400)
RBC: 2.93 MIL/uL — ABNORMAL LOW (ref 3.87–5.11)
RDW: 22.4 % — ABNORMAL HIGH (ref 11.5–15.5)
WBC: 6.7 10*3/uL (ref 4.0–10.5)

## 2014-04-30 MED ORDER — BOOST HIGH PROTEIN PO LIQD: 237.0000 mL | Freq: Three times a day (TID) | ORAL | Status: DC

## 2014-04-30 MED ORDER — POTASSIUM CHLORIDE CRYS ER 20 MEQ PO TBCR
40.0000 meq | EXTENDED_RELEASE_TABLET | Freq: Once | ORAL | Status: DC
  Filled 2014-04-30: qty 2

## 2014-04-30 MED ORDER — AMOXICILLIN-POT CLAVULANATE 875-125 MG PO TABS: 1.0000 | ORAL_TABLET | Freq: Two times a day (BID) | ORAL | Status: DC

## 2014-04-30 MED ORDER — METRONIDAZOLE 500 MG PO TABS: 500.0000 mg | ORAL_TABLET | Freq: Three times a day (TID) | ORAL | Status: DC

## 2014-04-30 MED ORDER — ACETAMINOPHEN 325 MG PO TABS: 650.0000 mg | ORAL_TABLET | Freq: Four times a day (QID) | ORAL | Status: AC | PRN

## 2014-04-30 NOTE — Progress Notes (Signed)
Subjective:  Appetite remains poor. Stomach pain somewhat improved. 4 BMs over the last 24 hours. Last stool per patient was small.   Objective: Vital signs in last 24 hours: Temp:  [97.4 F (36.3 C)-99.2 F (37.3 C)] 98.9 F (37.2 C) (12/18 0523) Pulse Rate:  [72-89] 82 (12/18 0523) Resp:  [18] 18 (12/18 0523) BP: (92-121)/(39-58) 121/54 mmHg (12/18 0523) SpO2:  [94 %-98 %] 98 % (12/18 0523) Last BM Date: 04/29/14 General:   Alert,  Well-developed, well-nourished, pleasant and cooperative in NAD Head:  Normocephalic and atraumatic. Eyes:  Sclera clear, no icterus.   Abdomen:  Soft, nontender and nondistended.  Normal bowel sounds, without guarding, and without rebound.   Extremities:  Without clubbing, deformity or edema. Neurologic:  Alert and  oriented x4;  grossly normal neurologically. Skin:  Intact without significant lesions or rashes. Psych:  Alert and cooperative. Normal mood and affect.  Intake/Output from previous day: 12/17 0701 - 12/18 0700 In: 360 [P.O.:360] Out: 1325 [Urine:1275; Stool:50] Intake/Output this shift:    Lab Results: CBC  Recent Labs  04/28/14 0534 04/30/14 0600  WBC 15.4* 6.7  HGB 9.0* 8.7*  HCT 27.0* 26.0*  MCV 87.9 88.7  PLT 241 250   BMET  Recent Labs  04/28/14 0534 04/30/14 0600  NA 139 142  K 3.4* 3.2*  CL 111 113*  CO2 18* 19  GLUCOSE 81 77  BUN 9 3*  CREATININE 0.59 0.49*  CALCIUM 6.2* 7.0*   LFTs  Recent Labs  04/28/14 0534 04/30/14 0600  BILITOT 0.2* 0.2*  ALKPHOS 80 85  AST 17 16  ALT 19 17  PROT 3.6* 3.7*  ALBUMIN 1.7* 1.6*   GI pathogen panel negative.   No results for input(s): LIPASE in the last 72 hours. PT/INR No results for input(s): LABPROT, INR in the last 72 hours.    Imaging Studies: Ct Abdomen Pelvis W Contrast  04/27/2014   CLINICAL DATA:  Patient with abdominal pain and diarrhea.  EXAM: CT ABDOMEN AND PELVIS WITH CONTRAST  TECHNIQUE: Multidetector CT imaging of the abdomen and  pelvis was performed using the standard protocol following bolus administration of intravenous contrast.  CONTRAST:  153mL OMNIPAQUE IOHEXOL 300 MG/ML  SOLN  COMPARISON:  CT 04/28/2012; 01/21/2011.  FINDINGS: Visualization of the lower thorax demonstrates small bilateral pleural effusions. Consolidative opacity within the dependent right lower lobe, nonspecific, potentially representing atelectasis. Infection could have a similar appearance.  The liver is normal in size and contour without focal hepatic lesion identified. Stable mild intrahepatic and extrahepatic biliary ductal dilatation. Status post cholecystectomy. Spleen is unremarkable. Normal bilateral adrenal glands. Kidneys enhance symmetrically with contrast. No hydronephrosis. Urinary bladder is unremarkable.  Infrarenal abdominal aortic ectasia measuring 2.2 cm. Scattered calcified atherosclerotic plaque involving the abdominal aorta and iliac vessels.  Rectal tube is in place. There is perirectal fat stranding and a small amount of fluid within the pelvis. The colon is decompressed, limiting evaluation however there is fairly diffuse colonic wall thickening, mostly involving the descending and sigmoid colon. Oral contrast material within the small bowel. No evidence for small bowel obstruction. Mildly prominent small-bowel loops within the left upper quadrant. No free fluid or free intraperitoneal air. Stable postsurgical changes involving the stomach.  No aggressive or acute appearing osseous lesions. Lower lumbar spine degenerative change. Left lateral chest wall lipoma.  IMPRESSION: Diffuse wall thickening of the colon, most pronounced involving the descending and sigmoid colon, compatible with colitis. Considerations include infectious, inflammatory or ischemic etiologies. Consider  correlation with colonoscopy after resolution of the acute symptomatology to exclude underlying mass.  Prominence of the small bowel loops within the left upper quadrant  may represent associated ileus.  Small bilateral pleural effusions. Consolidative opacity within the right lower lobe may represent atelectasis. Infection is not excluded. Recommend radiographic followup to ensure resolution.  Unchanged mild intrahepatic and extrahepatic biliary ductal dilatation status post cholecystectomy.  These results will be called to the ordering clinician or representative by the Radiologist Assistant, and communication documented in the PACS or zVision Dashboard.   Electronically Signed   By: Lovey Newcomer M.D.   On: 04/27/2014 13:14   US Venous Img Lower Bilateral  04/27/2014   CLINICAL DATA:  Left lower extremity edema and right lower extremity pain.  EXAM: BILATERAL LOWER EXTREMITY VENOUS DOPPLER ULTRASOUND  TECHNIQUE: Gray-scale sonography with graded compression, as well as color Doppler and duplex ultrasound were performed to evaluate the lower extremity deep venous systems from the level of the common femoral vein and including the common femoral, femoral, profunda femoral, popliteal and calf veins including the posterior tibial, peroneal and gastrocnemius veins when visible. The superficial great saphenous vein was also interrogated. Spectral Doppler was utilized to evaluate flow at rest and with distal augmentation maneuvers in the common femoral, femoral and popliteal veins.  COMPARISON:  None.  FINDINGS: RIGHT LOWER EXTREMITY  Common Femoral Vein: No evidence of thrombus. Normal compressibility, respiratory phasicity and response to augmentation.  Saphenofemoral Junction: No evidence of thrombus. Normal compressibility and flow on color Doppler imaging.  Profunda Femoral Vein: No evidence of thrombus. Normal compressibility and flow on color Doppler imaging.  Femoral Vein: No evidence of thrombus. Normal compressibility, respiratory phasicity and response to augmentation.  Popliteal Vein: No evidence of thrombus. Normal compressibility, respiratory phasicity and response to  augmentation.  Calf Veins: No evidence of thrombus. Normal compressibility and flow on color Doppler imaging.  Superficial Great Saphenous Vein: No evidence of thrombus. Normal compressibility and flow on color Doppler imaging.  Venous Reflux:  None.  Other Findings: No evidence of superficial thrombophlebitis or abnormal fluid collection.  LEFT LOWER EXTREMITY  Common Femoral Vein: No evidence of thrombus. Normal compressibility, respiratory phasicity and response to augmentation.  Saphenofemoral Junction: No evidence of thrombus. Normal compressibility and flow on color Doppler imaging.  Profunda Femoral Vein: No evidence of thrombus. Normal compressibility and flow on color Doppler imaging.  Femoral Vein: No evidence of thrombus. Normal compressibility, respiratory phasicity and response to augmentation.  Popliteal Vein: No evidence of thrombus. Normal compressibility, respiratory phasicity and response to augmentation.  Calf Veins: No evidence of thrombus. Normal compressibility and flow on color Doppler imaging.  Superficial Great Saphenous Vein: No evidence of thrombus. Normal compressibility and flow on color Doppler imaging.  Venous Reflux:  None.  Other Findings: No evidence of superficial thrombophlebitis or abnormal fluid collection.  IMPRESSION: No evidence of bilateral lower extremity deep venous thrombosis.   Electronically Signed   By: Aletta Edouard M.D.   On: 04/27/2014 10:32   Dg Abd Acute W/chest  04/26/2014   CLINICAL DATA:  Diarrhea.  EXAM: ACUTE ABDOMEN SERIES (ABDOMEN 2 VIEW & CHEST 1 VIEW)  COMPARISON:  March 29, 2014.  FINDINGS: There is no evidence of dilated bowel loops or free intraperitoneal air. Status post cholecystectomy. Surgical clips are noted in the left upper quadrant pelvis is well. Phleboliths are noted in the pelvis. Heart size and mediastinal contours are within normal limits. Both lungs are clear.  IMPRESSION: No  evidence of bowel obstruction or ileus. No acute  cardiopulmonary abnormality seen.   Electronically Signed   By: Sabino Dick M.D.   On: 04/26/2014 18:30  [2 weeks]   Assessment:  72 year old female admitted with profuse, watery, maladorous diarrhea with CT findings of diffuse wall thickening of the colon but most pronounced in descending and sigmoid colon, compatible with colitis. Cdiff PCR negative, and GI pathogen panel negative. Dealing with infectious process clinically, with low concern for ischemic etiology or new-onset IBD. Stool output has decreased since admission.  No need for flex sig at this time. Outpatient colonoscopy indicated once acute illness resolved. As of note, +FH of colon cancer in brother.   Anemia: s/p gastric bypass. IDA. Malabsorptive likely due to post-surgical changes. Colonoscopy still indicated with possible EGD as outpatient.   Plan: Encouraged oral intake.  Continue supportive measures.  Plans for outpatient colonoscopy +/- EGD.   LOS: 4 days   [name]  04/30/2014, 8:47 AM

## 2014-04-30 NOTE — Discharge Summary (Signed)
Physician Discharge Summary  [name] MWU:[id number] DOB: [birth year] DOA: 04/26/2014  PCP: Rubbie Battiest, MD  Admit date: 04/26/2014 Discharge date: 04/30/2014  Time spent: > 35 minutes  Recommendations for Outpatient Follow-up:  1. Reassess K levels 2. Please see d/c instructions below.  Discharge Diagnoses:  Please see below   Discharge Condition: stable  Diet recommendation: soft diet  Filed Weights   04/26/14 2055 04/27/14 0500 04/29/14 0500  Weight: 80.2 kg (176 lb 12.9 oz) 80.2 kg (176 lb 12.9 oz) 81.9 kg (180 lb 8.9 oz)    History of present illness:  From original HPI:  73 y.o. female Started feeling weak and dizzy yesterday. Diarrhea started today. When asked how many episodes, pt states "Too many to count." watery and non-bloody. Mild intermittent lower abd campy sensation. Has not tried anythign to make it better. Associated w/ nausea but no emesis  Hospital Course:  Principal Problem:  Diarrhea - Gi recommending f/u for outpatient colonoscopy - Gi pathogen panel negative per GI notes. - Will continue metronidazole and augmentin on d/c.   Active Problems:  Anxiety - Stable patient on Xanax   Fibromyalgia - Continue supportive therapy, stable   GERD (gastroesophageal reflux disease) - Stable on Protonix   Hypothyroidism - Stable on current Synthroid regimen   Tobacco abuse - Continue to recommend cessation   Depression - Stable continue Paxil   Hypotension - Resolved with improvement in oral intake. - will d/c on nutritional supplements  Procedures:  None  Consultations:  GI  Discharge Exam: Filed Vitals:   04/30/14 1523  BP: 119/59  Pulse: 85  Temp: 98.9 F (37.2 C)  Resp:     General: Pt in nad, alert and awake Cardiovascular: rrr, no mrg Respiratory: cta bl, no wheezes  Discharge Instructions You were cared for by a hospitalist during your hospital stay. If you have any questions about your discharge  medications or the care you received while you were in the hospital after you are discharged, you can call the unit and asked to speak with the hospitalist on call if the hospitalist that took care of you is not available. Once you are discharged, your primary care physician will handle any further medical issues. Please note that NO REFILLS for any discharge medications will be authorized once you are discharged, as it is imperative that you return to your primary care physician (or establish a relationship with a primary care physician if you do not have one) for your aftercare needs so that they can reassess your need for medications and monitor your lab values.  Discharge Instructions    Call MD for:  severe uncontrolled pain    Complete by:  As directed      Call MD for:  temperature >100.4    Complete by:  As directed      Diet - low sodium heart healthy    Complete by:  As directed      Discharge instructions    Complete by:  As directed   Please follow-up with your primary care physician within the next 1-2 weeks for further evaluation recommendations. Recommend you get your potassium levels rechecked     Increase activity slowly    Complete by:  As directed           Current Discharge Medication List    START taking these medications   Details  acetaminophen (TYLENOL) 325 MG tablet Take 2 tablets (650 mg total) by mouth every 6 (six) hours as  needed for mild pain (or Fever >/= 101).    amoxicillin-clavulanate (AUGMENTIN) 875-125 MG per tablet Take 1 tablet by mouth 2 (two) times daily. Qty: 14 tablet, Refills: 0    feeding supplement (BOOST HIGH PROTEIN) LIQD Take 237 mLs by mouth 3 (three) times daily between meals. Qty: 30 Can, Refills: 0    metroNIDAZOLE (FLAGYL) 500 MG tablet Take 1 tablet (500 mg total) by mouth 3 (three) times daily. Qty: 21 tablet, Refills: 0      CONTINUE these medications which have NOT CHANGED   Details  alendronate (FOSAMAX) 70 MG tablet TAKE 1  TABLET BY MOUTH WEEKLY Qty: 12 tablet, Refills: 1    ALPRAZolam (XANAX) 1 MG tablet TAKE 1 TABLET BY MOUTH TWICE A DAY Qty: 60 tablet, Refills: 5    aspirin 81 MG chewable tablet Chew 1 tablet (81 mg total) by mouth daily.    hydrOXYzine (ATARAX/VISTARIL) 25 MG tablet TAKE 1 TABLET BY MOUTH EVERY 6 HOURS AS NEEDED FOR ITCHING Qty: 25 tablet, Refills: 5    levothyroxine (SYNTHROID, LEVOTHROID) 112 MCG tablet TAKE 1 TABLET BY MOUTH EVERY DAY Qty: 90 tablet, Refills: 1    nortriptyline (PAMELOR) 50 MG capsule TAKE ONE CAPSULE AT BEDTIME Qty: 90 capsule, Refills: 1    pantoprazole (PROTONIX) 40 MG tablet TAKE 1 TABLET BY MOUTH EVERY DAY **STOP RANITIDINE Qty: 90 tablet, Refills: 1    PARoxetine (PAXIL) 40 MG tablet TAKE 1 TABLET BY MOUTH EVERY DAY AS DIRECTED Qty: 90 tablet, Refills: 1    pravastatin (PRAVACHOL) 20 MG tablet TAKE 1 TABLET EVERY DAY Qty: 90 tablet, Refills: 1    Misc Natural Products (OSTEO BI-FLEX ADV DOUBLE ST PO) Take 1 tablet by mouth daily.    Multiple Vitamin (MULTIVITAMIN WITH MINERALS) TABS Take 1 tablet by mouth daily.      STOP taking these medications     HYDROcodone-acetaminophen (NORCO) 10-325 MG per tablet      lisinopril (PRINIVIL,ZESTRIL) 20 MG tablet      polyethylene glycol powder (MIRALAX) powder        Allergies  Allergen Reactions  . Levaquin [Levofloxacin In D5w] Swelling    To mouth  . Macrobid [Nitrofurantoin Macrocrystal]     Patient can't remember reaction.   Follow-up Information    Follow up with [facility].   Contact information:   [address] [place] [address] [phone]        The results of significant diagnostics from this hospitalization (including imaging, microbiology, ancillary and laboratory) are listed below for reference.    Significant Diagnostic Studies: Ct Abdomen Pelvis W Contrast  04/27/2014   CLINICAL DATA:  Patient with abdominal pain and diarrhea.  EXAM: CT  ABDOMEN AND PELVIS WITH CONTRAST  TECHNIQUE: Multidetector CT imaging of the abdomen and pelvis was performed using the standard protocol following bolus administration of intravenous contrast.  CONTRAST:  140mL OMNIPAQUE IOHEXOL 300 MG/ML  SOLN  COMPARISON:  CT 04/28/2012; 01/21/2011.  FINDINGS: Visualization of the lower thorax demonstrates small bilateral pleural effusions. Consolidative opacity within the dependent right lower lobe, nonspecific, potentially representing atelectasis. Infection could have a similar appearance.  The liver is normal in size and contour without focal hepatic lesion identified. Stable mild intrahepatic and extrahepatic biliary ductal dilatation. Status post cholecystectomy. Spleen is unremarkable. Normal bilateral adrenal glands. Kidneys enhance symmetrically with contrast. No hydronephrosis. Urinary bladder is unremarkable.  Infrarenal abdominal aortic ectasia measuring 2.2 cm. Scattered calcified atherosclerotic plaque involving the abdominal aorta  and iliac vessels.  Rectal tube is in place. There is perirectal fat stranding and a small amount of fluid within the pelvis. The colon is decompressed, limiting evaluation however there is fairly diffuse colonic wall thickening, mostly involving the descending and sigmoid colon. Oral contrast material within the small bowel. No evidence for small bowel obstruction. Mildly prominent small-bowel loops within the left upper quadrant. No free fluid or free intraperitoneal air. Stable postsurgical changes involving the stomach.  No aggressive or acute appearing osseous lesions. Lower lumbar spine degenerative change. Left lateral chest wall lipoma.  IMPRESSION: Diffuse wall thickening of the colon, most pronounced involving the descending and sigmoid colon, compatible with colitis. Considerations include infectious, inflammatory or ischemic etiologies. Consider correlation with colonoscopy after resolution of the acute symptomatology to  exclude underlying mass.  Prominence of the small bowel loops within the left upper quadrant may represent associated ileus.  Small bilateral pleural effusions. Consolidative opacity within the right lower lobe may represent atelectasis. Infection is not excluded. Recommend radiographic followup to ensure resolution.  Unchanged mild intrahepatic and extrahepatic biliary ductal dilatation status post cholecystectomy.  These results will be called to the ordering clinician or representative by the Radiologist Assistant, and communication documented in the PACS or zVision Dashboard.   Electronically Signed   By: Lovey Newcomer M.D.   On: 04/27/2014 13:14   US Venous Img Lower Bilateral  04/27/2014   CLINICAL DATA:  Left lower extremity edema and right lower extremity pain.  EXAM: BILATERAL LOWER EXTREMITY VENOUS DOPPLER ULTRASOUND  TECHNIQUE: Gray-scale sonography with graded compression, as well as color Doppler and duplex ultrasound were performed to evaluate the lower extremity deep venous systems from the level of the common femoral vein and including the common femoral, femoral, profunda femoral, popliteal and calf veins including the posterior tibial, peroneal and gastrocnemius veins when visible. The superficial great saphenous vein was also interrogated. Spectral Doppler was utilized to evaluate flow at rest and with distal augmentation maneuvers in the common femoral, femoral and popliteal veins.  COMPARISON:  None.  FINDINGS: RIGHT LOWER EXTREMITY  Common Femoral Vein: No evidence of thrombus. Normal compressibility, respiratory phasicity and response to augmentation.  Saphenofemoral Junction: No evidence of thrombus. Normal compressibility and flow on color Doppler imaging.  Profunda Femoral Vein: No evidence of thrombus. Normal compressibility and flow on color Doppler imaging.  Femoral Vein: No evidence of thrombus. Normal compressibility, respiratory phasicity and response to augmentation.  Popliteal  Vein: No evidence of thrombus. Normal compressibility, respiratory phasicity and response to augmentation.  Calf Veins: No evidence of thrombus. Normal compressibility and flow on color Doppler imaging.  Superficial Great Saphenous Vein: No evidence of thrombus. Normal compressibility and flow on color Doppler imaging.  Venous Reflux:  None.  Other Findings: No evidence of superficial thrombophlebitis or abnormal fluid collection.  LEFT LOWER EXTREMITY  Common Femoral Vein: No evidence of thrombus. Normal compressibility, respiratory phasicity and response to augmentation.  Saphenofemoral Junction: No evidence of thrombus. Normal compressibility and flow on color Doppler imaging.  Profunda Femoral Vein: No evidence of thrombus. Normal compressibility and flow on color Doppler imaging.  Femoral Vein: No evidence of thrombus. Normal compressibility, respiratory phasicity and response to augmentation.  Popliteal Vein: No evidence of thrombus. Normal compressibility, respiratory phasicity and response to augmentation.  Calf Veins: No evidence of thrombus. Normal compressibility and flow on color Doppler imaging.  Superficial Great Saphenous Vein: No evidence of thrombus. Normal compressibility and flow on color Doppler imaging.  Venous Reflux:  None.  Other Findings: No evidence of superficial thrombophlebitis or abnormal fluid collection.  IMPRESSION: No evidence of bilateral lower extremity deep venous thrombosis.   Electronically Signed   By: Aletta Edouard M.D.   On: 04/27/2014 10:32   Dg Abd Acute W/chest  04/26/2014   CLINICAL DATA:  Diarrhea.  EXAM: ACUTE ABDOMEN SERIES (ABDOMEN 2 VIEW & CHEST 1 VIEW)  COMPARISON:  March 29, 2014.  FINDINGS: There is no evidence of dilated bowel loops or free intraperitoneal air. Status post cholecystectomy. Surgical clips are noted in the left upper quadrant pelvis is well. Phleboliths are noted in the pelvis. Heart size and mediastinal contours are within normal limits.  Both lungs are clear.  IMPRESSION: No evidence of bowel obstruction or ileus. No acute cardiopulmonary abnormality seen.   Electronically Signed   By: Sabino Dick M.D.   On: 04/26/2014 18:30    Microbiology: Recent Results (from the past 240 hour(s))  Clostridium Difficile by PCR     Status: None   Collection Time: 04/26/14  4:10 PM  Result Value Ref Range Status   C difficile by pcr NEGATIVE NEGATIVE Final  MRSA PCR Screening     Status: None   Collection Time: 04/26/14  8:40 PM  Result Value Ref Range Status   MRSA by PCR NEGATIVE NEGATIVE Final    Comment:        The GeneXpert MRSA Assay (FDA approved for NASAL specimens only), is one component of a comprehensive MRSA colonization surveillance program. It is not intended to diagnose MRSA infection nor to guide or monitor treatment for MRSA infections.   Culture, Urine     Status: None   Collection Time: 04/27/14  5:40 AM  Result Value Ref Range Status   Specimen Description URINE, CATHETERIZED  Final   Special Requests NONE  Final   Culture  Setup Time   Final    04/27/2014 14:55 Performed at [place] Performed at [facility]   Final   Culture NO GROWTH Performed at [facility]   Final   Report Status 04/28/2014 FINAL  Final     Labs: Basic Metabolic Panel:  Recent Labs Lab 04/26/14 1705 04/27/14 0500 04/28/14 0534 04/30/14 0600  NA 138 141 139 142  K 4.8 4.6 3.4* 3.2*  CL 104 111 111 113*  CO2 22 15* 18* 19  GLUCOSE 102* 77 81 77  BUN 21 16 9  3*  CREATININE 1.31* 0.82 0.59 0.49*  CALCIUM 7.8* 6.9* 6.2* 7.0*   Liver Function Tests:  Recent Labs Lab 04/26/14 1705 04/27/14 0500 04/28/14 0534 04/30/14 0600  AST 51* 43* 17 16  ALT 27 25 19 17   ALKPHOS 103 94 80 85  BILITOT 0.6 0.4 0.2* 0.2*  PROT 4.7* 4.4* 3.6* 3.7*  ALBUMIN 2.3* 2.0* 1.7* 1.6*    Recent Labs Lab 04/26/14 1705  LIPASE 41   No results for input(s): AMMONIA in the  last 168 hours. CBC:  Recent Labs Lab 04/26/14 1705 04/27/14 0500 04/28/14 0534 04/30/14 0600  WBC 18.3* 22.3* 15.4* 6.7  NEUTROABS 16.5*  --   --   --   HGB 10.2* 10.6* 9.0* 8.7*  HCT 30.1* 32.8* 27.0* 26.0*  MCV 86.5 88.9 87.9 88.7  PLT 273 265 241 250   Cardiac Enzymes:  Recent Labs Lab 04/26/14 1705  TROPONINI <0.30   BNP: BNP (last 3 results) No results for input(s): PROBNP in the last 8760 hours.  CBG: No results for input(s): GLUCAP in the last 168 hours.     Signed:  Velvet Bathe  [facility] 04/30/2014, 3:57 PM

## 2014-04-30 NOTE — Progress Notes (Signed)
Physical Therapy Treatment Patient Details Name: [name] MRN: [id number] DOB: 08/15/1940 Today's Date: 04/30/2014    History of Present Illness Pt is a 73 year old female with c/o diarrhea and generalized weakness.  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  She lives with her husband and brother and over the past 2 weeks has been needinig increasing assistance with ADLs.  She normally ambulates with a walker independently.  She is diagnosed with colitis and currently has a rectal tube as well as foley catheter in place.  She has just moved up to [address] from ICU.    PT Comments    Pt reports fatigue this afternoon.  She states that she has been up in the chair this AM.  She was, however, cooperative and able to follow all directions.  She was instructed in sit to stand as an exercise and was able to do x 8 repetitions.  Instruction was given for anterior weight shift and hand placement.  Ease of transfer improved with subsequent repetitions.  She was also instructed in standing exercises but because of right knee pain in weight bearing she was only able to do a minimal amount of exercise. She was instructed in gait with a walker with cues for improved hip/knee flexion on swing through.  Endurance was only minimally improved.  Pt will return home at d/c with home health per her preference.  Husband was present at end of treatment and we discussed safety at home.  Follow Up Recommendations  Home health PT     Equipment Recommendations  None recommended by PT    Recommendations for Other Services  none     Precautions / Restrictions Precautions Precautions: Fall Restrictions Weight Bearing Restrictions: No    Mobility  Bed Mobility Overal bed mobility: Needs Assistance Bed Mobility: Sit to Supine     Supine to sit: Min assist Sit to supine: Min assist   General bed mobility comments: assist needed to move LEs in the  bed  Transfers Overall transfer level: Needs assistance Equipment used: Rolling walker (2 wheeled) Transfers: Sit to/from Stand Sit to Stand: Mod assist         General transfer comment: pt instructed in anterior weight shift and in proper hand placement during transfer... sit to stand done 8 repetitions  Ambulation/Gait Ambulation/Gait assistance: Min guard Ambulation Distance (Feet): 70 Feet Assistive device: Rolling walker (2 wheeled) Gait Pattern/deviations: Shuffle;Trunk flexed   Gait velocity interpretation: Below normal speed for age/gender     Stairs            Wheelchair Mobility    Modified Rankin (Stroke Patients Only)       Balance Overall balance assessment: Needs assistance Sitting-balance support: No upper extremity supported;Feet supported Sitting balance-Leahy Scale: Good     Standing balance support: Bilateral upper extremity supported Standing balance-Leahy Scale: Fair                      Cognition Arousal/Alertness: Awake/alert Behavior During Therapy: WFL for tasks assessed/performed Overall Cognitive Status: Within Functional Limits for tasks assessed                      Exercises General Exercises - Lower Extremity Toe Raises: AROM;10 reps;Standing Mini-Sqauts: AROM;5 reps;Standing Other Exercises Other Exercises: sit to stand x 8 repetitions    General Comments        Pertinent Vitals/Pain Pain Assessment: No/denies  pain    Home Living                      Prior Function            PT Goals (current goals can now be found in the care plan section) Progress towards PT goals: Progressing toward goals    Frequency       PT Plan Discharge plan needs to be updated    Co-evaluation             End of Session Equipment Utilized During Treatment: Gait belt Activity Tolerance: Patient limited by fatigue Patient left: in bed;with call bell/phone within reach;with bed alarm set;with  family/visitor present     Time: 6226-3335 PT Time Calculation (min) (ACUTE ONLY): 36 min  Charges:  $Gait Training: 8-22 mins $Therapeutic Exercise: 8-22 mins                    G Codes:      Sable Feil 05-17-2014, 2:49 PM

## 2014-04-30 NOTE — Clinical Social Work Note (Signed)
Pt referred for SNF, but pt and family have decided pt will return home with home health. CSW will sign off, but can be reconsulted if needed.  Benay Pike, [place]

## 2014-04-30 NOTE — Progress Notes (Signed)
ANTIBIOTIC CONSULT NOTE  Pharmacy Consult for Cefepime Indication: intra-abdominal infection  Allergies  Allergen Reactions  . Levaquin [Levofloxacin In D5w] Swelling    To mouth  . Macrobid [Nitrofurantoin Macrocrystal]     Patient can't remember reaction.   Patient Measurements: Height: 5\' 2"  (157.5 cm) Weight: 180 lb 8.9 oz (81.9 kg) IBW/kg (Calculated) : 50.1  Vital Signs: Temp: 98.9 F (37.2 C) (12/18 0523) Temp Source: Oral (12/18 0523) BP: 121/54 mmHg (12/18 0523) Pulse Rate: 82 (12/18 0523) Intake/Output from previous day: 12/17 0701 - 12/18 0700 In: 360 [P.O.:360] Out: 1325 [Urine:1275; Stool:50] Intake/Output from this shift:    Labs:  Recent Labs  04/28/14 0534 04/30/14 0600  WBC 15.4* 6.7  HGB 9.0* 8.7*  PLT 241 250  CREATININE 0.59 0.49*   Estimated Creatinine Clearance: 62.1 mL/min (by C-G formula based on Cr of 0.49). No results for input(s): VANCOTROUGH, VANCOPEAK, VANCORANDOM, GENTTROUGH, GENTPEAK, GENTRANDOM, TOBRATROUGH, TOBRAPEAK, TOBRARND, AMIKACINPEAK, AMIKACINTROU, AMIKACIN in the last 72 hours.   Microbiology: Recent Results (from the past 720 hour(s))  Clostridium Difficile by PCR     Status: None   Collection Time: 04/26/14  4:10 PM  Result Value Ref Range Status   C difficile by pcr NEGATIVE NEGATIVE Final  MRSA PCR Screening     Status: None   Collection Time: 04/26/14  8:40 PM  Result Value Ref Range Status   MRSA by PCR NEGATIVE NEGATIVE Final    Comment:        The GeneXpert MRSA Assay (FDA approved for NASAL specimens only), is one component of a comprehensive MRSA colonization surveillance program. It is not intended to diagnose MRSA infection nor to guide or monitor treatment for MRSA infections.   Culture, Urine     Status: None   Collection Time: 04/27/14  5:40 AM  Result Value Ref Range Status   Specimen Description URINE, CATHETERIZED  Final   Special Requests NONE  Final   Culture  Setup Time   Final   04/27/2014 14:55 Performed at [facility] Performed at [facility]   Final   Culture NO GROWTH Performed at [facility]   Final   Report Status 04/28/2014 FINAL  Final    Anti-infectives    Start     Dose/Rate Route Frequency Ordered Stop   04/27/14 1000  ceFEPIme (MAXIPIME) 1 g in dextrose 5 % 50 mL IVPB     1 g100 mL/hr over 30 Minutes Intravenous Every 8 hours 04/27/14 0931     04/27/14 0800  metroNIDAZOLE (FLAGYL) IVPB 500 mg     500 mg100 mL/hr over 60 Minutes Intravenous Every 8 hours 04/27/14 0740       Assessment: 23 yoF admitted with diarrhea.  Cdiff negative.  WBC improved.  On Cefepime and Flagyl for intra-abdominal infection.  Rectal tube still in place.  Still has diarrhea.  GI pathogen panel is pending. Renal function at patient's baseline.  Afebrile.    Cefepime 12/15>> Flagyl 12/15>>  Goal of Therapy:  Eradicate infection.  Plan:  Continue Cefepime 1gm IV q8h Continue Flagyl per MD Monitor progress, renal function and cx data   Hart Robinsons A 04/30/2014,12:04 PM

## 2014-05-01 NOTE — Progress Notes (Signed)
Discharge instruction reviewed with patient and patient's spouse. Foley catheter removed by Nurse Tech. No distress noted. Prescription given to patient. Home Health to follow patient at home.

## 2014-05-02 DIAGNOSIS — R198 Other specified symptoms and signs involving the digestive system and abdomen: Secondary | ICD-10-CM | POA: Diagnosis not present

## 2014-05-02 DIAGNOSIS — K219 Gastro-esophageal reflux disease without esophagitis: Secondary | ICD-10-CM | POA: Diagnosis not present

## 2014-05-02 DIAGNOSIS — F419 Anxiety disorder, unspecified: Secondary | ICD-10-CM | POA: Diagnosis not present

## 2014-05-02 DIAGNOSIS — J449 Chronic obstructive pulmonary disease, unspecified: Secondary | ICD-10-CM | POA: Diagnosis not present

## 2014-05-02 DIAGNOSIS — M797 Fibromyalgia: Secondary | ICD-10-CM | POA: Diagnosis not present

## 2014-05-02 DIAGNOSIS — R2689 Other abnormalities of gait and mobility: Secondary | ICD-10-CM | POA: Diagnosis not present

## 2014-05-02 DIAGNOSIS — E860 Dehydration: Secondary | ICD-10-CM | POA: Diagnosis not present

## 2014-05-02 DIAGNOSIS — M47816 Spondylosis without myelopathy or radiculopathy, lumbar region: Secondary | ICD-10-CM | POA: Diagnosis not present

## 2014-05-02 DIAGNOSIS — I69320 Aphasia following cerebral infarction: Secondary | ICD-10-CM | POA: Diagnosis not present

## 2014-05-02 DIAGNOSIS — I69390 Apraxia following cerebral infarction: Secondary | ICD-10-CM | POA: Diagnosis not present

## 2014-05-02 DIAGNOSIS — M810 Age-related osteoporosis without current pathological fracture: Secondary | ICD-10-CM | POA: Diagnosis not present

## 2014-05-03 ENCOUNTER — Telehealth: Payer: Self-pay | Admitting: Family Medicine

## 2014-05-03 NOTE — Telephone Encounter (Signed)
Dr Richardson Landry recommends office visit to evaluate. Office visit scheduled.

## 2014-05-03 NOTE — Telephone Encounter (Signed)
Patient was inpatient 04/26/14-05/01/14

## 2014-05-03 NOTE — Telephone Encounter (Signed)
Patient had a recent hospitalization due to a fall.  Now, both of her legs are swelling and wants to know if this is normal?

## 2014-05-04 ENCOUNTER — Encounter: Payer: Self-pay | Admitting: Family Medicine

## 2014-05-04 ENCOUNTER — Ambulatory Visit (INDEPENDENT_AMBULATORY_CARE_PROVIDER_SITE_OTHER): Payer: Medicare Other | Admitting: Family Medicine

## 2014-05-04 VITALS — BP 110/80 | Temp 98.6°F | Ht 62.0 in

## 2014-05-04 DIAGNOSIS — E876 Hypokalemia: Secondary | ICD-10-CM | POA: Diagnosis not present

## 2014-05-04 DIAGNOSIS — I639 Cerebral infarction, unspecified: Secondary | ICD-10-CM

## 2014-05-04 DIAGNOSIS — I10 Essential (primary) hypertension: Secondary | ICD-10-CM | POA: Diagnosis not present

## 2014-05-04 DIAGNOSIS — R2689 Other abnormalities of gait and mobility: Secondary | ICD-10-CM | POA: Diagnosis not present

## 2014-05-04 DIAGNOSIS — R198 Other specified symptoms and signs involving the digestive system and abdomen: Secondary | ICD-10-CM | POA: Diagnosis not present

## 2014-05-04 DIAGNOSIS — Z79899 Other long term (current) drug therapy: Secondary | ICD-10-CM | POA: Diagnosis not present

## 2014-05-04 DIAGNOSIS — E860 Dehydration: Secondary | ICD-10-CM | POA: Diagnosis not present

## 2014-05-04 DIAGNOSIS — K219 Gastro-esophageal reflux disease without esophagitis: Secondary | ICD-10-CM

## 2014-05-04 DIAGNOSIS — M47816 Spondylosis without myelopathy or radiculopathy, lumbar region: Secondary | ICD-10-CM | POA: Diagnosis not present

## 2014-05-04 DIAGNOSIS — I69390 Apraxia following cerebral infarction: Secondary | ICD-10-CM | POA: Diagnosis not present

## 2014-05-04 NOTE — Care Management Note (Signed)
UR complete 

## 2014-05-04 NOTE — Patient Instructions (Signed)
Take carnation instant breakfast twice per day

## 2014-05-04 NOTE — Progress Notes (Signed)
   Subjective:    Patient ID: COZY VEALE, female    DOB: [birth year], 73 y.o.   MRN: [id number]  HPI Patient is here today for bilateral leg swelling. This has been present for over 1 week now. Patient was [facility] at [facility] last week for diarrhea and a fall. Patient is currently on antibiotics post hospitalization. Patient states she has no other concerns at this time.   Of note patient developed substantially low blood pressure. Lisinopril was stopped. This was held upon discharge.  Continues to note some leg swelling. History of some nutritional challenges.   Reports diarrhea has improved. Was advised to finish metronidazole and Augmentin. Now finishing  Review of Systems No headache no chest pain no back pain no change in bowel habits no blood in stool    Objective:   Physical Exam Alert vitals stable blood pressure good on repeat 136 or 84 lungs clear heart rare rhythm abdomen benign soft good bowel sounds ankles 1+ edema       Assessment & Plan:  Impression 1 status post gastroenteritis with dehydration clinically improved #2 hypertension fair control off medications. #3 colitis equivalent to follow-up with GI docs plan check met 7. Hold off on lisinopril. Follow-up as scheduled. Warning signs discussed. WSL

## 2014-05-05 DIAGNOSIS — M47816 Spondylosis without myelopathy or radiculopathy, lumbar region: Secondary | ICD-10-CM | POA: Diagnosis not present

## 2014-05-05 DIAGNOSIS — R2689 Other abnormalities of gait and mobility: Secondary | ICD-10-CM | POA: Diagnosis not present

## 2014-05-05 DIAGNOSIS — I69390 Apraxia following cerebral infarction: Secondary | ICD-10-CM | POA: Diagnosis not present

## 2014-05-05 DIAGNOSIS — R198 Other specified symptoms and signs involving the digestive system and abdomen: Secondary | ICD-10-CM | POA: Diagnosis not present

## 2014-05-05 DIAGNOSIS — K219 Gastro-esophageal reflux disease without esophagitis: Secondary | ICD-10-CM | POA: Diagnosis not present

## 2014-05-05 DIAGNOSIS — E860 Dehydration: Secondary | ICD-10-CM | POA: Diagnosis not present

## 2014-05-06 ENCOUNTER — Other Ambulatory Visit: Payer: Self-pay | Admitting: Family Medicine

## 2014-05-06 DIAGNOSIS — R198 Other specified symptoms and signs involving the digestive system and abdomen: Secondary | ICD-10-CM | POA: Diagnosis not present

## 2014-05-06 DIAGNOSIS — I69390 Apraxia following cerebral infarction: Secondary | ICD-10-CM | POA: Diagnosis not present

## 2014-05-06 DIAGNOSIS — E860 Dehydration: Secondary | ICD-10-CM | POA: Diagnosis not present

## 2014-05-06 DIAGNOSIS — M47816 Spondylosis without myelopathy or radiculopathy, lumbar region: Secondary | ICD-10-CM | POA: Diagnosis not present

## 2014-05-06 DIAGNOSIS — R2689 Other abnormalities of gait and mobility: Secondary | ICD-10-CM | POA: Diagnosis not present

## 2014-05-06 DIAGNOSIS — K219 Gastro-esophageal reflux disease without esophagitis: Secondary | ICD-10-CM | POA: Diagnosis not present

## 2014-05-08 DIAGNOSIS — M47816 Spondylosis without myelopathy or radiculopathy, lumbar region: Secondary | ICD-10-CM | POA: Diagnosis not present

## 2014-05-08 DIAGNOSIS — R198 Other specified symptoms and signs involving the digestive system and abdomen: Secondary | ICD-10-CM | POA: Diagnosis not present

## 2014-05-08 DIAGNOSIS — R2689 Other abnormalities of gait and mobility: Secondary | ICD-10-CM | POA: Diagnosis not present

## 2014-05-08 DIAGNOSIS — I69390 Apraxia following cerebral infarction: Secondary | ICD-10-CM | POA: Diagnosis not present

## 2014-05-08 DIAGNOSIS — E860 Dehydration: Secondary | ICD-10-CM | POA: Diagnosis not present

## 2014-05-08 DIAGNOSIS — K219 Gastro-esophageal reflux disease without esophagitis: Secondary | ICD-10-CM | POA: Diagnosis not present

## 2014-05-10 DIAGNOSIS — R2689 Other abnormalities of gait and mobility: Secondary | ICD-10-CM | POA: Diagnosis not present

## 2014-05-10 DIAGNOSIS — K219 Gastro-esophageal reflux disease without esophagitis: Secondary | ICD-10-CM | POA: Diagnosis not present

## 2014-05-10 DIAGNOSIS — M47816 Spondylosis without myelopathy or radiculopathy, lumbar region: Secondary | ICD-10-CM | POA: Diagnosis not present

## 2014-05-10 DIAGNOSIS — I69390 Apraxia following cerebral infarction: Secondary | ICD-10-CM | POA: Diagnosis not present

## 2014-05-10 DIAGNOSIS — R198 Other specified symptoms and signs involving the digestive system and abdomen: Secondary | ICD-10-CM | POA: Diagnosis not present

## 2014-05-10 DIAGNOSIS — E860 Dehydration: Secondary | ICD-10-CM | POA: Diagnosis not present

## 2014-05-10 NOTE — Care Management Utilization Note (Signed)
UR complete 

## 2014-05-11 DIAGNOSIS — K219 Gastro-esophageal reflux disease without esophagitis: Secondary | ICD-10-CM | POA: Diagnosis not present

## 2014-05-11 DIAGNOSIS — M47816 Spondylosis without myelopathy or radiculopathy, lumbar region: Secondary | ICD-10-CM | POA: Diagnosis not present

## 2014-05-11 DIAGNOSIS — I69390 Apraxia following cerebral infarction: Secondary | ICD-10-CM | POA: Diagnosis not present

## 2014-05-11 DIAGNOSIS — R198 Other specified symptoms and signs involving the digestive system and abdomen: Secondary | ICD-10-CM | POA: Diagnosis not present

## 2014-05-11 DIAGNOSIS — E860 Dehydration: Secondary | ICD-10-CM | POA: Diagnosis not present

## 2014-05-11 DIAGNOSIS — R2689 Other abnormalities of gait and mobility: Secondary | ICD-10-CM | POA: Diagnosis not present

## 2014-05-13 DIAGNOSIS — K219 Gastro-esophageal reflux disease without esophagitis: Secondary | ICD-10-CM | POA: Diagnosis not present

## 2014-05-13 DIAGNOSIS — M47816 Spondylosis without myelopathy or radiculopathy, lumbar region: Secondary | ICD-10-CM | POA: Diagnosis not present

## 2014-05-13 DIAGNOSIS — E860 Dehydration: Secondary | ICD-10-CM | POA: Diagnosis not present

## 2014-05-13 DIAGNOSIS — I69390 Apraxia following cerebral infarction: Secondary | ICD-10-CM | POA: Diagnosis not present

## 2014-05-13 DIAGNOSIS — R198 Other specified symptoms and signs involving the digestive system and abdomen: Secondary | ICD-10-CM | POA: Diagnosis not present

## 2014-05-13 DIAGNOSIS — R2689 Other abnormalities of gait and mobility: Secondary | ICD-10-CM | POA: Diagnosis not present

## 2014-05-14 DIAGNOSIS — K219 Gastro-esophageal reflux disease without esophagitis: Secondary | ICD-10-CM | POA: Diagnosis not present

## 2014-05-14 DIAGNOSIS — R2689 Other abnormalities of gait and mobility: Secondary | ICD-10-CM | POA: Diagnosis not present

## 2014-05-14 DIAGNOSIS — R198 Other specified symptoms and signs involving the digestive system and abdomen: Secondary | ICD-10-CM | POA: Diagnosis not present

## 2014-05-14 DIAGNOSIS — M47816 Spondylosis without myelopathy or radiculopathy, lumbar region: Secondary | ICD-10-CM | POA: Diagnosis not present

## 2014-05-14 DIAGNOSIS — I69390 Apraxia following cerebral infarction: Secondary | ICD-10-CM | POA: Diagnosis not present

## 2014-05-14 DIAGNOSIS — E860 Dehydration: Secondary | ICD-10-CM | POA: Diagnosis not present

## 2014-05-17 DIAGNOSIS — E860 Dehydration: Secondary | ICD-10-CM | POA: Diagnosis not present

## 2014-05-17 DIAGNOSIS — M47816 Spondylosis without myelopathy or radiculopathy, lumbar region: Secondary | ICD-10-CM | POA: Diagnosis not present

## 2014-05-17 DIAGNOSIS — R2689 Other abnormalities of gait and mobility: Secondary | ICD-10-CM | POA: Diagnosis not present

## 2014-05-17 DIAGNOSIS — I69390 Apraxia following cerebral infarction: Secondary | ICD-10-CM | POA: Diagnosis not present

## 2014-05-17 DIAGNOSIS — R198 Other specified symptoms and signs involving the digestive system and abdomen: Secondary | ICD-10-CM | POA: Diagnosis not present

## 2014-05-17 DIAGNOSIS — K219 Gastro-esophageal reflux disease without esophagitis: Secondary | ICD-10-CM | POA: Diagnosis not present

## 2014-05-18 ENCOUNTER — Ambulatory Visit: Payer: Medicare Other | Admitting: Family Medicine

## 2014-05-18 ENCOUNTER — Encounter: Payer: Self-pay | Admitting: Family Medicine

## 2014-05-18 VITALS — BP 122/82 | Ht 62.0 in | Wt 172.8 lb

## 2014-05-18 DIAGNOSIS — K219 Gastro-esophageal reflux disease without esophagitis: Secondary | ICD-10-CM

## 2014-05-18 DIAGNOSIS — I10 Essential (primary) hypertension: Secondary | ICD-10-CM

## 2014-05-18 DIAGNOSIS — R198 Other specified symptoms and signs involving the digestive system and abdomen: Secondary | ICD-10-CM

## 2014-05-18 DIAGNOSIS — R2689 Other abnormalities of gait and mobility: Secondary | ICD-10-CM

## 2014-05-18 DIAGNOSIS — E860 Dehydration: Secondary | ICD-10-CM

## 2014-05-18 NOTE — Progress Notes (Signed)
   Subjective:    Patient ID: [name], female    DOB: [birth year], 74 y.o.   MRN: [id number]  HPI Patient arrives for a follow up on swelling in her legs. Patient states the swelling is doing some better. Patient states appetite overall has improved. Has cut down salt in diet. Has cut down fats.  Reports compliance with blood pressure medication. No obvious side effects watching salt intake  No further loose stools to get follow-up blood work is requested.  Review of Systems No headache no chest pain no back pain no abdominal pain no change in bowel habits no blood in stool    Objective:   Physical Exam  Alert no apparent distress lungs clear heart rare rhythm H&T normal blood pressure good on repeat ankles trace edema      Assessment & Plan:  Impression 1 hypertension stable #2 status post Road #3 reflux stable plan maintain same medications. Diet exercise discussed plan of note blood pressure still good with current regimen. Follow-up as scheduled. WSL

## 2014-05-19 DIAGNOSIS — K219 Gastro-esophageal reflux disease without esophagitis: Secondary | ICD-10-CM | POA: Diagnosis not present

## 2014-05-19 DIAGNOSIS — I69390 Apraxia following cerebral infarction: Secondary | ICD-10-CM | POA: Diagnosis not present

## 2014-05-19 DIAGNOSIS — E860 Dehydration: Secondary | ICD-10-CM | POA: Diagnosis not present

## 2014-05-19 DIAGNOSIS — R2689 Other abnormalities of gait and mobility: Secondary | ICD-10-CM | POA: Diagnosis not present

## 2014-05-19 DIAGNOSIS — R198 Other specified symptoms and signs involving the digestive system and abdomen: Secondary | ICD-10-CM | POA: Diagnosis not present

## 2014-05-19 DIAGNOSIS — M47816 Spondylosis without myelopathy or radiculopathy, lumbar region: Secondary | ICD-10-CM | POA: Diagnosis not present

## 2014-05-21 ENCOUNTER — Telehealth: Payer: Self-pay | Admitting: Gastroenterology

## 2014-05-21 DIAGNOSIS — R2689 Other abnormalities of gait and mobility: Secondary | ICD-10-CM | POA: Diagnosis not present

## 2014-05-21 DIAGNOSIS — I69390 Apraxia following cerebral infarction: Secondary | ICD-10-CM | POA: Diagnosis not present

## 2014-05-21 DIAGNOSIS — M47816 Spondylosis without myelopathy or radiculopathy, lumbar region: Secondary | ICD-10-CM | POA: Diagnosis not present

## 2014-05-21 DIAGNOSIS — R198 Other specified symptoms and signs involving the digestive system and abdomen: Secondary | ICD-10-CM | POA: Diagnosis not present

## 2014-05-21 DIAGNOSIS — E860 Dehydration: Secondary | ICD-10-CM | POA: Diagnosis not present

## 2014-05-21 DIAGNOSIS — K219 Gastro-esophageal reflux disease without esophagitis: Secondary | ICD-10-CM | POA: Diagnosis not present

## 2014-05-21 NOTE — Telephone Encounter (Signed)
Patient needs hospital f/u, likely needs TCS+/-EGD

## 2014-05-24 ENCOUNTER — Encounter: Payer: Self-pay | Admitting: Gastroenterology

## 2014-05-24 NOTE — Telephone Encounter (Signed)
APPOINTMENT MADE AND LETTER SENT °

## 2014-06-09 DIAGNOSIS — R198 Other specified symptoms and signs involving the digestive system and abdomen: Secondary | ICD-10-CM | POA: Diagnosis not present

## 2014-06-09 DIAGNOSIS — K219 Gastro-esophageal reflux disease without esophagitis: Secondary | ICD-10-CM | POA: Diagnosis not present

## 2014-06-09 DIAGNOSIS — I69390 Apraxia following cerebral infarction: Secondary | ICD-10-CM | POA: Diagnosis not present

## 2014-06-09 DIAGNOSIS — E860 Dehydration: Secondary | ICD-10-CM | POA: Diagnosis not present

## 2014-06-09 DIAGNOSIS — R2689 Other abnormalities of gait and mobility: Secondary | ICD-10-CM | POA: Diagnosis not present

## 2014-06-09 DIAGNOSIS — M47816 Spondylosis without myelopathy or radiculopathy, lumbar region: Secondary | ICD-10-CM | POA: Diagnosis not present

## 2014-06-11 ENCOUNTER — Other Ambulatory Visit: Payer: Self-pay | Admitting: Family Medicine

## 2014-06-22 ENCOUNTER — Ambulatory Visit: Payer: Medicare Other | Admitting: Gastroenterology

## 2014-06-25 ENCOUNTER — Telehealth: Payer: Self-pay | Admitting: Family Medicine

## 2014-06-25 NOTE — Telephone Encounter (Signed)
Pt's hydrOXYzine (ATARAX/VISTARIL) 25 MG tablet is Non-Formulary, please see letter in green folder with alternatives listed.  Change med or try to get formulary exception?  Please advise

## 2014-06-29 ENCOUNTER — Ambulatory Visit: Payer: Medicare Other | Admitting: Family Medicine

## 2014-07-02 ENCOUNTER — Ambulatory Visit: Payer: Medicare Other | Admitting: Family Medicine

## 2014-07-05 ENCOUNTER — Encounter: Payer: Self-pay | Admitting: Family Medicine

## 2014-07-05 ENCOUNTER — Ambulatory Visit (INDEPENDENT_AMBULATORY_CARE_PROVIDER_SITE_OTHER): Payer: Medicare Other | Admitting: Family Medicine

## 2014-07-05 VITALS — BP 132/84 | Ht 62.0 in | Wt 173.0 lb

## 2014-07-05 DIAGNOSIS — M25511 Pain in right shoulder: Secondary | ICD-10-CM

## 2014-07-05 DIAGNOSIS — M25561 Pain in right knee: Secondary | ICD-10-CM | POA: Diagnosis not present

## 2014-07-05 MED ORDER — HYDROCODONE-ACETAMINOPHEN 5-325 MG PO TABS: ORAL_TABLET | ORAL | Status: DC

## 2014-07-05 MED ORDER — ALPRAZOLAM 1 MG PO TABS: 1.0000 mg | ORAL_TABLET | Freq: Two times a day (BID) | ORAL | Status: DC

## 2014-07-05 NOTE — Progress Notes (Signed)
   Subjective:    Patient ID: [name], female    DOB: Jun 02, 1940, 74 y.o.   MRN: [id number]  HPI Patient is here today for a 6 week check up on her leg swelling.  Legs still swell. Usually worse after standing for long periods of time.  Pt would like refills on her hydrocodone. Patient states she definitely needs her pain medicine. Notes severe ongoing problems with back pain. Also challenge with chronic knee pain right greater than left. Also now shoulder pain. States she actually needs something for pain.   and her xanax. Patient states Xanax as necessary for anxiety control.  Pt c/o right arm pain. Pain is worse in the right shoulder. Injured it with her last fall.  Pt still did not get BW done that was ordered at last visit.    Review of Systems    no headache no chest pain no change in bowel habits no blood in stool ROS otherwise negative Objective:   Physical Exam  Alert slight malaise. Right shoulder positive pain with rotation lungs clear heart rare rhythm low back tender to palpation right knee impressive crepitations and pain with extension      Assessment & Plan:  Impression 1 chronic pain ongoing and worse patient makes case for resuming medicine. Patient's husband will work with her closely to maintain #2 chronic and worsening knee pain #3 shoulder pain post injury #4 chronic back pain. #5 status post stroke #6 hypertension good control plan resume pain medication. Appropriate use discussed at length. Maintain same blood pressure medicine. Blood work to be done. WSL

## 2014-07-13 ENCOUNTER — Ambulatory Visit (HOSPITAL_COMMUNITY)
Admission: RE | Admit: 2014-07-13 | Discharge: 2014-07-13 | Disposition: A | Discharge status: Home / Self Care | Payer: Medicare Other | Source: Ambulatory Visit | Attending: Family Medicine | Admitting: Family Medicine

## 2014-07-13 DIAGNOSIS — R296 Repeated falls: Secondary | ICD-10-CM | POA: Insufficient documentation

## 2014-07-13 DIAGNOSIS — M25511 Pain in right shoulder: Secondary | ICD-10-CM | POA: Insufficient documentation

## 2014-07-13 DIAGNOSIS — M25561 Pain in right knee: Secondary | ICD-10-CM

## 2014-07-13 DIAGNOSIS — Z79899 Other long term (current) drug therapy: Secondary | ICD-10-CM | POA: Diagnosis not present

## 2014-07-13 DIAGNOSIS — M1711 Unilateral primary osteoarthritis, right knee: Secondary | ICD-10-CM | POA: Diagnosis not present

## 2014-07-13 DIAGNOSIS — R937 Abnormal findings on diagnostic imaging of other parts of musculoskeletal system: Secondary | ICD-10-CM | POA: Diagnosis not present

## 2014-07-13 DIAGNOSIS — M19011 Primary osteoarthritis, right shoulder: Secondary | ICD-10-CM | POA: Diagnosis not present

## 2014-07-14 LAB — HEPATIC FUNCTION PANEL
ALT: 12 U/L (ref 0–35)
AST: 20 U/L (ref 0–37)
Albumin: 3.9 g/dL (ref 3.5–5.2)
Alkaline Phosphatase: 50 U/L (ref 39–117)
Bilirubin, Direct: 0.1 mg/dL (ref 0.0–0.3)
Indirect Bilirubin: 0.3 mg/dL (ref 0.2–1.2)
TOTAL PROTEIN: 5.9 g/dL — AB (ref 6.0–8.3)
Total Bilirubin: 0.4 mg/dL (ref 0.2–1.2)

## 2014-07-14 LAB — BASIC METABOLIC PANEL
BUN: 18 mg/dL (ref 6–23)
CHLORIDE: 106 meq/L (ref 96–112)
CO2: 25 mEq/L (ref 19–32)
CREATININE: 0.71 mg/dL (ref 0.50–1.10)
Calcium: 9 mg/dL (ref 8.4–10.5)
Glucose, Bld: 79 mg/dL (ref 70–99)
Potassium: 4.5 mEq/L (ref 3.5–5.3)
Sodium: 139 mEq/L (ref 135–145)

## 2014-07-23 ENCOUNTER — Encounter: Payer: Self-pay | Admitting: Family Medicine

## 2014-07-27 ENCOUNTER — Other Ambulatory Visit: Payer: Self-pay | Admitting: *Deleted

## 2014-07-27 ENCOUNTER — Telehealth: Payer: Self-pay | Admitting: *Deleted

## 2014-07-27 MED ORDER — OFLOXACIN 0.3 % OT SOLN: OTIC | Status: DC

## 2014-07-27 NOTE — Telephone Encounter (Signed)
floxin five drops bid 7 d

## 2014-07-27 NOTE — Telephone Encounter (Signed)
That's ok.

## 2014-07-27 NOTE — Telephone Encounter (Signed)
Med sent to pharm 

## 2014-07-27 NOTE — Telephone Encounter (Signed)
Antipyrine-benzocaine eardrops. Instill 5 drops into affected ears 3 times a day is on manf back order. Pt needs something else? Please sent to [facility].

## 2014-07-27 NOTE — Telephone Encounter (Signed)
What med would you like it changed to.

## 2014-08-09 ENCOUNTER — Other Ambulatory Visit: Payer: Self-pay | Admitting: Family Medicine

## 2014-08-10 ENCOUNTER — Other Ambulatory Visit: Payer: Self-pay | Admitting: Family Medicine

## 2014-08-12 ENCOUNTER — Encounter: Payer: Self-pay | Admitting: Family Medicine

## 2014-08-13 NOTE — Telephone Encounter (Signed)
Per Dr. Richardson Landry - pt may purchase out of pocket, mailed letter 08/12/14 to pt explaining

## 2014-08-29 ENCOUNTER — Other Ambulatory Visit: Payer: Self-pay | Admitting: Family Medicine

## 2014-09-01 ENCOUNTER — Ambulatory Visit: Payer: Medicare Other | Admitting: Family Medicine

## 2014-09-02 ENCOUNTER — Emergency Department (HOSPITAL_COMMUNITY)
Admission: EM | Admit: 2014-09-02 | Discharge: 2014-09-02 | Disposition: A | Discharge status: Home / Self Care | Payer: Medicare Other | Attending: Emergency Medicine | Admitting: Emergency Medicine

## 2014-09-02 ENCOUNTER — Emergency Department (HOSPITAL_COMMUNITY): Payer: Medicare Other

## 2014-09-02 ENCOUNTER — Encounter (HOSPITAL_COMMUNITY): Payer: Self-pay | Admitting: Emergency Medicine

## 2014-09-02 DIAGNOSIS — Z720 Tobacco use: Secondary | ICD-10-CM | POA: Insufficient documentation

## 2014-09-02 DIAGNOSIS — S0990XA Unspecified injury of head, initial encounter: Secondary | ICD-10-CM | POA: Diagnosis not present

## 2014-09-02 DIAGNOSIS — J449 Chronic obstructive pulmonary disease, unspecified: Secondary | ICD-10-CM | POA: Diagnosis not present

## 2014-09-02 DIAGNOSIS — Y92007 Garden or yard of unspecified non-institutional (private) residence as the place of occurrence of the external cause: Secondary | ICD-10-CM | POA: Insufficient documentation

## 2014-09-02 DIAGNOSIS — S8991XA Unspecified injury of right lower leg, initial encounter: Secondary | ICD-10-CM | POA: Diagnosis not present

## 2014-09-02 DIAGNOSIS — F419 Anxiety disorder, unspecified: Secondary | ICD-10-CM | POA: Diagnosis not present

## 2014-09-02 DIAGNOSIS — M25461 Effusion, right knee: Secondary | ICD-10-CM | POA: Diagnosis not present

## 2014-09-02 DIAGNOSIS — E785 Hyperlipidemia, unspecified: Secondary | ICD-10-CM | POA: Insufficient documentation

## 2014-09-02 DIAGNOSIS — Y9389 Activity, other specified: Secondary | ICD-10-CM | POA: Diagnosis not present

## 2014-09-02 DIAGNOSIS — I10 Essential (primary) hypertension: Secondary | ICD-10-CM | POA: Diagnosis not present

## 2014-09-02 DIAGNOSIS — S60221A Contusion of right hand, initial encounter: Secondary | ICD-10-CM | POA: Diagnosis not present

## 2014-09-02 DIAGNOSIS — S60212A Contusion of left wrist, initial encounter: Secondary | ICD-10-CM | POA: Diagnosis not present

## 2014-09-02 DIAGNOSIS — Z79899 Other long term (current) drug therapy: Secondary | ICD-10-CM | POA: Insufficient documentation

## 2014-09-02 DIAGNOSIS — Y998 Other external cause status: Secondary | ICD-10-CM | POA: Diagnosis not present

## 2014-09-02 DIAGNOSIS — S61502A Unspecified open wound of left wrist, initial encounter: Secondary | ICD-10-CM | POA: Insufficient documentation

## 2014-09-02 DIAGNOSIS — T07XXXA Unspecified multiple injuries, initial encounter: Secondary | ICD-10-CM

## 2014-09-02 DIAGNOSIS — E039 Hypothyroidism, unspecified: Secondary | ICD-10-CM | POA: Insufficient documentation

## 2014-09-02 DIAGNOSIS — S8001XA Contusion of right knee, initial encounter: Secondary | ICD-10-CM | POA: Insufficient documentation

## 2014-09-02 DIAGNOSIS — K219 Gastro-esophageal reflux disease without esophagitis: Secondary | ICD-10-CM | POA: Diagnosis not present

## 2014-09-02 DIAGNOSIS — S3992XA Unspecified injury of lower back, initial encounter: Secondary | ICD-10-CM | POA: Diagnosis not present

## 2014-09-02 DIAGNOSIS — W010XXA Fall on same level from slipping, tripping and stumbling without subsequent striking against object, initial encounter: Secondary | ICD-10-CM | POA: Insufficient documentation

## 2014-09-02 DIAGNOSIS — S0993XA Unspecified injury of face, initial encounter: Secondary | ICD-10-CM | POA: Diagnosis not present

## 2014-09-02 DIAGNOSIS — M797 Fibromyalgia: Secondary | ICD-10-CM | POA: Diagnosis not present

## 2014-09-02 DIAGNOSIS — W19XXXA Unspecified fall, initial encounter: Secondary | ICD-10-CM

## 2014-09-02 DIAGNOSIS — Z8673 Personal history of transient ischemic attack (TIA), and cerebral infarction without residual deficits: Secondary | ICD-10-CM | POA: Insufficient documentation

## 2014-09-02 DIAGNOSIS — S60512A Abrasion of left hand, initial encounter: Secondary | ICD-10-CM | POA: Diagnosis not present

## 2014-09-02 DIAGNOSIS — R51 Headache: Secondary | ICD-10-CM | POA: Diagnosis not present

## 2014-09-02 DIAGNOSIS — S60812A Abrasion of left wrist, initial encounter: Secondary | ICD-10-CM | POA: Diagnosis not present

## 2014-09-02 DIAGNOSIS — S0083XA Contusion of other part of head, initial encounter: Secondary | ICD-10-CM | POA: Diagnosis not present

## 2014-09-02 DIAGNOSIS — S60222A Contusion of left hand, initial encounter: Secondary | ICD-10-CM | POA: Diagnosis not present

## 2014-09-02 DIAGNOSIS — S199XXA Unspecified injury of neck, initial encounter: Secondary | ICD-10-CM | POA: Insufficient documentation

## 2014-09-02 DIAGNOSIS — M545 Low back pain: Secondary | ICD-10-CM | POA: Diagnosis not present

## 2014-09-02 NOTE — ED Notes (Addendum)
Pt lost balance in yard and fell and c/o left hand pain. Pt states she struck her head, but denies any loc.

## 2014-09-02 NOTE — ED Notes (Signed)
Discharge instructions given and reviewed with patient.  Patient verbalized understanding to follow up with Dr. Wolfgang Phoenix as needed.  Patient ambulatory; discharged home in good condition.

## 2014-09-02 NOTE — ED Notes (Signed)
Patient ambulated independently with steady gait.

## 2014-09-02 NOTE — ED Provider Notes (Signed)
CSN: [id number]     Arrival date & time 09/02/14  2033 History  This chart was scribed for Ezequiel Essex, MD by Chester Holstein, ED Scribe. This patient was seen in room APA14/APA14 and the patient's care was started at 9:04 PM.    Chief Complaint  Patient presents with  . Fall     Patient is a 74 y.o. female presenting with fall. The history is provided by the patient. No language interpreter was used.  Fall   HPI Comments: [name] is a 74 y.o. female with PMHx of COPD, lumbar spondylosis, HTN, hypothyroidism, HLD, CVA, apraxia, and aphasia who presents to the Emergency Department complaining of fall just PTA. Pt states she was in her yard, tripped on a hole, and fell face forward, reporting that her head bounced on the ground. Pt states she felt dizzy at onset. Pt denies dizziness currently. Pt notes associated headache and left hand pain. Noted abrasions, swelling, and bruising to left wrist, bruising and swelling to right knee, abrasion and bruise to left side of chin. Pt with h/o of fracture in left hand. Pt is not on any blood thinners. Pt is utd on tetanus shot. Pt denies LOC, SOB, chest pain, neck pain, back pain different than baseline, and abdominal pain.  Past Medical History  Diagnosis Date  . Anxiety   . Hypothyroidism   . Hypertension   . Lumbar spondylosis 01/23/2011  . COPD (chronic obstructive pulmonary disease)     MIld  . Hyperlipidemia   . Fibromyalgia   . Gastritis   . Osteoporosis   . Stroke   . Headache(784.0)   . Apraxia due to stroke   . Aphasia due to stroke   . GERD (gastroesophageal reflux disease)   . Tobacco abuse    Past Surgical History  Procedure Laterality Date  . Cholecystectomy    . Gastric bypass      open  . Knee surgery Left   . Colonoscopy with esophagogastroduodenoscopy (egd)  2006    Dr. Laural Golden: normal upper endoscopy with surgically alterated stomach, normal colon but redundant  . Esophagogastroduodenoscopy  04/06/2014     Dr.Rourk: abnormal esophagus   Family History  Problem Relation Age of Onset  . Heart attack Father   . Colon cancer Brother    History  Substance Use Topics  . Smoking status: Light Tobacco Smoker -- 30 years    Types: Cigarettes  . Smokeless tobacco: Not on file     Comment: Smoking Now Every Once & Awhile Per Patient  . Alcohol Use: No   OB History    No data available     Review of Systems A complete 10 system review of systems was obtained and all systems are negative except as noted in the HPI and PMH.     Allergies  Levaquin and Macrobid  Home Medications   Prior to Admission medications   Medication Sig Start Date End Date Taking? Authorizing Provider  acetaminophen (TYLENOL) 325 MG tablet Take 2 tablets (650 mg total) by mouth every 6 (six) hours as needed for mild pain (or Fever >/= 101). 04/30/14  Yes Velvet Bathe, MD  alendronate (FOSAMAX) 70 MG tablet TAKE 1 TABLET BY MOUTH WEEKLY Patient taking differently: Take 70 mg by mouth once a week. Takes on Mondays. 02/08/14  Yes Mikey Kirschner, MD  ALPRAZolam Duanne Moron) 1 MG tablet Take 1 tablet (1 mg total) by mouth 2 (two) times daily. 07/05/14  Yes Mikey Kirschner, MD  HYDROcodone-acetaminophen (NORCO/VICODIN) 5-325 MG per tablet 1 tablet up to TID prn pain Patient taking differently: Take 1 tablet by mouth 3 (three) times daily as needed for moderate pain or severe pain. 1 tablet up to TID prn pain 07/05/14  Yes Mikey Kirschner, MD  hydrOXYzine (ATARAX/VISTARIL) 25 MG tablet TAKE 1 TABLET BY MOUTH EVERY 6 HOURS AS NEEDED FOR ITCHING 01/12/14  Yes Kathyrn Drown, MD  levothyroxine (SYNTHROID, LEVOTHROID) 112 MCG tablet TAKE 1 TABLET BY MOUTH EVERY DAY Patient taking differently: Take 112 mcg by mouth daily before breakfast.  02/08/14  Yes Mikey Kirschner, MD  Misc Natural Products (OSTEO BI-FLEX ADV DOUBLE ST PO) Take 1 tablet by mouth daily.   Yes Historical Provider, MD  Multiple Vitamin (MULTIVITAMIN WITH MINERALS)  TABS Take 1 tablet by mouth daily.   Yes Historical Provider, MD  nortriptyline (PAMELOR) 50 MG capsule TAKE ONE CAPSULE AT BEDTIME 08/11/14  Yes Mikey Kirschner, MD  pantoprazole (PROTONIX) 40 MG tablet TAKE 1 TABLET BY MOUTH EVERY DAY **STOP RANITIDINE 08/30/14  Yes Mikey Kirschner, MD  PARoxetine (PAXIL) 40 MG tablet TAKE 1 TABLET BY MOUTH DAILY AS DIRECTED 08/09/14  Yes Mikey Kirschner, MD  pravastatin (PRAVACHOL) 20 MG tablet TAKE 1 TABLET EVERY DAY 08/30/14  Yes Mikey Kirschner, MD  feeding supplement (BOOST HIGH PROTEIN) LIQD Take 237 mLs by mouth 3 (three) times daily between meals. 04/30/14   Velvet Bathe, MD  ofloxacin (FLOXIN) 0.3 % otic solution 5 drops BID for 7 days to affected ear Patient taking differently: Place 5 drops into both ears 2 (two) times daily as needed. 7 days to affected ear 07/27/14   Mikey Kirschner, MD   BP 153/55 mmHg  Pulse 77  Temp(Src) 98.6 F (37 C) (Oral)  Resp 18  Ht 5\' 2"  (1.575 m)  Wt 174 lb (78.926 kg)  BMI 31.82 kg/m2  SpO2 98% Physical Exam  Constitutional: She is oriented to person, place, and time. She appears well-developed and well-nourished. No distress.  HENT:  Head: Normocephalic and atraumatic.  Mouth/Throat: Oropharynx is clear and moist. No oropharyngeal exudate.  No malocclusion ecchymosis to underside of chin  Eyes: Conjunctivae and EOM are normal. Pupils are equal, round, and reactive to light.  Neck: Normal range of motion. Neck supple.  No meningismus.  Cardiovascular: Normal rate, regular rhythm, normal heart sounds and intact distal pulses.   No murmur heard. Intact radial pulse  Pulmonary/Chest: Effort normal and breath sounds normal. No respiratory distress.  Abdominal: Soft. There is no tenderness. There is no rebound and no guarding.  Musculoskeletal: Normal range of motion. She exhibits no edema.       Right knee: She exhibits ecchymosis (right medial knee).       Cervical back: She exhibits no bony tenderness (No  C-spine tenderness).       Lumbar back: She exhibits tenderness (Paraspinal lumbar tenderness).  Skin tear and ecchymosis to left dorsal wrist on ulnar side Hematoma and ecchymosis to right MCP Full ROM elbow, wrist and fingers  Neurological: She is alert and oriented to person, place, and time. No cranial nerve deficit. She exhibits normal muscle tone. Coordination normal.  No ataxia on finger to nose bilaterally. No pronator drift. 5/5 strength throughout. CN 2-12 intact. Negative Romberg. Equal grip strength. Sensation intact. Gait is normal.   Skin: Skin is warm.  Psychiatric: She has a normal mood and affect. Her behavior is normal.  Nursing note and vitals reviewed.  ED Course  Procedures (including critical care time) DIAGNOSTIC STUDIES: Oxygen Saturation is 98% on room air, normal by my interpretation.    COORDINATION OF CARE: 9:12 PM Discussed treatment plan with patient at beside, the patient agrees with the plan and has no further questions at this time.   Labs Review Labs Reviewed - No data to display  Imaging Review Dg Lumbar Spine Complete  09/02/2014   CLINICAL DATA:  Low back pain. Walking through yd and tripped, fall face forward.  EXAM: LUMBAR SPINE - COMPLETE 4+ VIEW  COMPARISON:  CT of the abdomen and pelvis 04/27/2014  FINDINGS: Diffuse degenerative disc disease, most pronounced at L4-5, L5-S1, and L1-2. Mild leftward scoliosis. No fracture or subluxation. SI joints are symmetric and unremarkable. Vascular calcifications without evidence of aneurysm.  IMPRESSION: Scoliosis, degenerative disc disease.  No acute bony abnormality.   Electronically Signed   By: Rolm Baptise M.D.   On: 09/02/2014 22:13   Dg Wrist Complete Left  09/02/2014   CLINICAL DATA:  Fall, landed on left arm. Left wrist pain, bruising, abrasion to ulnar side wrist.  EXAM: LEFT WRIST - COMPLETE 3+ VIEW  COMPARISON:  04/29/2008  FINDINGS: Old healed distal left radial fracture. No acute fracture.  Degenerative changes in the carpal region and first carpometacarpal joint. No subluxation or dislocation. Soft tissues are intact.  IMPRESSION: Old healed distal left radial fracture.  No acute bony abnormality.   Electronically Signed   By: Rolm Baptise M.D.   On: 09/02/2014 22:07   Ct Head Wo Contrast  09/02/2014   CLINICAL DATA:  Fall, head trauma.  EXAM: CT HEAD WITHOUT CONTRAST  CT MAXILLOFACIAL WITHOUT CONTRAST  TECHNIQUE: Multidetector CT imaging of the head and maxillofacial structures were performed using the standard protocol without intravenous contrast. Multiplanar CT image reconstructions of the maxillofacial structures were also generated.  COMPARISON:  01/20/2011 head CT, 12/20/2011 brain MRI  FINDINGS: CT HEAD FINDINGS  Subcortical and periventricular white matter hypodensities, a nonspecific finding often seen in the setting of chronic microangiopathic change. Left parietal lobe encephalomalacia, in keeping with sequelae of prior infarction. Punctate hypodensities along the basal ganglia likely reflect lacunar infarctions. No definite CT evidence of acute infarction. No intraparenchymal hemorrhage, mass, mass effect, or abnormal extra-axial fluid collection. Atherosclerotic vascular calcifications. And mastoid air cells are clear. No displaced calvarial fracture.  CT MAXILLOFACIAL FINDINGS  Globes are symmetric. Lenses are located. No retrobulbar hematoma. Orbital walls are intact. Paranasal sinuses are clear. Sinus walls are intact. Intact nasal bones and nasal septum. Intact pterygoid plates and zygomatic arches. Located temporomandibular joints. No mandible fracture. Absent mandibular molar and maxillary dentition. Atherosclerotic vascular calcifications. Upper cervical spine shows nothing acute.  IMPRESSION: White matter changes and sequelae of prior left parietal lobe and bilateral lacunar infarctions. No CT evidence of an acute intracranial abnormality.  No acute maxillofacial fracture.    Electronically Signed   By: Carlos Levering M.D.   On: 09/02/2014 22:34   Dg Knee Complete 4 Views Right  09/02/2014   CLINICAL DATA:  Generalized knee pain and swelling. Walking through yard and tripped and hole falling forward, now with worsening right knee pain. Initial encounter.  EXAM: RIGHT KNEE - COMPLETE 4+ VIEW  COMPARISON:  07/13/2014  FINDINGS: The lateral radiograph is degraded due to obliquity. No fracture or dislocation. Moderate to severe tricompartmental degenerative change of the knee, likely worse within the lateral compartment with joint space loss, subchondral sclerosis and osteophytosis. There is a minimal amount  of chondrocalcinosis within the lateral joint space. There is minimal spurring of the tibial spines. No definite joint effusion. Vascular calcifications. Regional soft tissues appear otherwise normal.  IMPRESSION: 1. No acute findings. 2. Moderate severe tricompartmental degenerative change of the knee, worse within the lateral compartment. 3. Chondrocalcinosis suggestive of CPPD.   Electronically Signed   By: Sandi Mariscal M.D.   On: 09/02/2014 22:18   Dg Hand Complete Left  09/02/2014   CLINICAL DATA:  Tripped, fell landing on left arm. Abrasion and bruising.  EXAM: LEFT HAND - COMPLETE 3+ VIEW  COMPARISON:  04/29/2008  FINDINGS: Old healed distal left radial fracture. Degenerative changes in the carpal joints and first carpometacarpal joint. No acute fracture, subluxation or dislocation. Soft tissues are intact  IMPRESSION: No acute bony abnormality.   Electronically Signed   By: Rolm Baptise M.D.   On: 09/02/2014 22:08   Ct Maxillofacial Wo Cm  09/02/2014   CLINICAL DATA:  Fall, head trauma.  EXAM: CT HEAD WITHOUT CONTRAST  CT MAXILLOFACIAL WITHOUT CONTRAST  TECHNIQUE: Multidetector CT imaging of the head and maxillofacial structures were performed using the standard protocol without intravenous contrast. Multiplanar CT image reconstructions of the maxillofacial structures  were also generated.  COMPARISON:  01/20/2011 head CT, 12/20/2011 brain MRI  FINDINGS: CT HEAD FINDINGS  Subcortical and periventricular white matter hypodensities, a nonspecific finding often seen in the setting of chronic microangiopathic change. Left parietal lobe encephalomalacia, in keeping with sequelae of prior infarction. Punctate hypodensities along the basal ganglia likely reflect lacunar infarctions. No definite CT evidence of acute infarction. No intraparenchymal hemorrhage, mass, mass effect, or abnormal extra-axial fluid collection. Atherosclerotic vascular calcifications. And mastoid air cells are clear. No displaced calvarial fracture.  CT MAXILLOFACIAL FINDINGS  Globes are symmetric. Lenses are located. No retrobulbar hematoma. Orbital walls are intact. Paranasal sinuses are clear. Sinus walls are intact. Intact nasal bones and nasal septum. Intact pterygoid plates and zygomatic arches. Located temporomandibular joints. No mandible fracture. Absent mandibular molar and maxillary dentition. Atherosclerotic vascular calcifications. Upper cervical spine shows nothing acute.  IMPRESSION: White matter changes and sequelae of prior left parietal lobe and bilateral lacunar infarctions. No CT evidence of an acute intracranial abnormality.  No acute maxillofacial fracture.   Electronically Signed   By: Carlos Levering M.D.   On: 09/02/2014 22:34     EKG Interpretation   Date/Time:  Thursday September 02 2014 22:26:29 EDT Ventricular Rate:  60 PR Interval:  172 QRS Duration: 139 QT Interval:  473 QTC Calculation: 473 R Axis:   -44 Text Interpretation:  Sinus rhythm RBBB and LAFB No significant change was  found Confirmed by Wyvonnia Dusky  MD, Made[facility] Seals [phone]) on 09/02/2014 10:31:15 PM     No orders of the defined types were placed in this encounter.    MDM   Final diagnoses:  Fall  Multiple contusions   Trip and fall in yard, striking chin and L wrist.  Denies LOC.  No CP, SOB, neck, back or  abdominal pain. EKG unchanged.  CT head negative. No facial fractures. Xrays negative of wrist and knee.  Pain controlled and able to ambulate. Denies dizziness or lightheadedness.  Followup with PCP. Return precautions discussed.   I personally performed the services described in this documentation, which was scribed in my presence. The recorded information has been reviewed and is accurate.    Ezequiel Essex, MD 09/03/14 [phone]

## 2014-09-02 NOTE — Discharge Instructions (Signed)
Contusion Your Xrays are negative for fractures. Follow up with your doctor. Return to the ED if you develop new or worsening symptoms. A contusion is a deep bruise. Contusions are the result of an injury that caused bleeding under the skin. The contusion may turn blue, purple, or yellow. Minor injuries will give you a painless contusion, but more severe contusions may stay painful and swollen for a few weeks.  CAUSES  A contusion is usually caused by a blow, trauma, or direct force to an area of the body. SYMPTOMS   Swelling and redness of the injured area.  Bruising of the injured area.  Tenderness and soreness of the injured area.  Pain. DIAGNOSIS  The diagnosis can be made by taking a history and physical exam. An X-ray, CT scan, or MRI may be needed to determine if there were any associated injuries, such as fractures. TREATMENT  Specific treatment will depend on what area of the body was injured. In general, the best treatment for a contusion is resting, icing, elevating, and applying cold compresses to the injured area. Over-the-counter medicines may also be recommended for pain control. Ask your caregiver what the best treatment is for your contusion. HOME CARE INSTRUCTIONS   Put ice on the injured area.  Put ice in a plastic bag.  Place a towel between your skin and the bag.  Leave the ice on for 15-20 minutes, 3-4 times a day, or as directed by your health care provider.  Only take over-the-counter or prescription medicines for pain, discomfort, or fever as directed by your caregiver. Your caregiver may recommend avoiding anti-inflammatory medicines (aspirin, ibuprofen, and naproxen) for 48 hours because these medicines may increase bruising.  Rest the injured area.  If possible, elevate the injured area to reduce swelling. SEEK IMMEDIATE MEDICAL CARE IF:   You have increased bruising or swelling.  You have pain that is getting worse.  Your swelling or pain is not  relieved with medicines. MAKE SURE YOU:   Understand these instructions.  Will watch your condition.  Will get help right away if you are not doing well or get worse. Document Released: 02/07/2005 Document Revised: 05/05/2013 Document Reviewed: 03/05/2011 [facility] Patient Information 2015 [place], [place]. This information is not intended to replace advice given to you by your health care provider. Make sure you discuss any questions you have with your health care provider.

## 2014-09-29 ENCOUNTER — Encounter: Payer: Self-pay | Admitting: Family Medicine

## 2014-09-29 ENCOUNTER — Ambulatory Visit (INDEPENDENT_AMBULATORY_CARE_PROVIDER_SITE_OTHER): Payer: Medicare Other | Admitting: Family Medicine

## 2014-09-29 VITALS — BP 142/80 | Ht 62.0 in | Wt 178.0 lb

## 2014-09-29 DIAGNOSIS — Z79899 Other long term (current) drug therapy: Secondary | ICD-10-CM

## 2014-09-29 MED ORDER — HYDROCODONE-ACETAMINOPHEN 5-325 MG PO TABS: ORAL_TABLET | ORAL | Status: DC

## 2014-09-29 NOTE — Patient Instructions (Signed)

## 2014-09-29 NOTE — Progress Notes (Signed)
   Subjective:    Patient ID: [name], female    DOB: 02/12/1941, 74 y.o.   MRN: [id number]  HPI This patient was seen today for chronic pain  The medication list was reviewed and updated.   -Compliance with pain medication: sometimes having to take one and a half tablet instead on one tablet. Would like for it to be increased  The patient was advised the importance of maintaining medication and not using illegal substances with these.  Refills needed: yes  The patient was educated that we can provide 3 monthly scripts for their medication, it is their responsibility to follow the instructions.  Side effects or complications from medications: none  Patient is aware that pain medications are meant to minimize the severity of the pain to allow their pain levels to improve to allow for better function. They are aware of that pain medications cannot totally remove their pain.  Due for UDT ( at least once per year) : urine drug screen today.   Concerns about hair falling out. States her mother had hair thinning when she got older   Patient claims compliance with her cholesterol medicine. Trying to watch her diet in that regard.  Ongoing substantial pain pain primarily in the back also worse in the leg right leg.  No new symptoms of TIA or stroke.   Review of Systems No headache no chest pain no shortness breath no weight gain weight loss    Objective:   Physical Exam  Alert no apparent distress H&T normal lungs clear heart regular rhythm low back tenderness to palpation. Negative straight leg raise right knee crepitations evident some swelling Neuro exam stable. Baseline aphasia  stable     Assessment & Plan:  Impression #1 chronic pain discussed #2 status post stroke discussed stable. #3 reflux ongoing #4 normal hair thinning for age discussed plan urine drug screen done. Pain medications refilled. Drug contract filled. Follow-up as scheduled

## 2014-10-04 LAB — TOXASSURE SELECT 13 (MW), URINE: PDF: 0

## 2014-10-06 ENCOUNTER — Other Ambulatory Visit: Payer: Self-pay | Admitting: Family Medicine

## 2014-11-03 ENCOUNTER — Other Ambulatory Visit: Payer: Self-pay | Admitting: Family Medicine

## 2014-11-16 ENCOUNTER — Other Ambulatory Visit: Payer: Self-pay | Admitting: Family Medicine

## 2014-12-01 ENCOUNTER — Other Ambulatory Visit: Payer: Self-pay | Admitting: Family Medicine

## 2014-12-31 ENCOUNTER — Ambulatory Visit (INDEPENDENT_AMBULATORY_CARE_PROVIDER_SITE_OTHER): Payer: Medicare Other | Admitting: Family Medicine

## 2014-12-31 VITALS — BP 136/78 | Ht 62.0 in | Wt 188.2 lb

## 2014-12-31 DIAGNOSIS — E039 Hypothyroidism, unspecified: Secondary | ICD-10-CM | POA: Diagnosis not present

## 2014-12-31 DIAGNOSIS — I10 Essential (primary) hypertension: Secondary | ICD-10-CM

## 2014-12-31 DIAGNOSIS — E785 Hyperlipidemia, unspecified: Secondary | ICD-10-CM

## 2014-12-31 DIAGNOSIS — Z79899 Other long term (current) drug therapy: Secondary | ICD-10-CM | POA: Diagnosis not present

## 2014-12-31 MED ORDER — ALPRAZOLAM 1 MG PO TABS: 1.0000 mg | ORAL_TABLET | Freq: Two times a day (BID) | ORAL | Status: DC

## 2014-12-31 MED ORDER — HYDROCODONE-ACETAMINOPHEN 5-325 MG PO TABS: ORAL_TABLET | ORAL | Status: DC

## 2014-12-31 MED ORDER — CLARITHROMYCIN 500 MG PO TABS: 500.0000 mg | ORAL_TABLET | Freq: Three times a day (TID) | ORAL | Status: DC

## 2014-12-31 NOTE — Progress Notes (Signed)
   Subjective:    Patient ID: [name], female    DOB: 1940-06-18, 74 y.o.   MRN: [id number]  HPI This patient was seen today for chronic pain  The medication list was reviewed and updated.   -Compliance with pain medication: Yes  The patient was advised the importance of maintaining medication and not using illegal substances with these.  Refills needed: yes  The patient was educated that we can provide 3 monthly scripts for their medication, it is their responsibility to follow the instructions.  Side effects or complications from medications:   Patient is aware that pain medications are meant to minimize the severity of the pain to allow their pain levels to improve to allow for better function. They are aware of that pain medications cannot totally remove their pain.  Due for UDT ( at least once per year) :   Patient states no other concerns this visit.   Still having sig pain  Patient arrives with multiple concerns bronchial cough. Productive in nature. Several weeks' duration.  Compliant with thyroid medicine. No symptoms of high or low thyroid. Next  Compliant with blood pressure medicine. No obvious side effects watching salt intake.  Compliant with lipid medicine. No obvious side effects  Review of Systems    no headache no chest pain ongoing back pain some knee pain no blood in stool Objective:   Physical Exam  Alert vitals stable blood pressure good on repeat lungs clear heart regular rhythm low back tender to percussion ankles trace edema      Assessment & Plan:  Impression 1 hypertension good control #2 hyperlipidemia status uncertain #3 hypothyroidism status uncertain #4 chronic pain discussed #5 subacute bronchitis plan antibiotics prescribed. Pain medicine prescribed. Appropriate blood work. Recheck in several months W[facility]

## 2015-01-17 ENCOUNTER — Other Ambulatory Visit: Payer: Self-pay | Admitting: Family Medicine

## 2015-01-20 ENCOUNTER — Other Ambulatory Visit: Payer: Self-pay | Admitting: Family Medicine

## 2015-01-26 DIAGNOSIS — Z79899 Other long term (current) drug therapy: Secondary | ICD-10-CM | POA: Diagnosis not present

## 2015-01-26 DIAGNOSIS — E785 Hyperlipidemia, unspecified: Secondary | ICD-10-CM | POA: Diagnosis not present

## 2015-01-26 DIAGNOSIS — E039 Hypothyroidism, unspecified: Secondary | ICD-10-CM | POA: Diagnosis not present

## 2015-01-27 LAB — HEPATIC FUNCTION PANEL
ALK PHOS: 70 IU/L (ref 39–117)
ALT: 10 IU/L (ref 0–32)
AST: 18 IU/L (ref 0–40)
Albumin: 4 g/dL (ref 3.5–4.8)
BILIRUBIN, DIRECT: 0.08 mg/dL (ref 0.00–0.40)
Bilirubin Total: 0.3 mg/dL (ref 0.0–1.2)
TOTAL PROTEIN: 6.1 g/dL (ref 6.0–8.5)

## 2015-01-27 LAB — LIPID PANEL
Chol/HDL Ratio: 2.3 ratio units (ref 0.0–4.4)
Cholesterol, Total: 175 mg/dL (ref 100–199)
HDL: 76 mg/dL (ref >39)
LDL Calculated: 81 mg/dL (ref 0–99)
Triglycerides: 88 mg/dL (ref 0–149)
VLDL CHOLESTEROL CAL: 18 mg/dL (ref 5–40)

## 2015-01-27 LAB — BASIC METABOLIC PANEL
BUN/Creatinine Ratio: 20 (ref 11–26)
BUN: 16 mg/dL (ref 8–27)
CO2: 24 mmol/L (ref 18–29)
CREATININE: 0.8 mg/dL (ref 0.57–1.00)
Calcium: 9.5 mg/dL (ref 8.7–10.3)
Chloride: 103 mmol/L (ref 97–108)
GFR calc Af Amer: 84 mL/min/{1.73_m2} (ref >59)
GFR calc non Af Amer: 73 mL/min/{1.73_m2} (ref >59)
GLUCOSE: 92 mg/dL (ref 65–99)
Potassium: 5.4 mmol/L — ABNORMAL HIGH (ref 3.5–5.2)
SODIUM: 142 mmol/L (ref 134–144)

## 2015-01-27 LAB — TSH: TSH: 0.949 u[IU]/mL (ref 0.450–4.500)

## 2015-01-31 ENCOUNTER — Other Ambulatory Visit: Payer: Self-pay | Admitting: Family Medicine

## 2015-02-07 ENCOUNTER — Encounter: Payer: Self-pay | Admitting: Family Medicine

## 2015-02-24 ENCOUNTER — Other Ambulatory Visit: Payer: Self-pay | Admitting: Family Medicine

## 2015-02-28 ENCOUNTER — Other Ambulatory Visit: Payer: Self-pay | Admitting: Family Medicine

## 2015-03-09 DIAGNOSIS — Z23 Encounter for immunization: Secondary | ICD-10-CM | POA: Diagnosis not present

## 2015-03-14 ENCOUNTER — Encounter: Payer: Self-pay | Admitting: Nurse Practitioner

## 2015-03-14 ENCOUNTER — Ambulatory Visit: Payer: Medicare Other | Admitting: Family Medicine

## 2015-03-14 ENCOUNTER — Ambulatory Visit (INDEPENDENT_AMBULATORY_CARE_PROVIDER_SITE_OTHER): Payer: Medicare Other | Admitting: Nurse Practitioner

## 2015-03-14 VITALS — BP 148/70 | Temp 98.2°F | Ht 62.0 in | Wt 193.0 lb

## 2015-03-14 DIAGNOSIS — N3941 Urge incontinence: Secondary | ICD-10-CM

## 2015-03-14 MED ORDER — TOLTERODINE TARTRATE ER 2 MG PO CP24: 2.0000 mg | ORAL_CAPSULE | Freq: Every day | ORAL | Status: DC

## 2015-03-15 ENCOUNTER — Encounter: Payer: Self-pay | Admitting: Nurse Practitioner

## 2015-03-15 DIAGNOSIS — N3941 Urge incontinence: Secondary | ICD-10-CM | POA: Insufficient documentation

## 2015-03-15 NOTE — Progress Notes (Signed)
Subjective:  Presents for complaints of urinary leakage and incontinence. Has been a long-term problem but worse after her stroke. Has to her pad all the time. Urgency. No dysuria. No fever. Frequent leakage of urine sometimes large amounts. Patient is alone during the visit, some difficulty with communication related to her stroke.  Objective:   BP 148/70 mmHg  Temp(Src) 98.2 F (36.8 C) (Oral)  Ht 5\' 2"  (1.575 m)  Wt 193 lb (87.544 kg)  BMI 35.29 kg/m2 NAD. Alert, oriented. Lungs clear. Heart regular rate rhythm. Abdomen soft nondistended nontender. No CVA tenderness.  Assessment:  Problem List Items Addressed This Visit      Other   Urge incontinence of urine - Primary   Relevant Medications   tolterodine (DETROL LA) 2 MG 24 hr capsule     Plan:  Meds ordered this encounter  Medications  . tolterodine (DETROL LA) 2 MG 24 hr capsule    Sig: Take 1 capsule (2 mg total) by mouth daily. For bladder    Dispense:  30 capsule    Refill:  2    Order Specific Question:  Supervising Provider    Answer:  Mikey Kirschner [[id number]]   Reviewed several factors that can be adding to her incontinence issues including age, history of childbirth and stroke. Discussed options. Patient defers urologic referral at this time. Start low dose Detrol LA generic as directed. Cautioned about potential adverse effects including increased dry mouth which is are ready a problem for patient. She wishes to try medication regardless. DC med and call if any problems. Also call back if she wishes urologic referral. Otherwise routine follow-up.

## 2015-03-20 ENCOUNTER — Emergency Department (HOSPITAL_COMMUNITY)
Admission: EM | Admit: 2015-03-20 | Discharge: 2015-03-20 | Disposition: A | Discharge status: Home / Self Care | Payer: Medicare Other | Attending: Emergency Medicine | Admitting: Emergency Medicine

## 2015-03-20 ENCOUNTER — Encounter (HOSPITAL_COMMUNITY): Payer: Self-pay | Admitting: *Deleted

## 2015-03-20 ENCOUNTER — Emergency Department (HOSPITAL_COMMUNITY): Payer: Medicare Other

## 2015-03-20 DIAGNOSIS — R410 Disorientation, unspecified: Secondary | ICD-10-CM | POA: Insufficient documentation

## 2015-03-20 DIAGNOSIS — Z8673 Personal history of transient ischemic attack (TIA), and cerebral infarction without residual deficits: Secondary | ICD-10-CM | POA: Diagnosis not present

## 2015-03-20 DIAGNOSIS — I10 Essential (primary) hypertension: Secondary | ICD-10-CM | POA: Insufficient documentation

## 2015-03-20 DIAGNOSIS — F419 Anxiety disorder, unspecified: Secondary | ICD-10-CM | POA: Insufficient documentation

## 2015-03-20 DIAGNOSIS — R4789 Other speech disturbances: Secondary | ICD-10-CM | POA: Diagnosis not present

## 2015-03-20 DIAGNOSIS — M797 Fibromyalgia: Secondary | ICD-10-CM | POA: Insufficient documentation

## 2015-03-20 DIAGNOSIS — Z79899 Other long term (current) drug therapy: Secondary | ICD-10-CM | POA: Insufficient documentation

## 2015-03-20 DIAGNOSIS — J449 Chronic obstructive pulmonary disease, unspecified: Secondary | ICD-10-CM | POA: Insufficient documentation

## 2015-03-20 DIAGNOSIS — D649 Anemia, unspecified: Secondary | ICD-10-CM | POA: Insufficient documentation

## 2015-03-20 DIAGNOSIS — D6489 Other specified anemias: Secondary | ICD-10-CM

## 2015-03-20 DIAGNOSIS — N390 Urinary tract infection, site not specified: Secondary | ICD-10-CM | POA: Diagnosis not present

## 2015-03-20 DIAGNOSIS — R51 Headache: Secondary | ICD-10-CM | POA: Diagnosis not present

## 2015-03-20 DIAGNOSIS — H538 Other visual disturbances: Secondary | ICD-10-CM | POA: Insufficient documentation

## 2015-03-20 DIAGNOSIS — K219 Gastro-esophageal reflux disease without esophagitis: Secondary | ICD-10-CM | POA: Insufficient documentation

## 2015-03-20 DIAGNOSIS — E785 Hyperlipidemia, unspecified: Secondary | ICD-10-CM | POA: Diagnosis not present

## 2015-03-20 DIAGNOSIS — Z720 Tobacco use: Secondary | ICD-10-CM | POA: Diagnosis not present

## 2015-03-20 DIAGNOSIS — R531 Weakness: Secondary | ICD-10-CM | POA: Diagnosis not present

## 2015-03-20 DIAGNOSIS — E039 Hypothyroidism, unspecified: Secondary | ICD-10-CM | POA: Insufficient documentation

## 2015-03-20 LAB — BASIC METABOLIC PANEL
Anion gap: 6 (ref 5–15)
BUN: 18 mg/dL (ref 6–20)
CO2: 23 mmol/L (ref 22–32)
CREATININE: 0.75 mg/dL (ref 0.44–1.00)
Calcium: 8.8 mg/dL — ABNORMAL LOW (ref 8.9–10.3)
Chloride: 108 mmol/L (ref 101–111)
GFR calc Af Amer: >60 mL/min (ref >60)
Glucose, Bld: 110 mg/dL — ABNORMAL HIGH (ref 65–99)
Potassium: 4.2 mmol/L (ref 3.5–5.1)
SODIUM: 137 mmol/L (ref 135–145)

## 2015-03-20 LAB — CBC WITH DIFFERENTIAL/PLATELET
BASOS PCT: 1 %
Basophils Absolute: 0.1 10*3/uL (ref 0.0–0.1)
EOS ABS: 0.2 10*3/uL (ref 0.0–0.7)
Eosinophils Relative: 3 %
HCT: 29.3 % — ABNORMAL LOW (ref 36.0–46.0)
HEMOGLOBIN: 9.3 g/dL — AB (ref 12.0–15.0)
Lymphocytes Relative: 39 %
Lymphs Abs: 2.6 10*3/uL (ref 0.7–4.0)
MCH: 25.1 pg — AB (ref 26.0–34.0)
MCHC: 31.7 g/dL (ref 30.0–36.0)
MCV: 79 fL (ref 78.0–100.0)
Monocytes Absolute: 0.5 10*3/uL (ref 0.1–1.0)
Monocytes Relative: 8 %
NEUTROS PCT: 49 %
Neutro Abs: 3.2 10*3/uL (ref 1.7–7.7)
PLATELETS: 281 10*3/uL (ref 150–400)
RBC: 3.71 MIL/uL — ABNORMAL LOW (ref 3.87–5.11)
RDW: 17.3 % — AB (ref 11.5–15.5)
WBC: 6.5 10*3/uL (ref 4.0–10.5)

## 2015-03-20 LAB — URINALYSIS, ROUTINE W REFLEX MICROSCOPIC
Bilirubin Urine: NEGATIVE
GLUCOSE, UA: NEGATIVE mg/dL
Ketones, ur: NEGATIVE mg/dL
Nitrite: NEGATIVE
PH: 5.5 (ref 5.0–8.0)
Protein, ur: NEGATIVE mg/dL
Specific Gravity, Urine: 1.015 (ref 1.005–1.030)
Urobilinogen, UA: 0.2 mg/dL (ref 0.0–1.0)

## 2015-03-20 LAB — URINE MICROSCOPIC-ADD ON

## 2015-03-20 LAB — ETHANOL: Alcohol, Ethyl (B): <5 mg/dL (ref <5)

## 2015-03-20 MED ORDER — ACETAMINOPHEN 500 MG PO TABS
1000.0000 mg | ORAL_TABLET | Freq: Once | ORAL | Status: AC
  Administered 2015-03-20: 1000 mg via ORAL
  Filled 2015-03-20: qty 2

## 2015-03-20 MED ORDER — SODIUM CHLORIDE 0.9 % IV BOLUS (SEPSIS)
500.0000 mL | Freq: Once | INTRAVENOUS | Status: AC
  Administered 2015-03-20: 500 mL via INTRAVENOUS

## 2015-03-20 MED ORDER — CEFTRIAXONE SODIUM 1 G IJ SOLR
1.0000 g | Freq: Once | INTRAMUSCULAR | Status: AC
  Administered 2015-03-20: 1 g via INTRAVENOUS
  Filled 2015-03-20: qty 10

## 2015-03-20 MED ORDER — CEPHALEXIN 500 MG PO CAPS: 500.0000 mg | ORAL_CAPSULE | Freq: Two times a day (BID) | ORAL | Status: DC

## 2015-03-20 NOTE — Discharge Instructions (Signed)
If you were given medicines take as directed.  If you are on coumadin or contraceptives realize their levels and effectiveness is altered by many different medicines.  If you have any reaction (rash, tongues swelling, other) to the medicines stop taking and see a physician.    If your blood pressure was elevated in the ER make sure you follow up for management with a primary doctor or return for chest pain, shortness of breath or stroke symptoms.  Please follow up as directed and return to the ER or see a physician for new or worsening symptoms.  Thank you. Filed Vitals:   03/20/15 1530 03/20/15 1600 03/20/15 1606 03/20/15 1630  BP: 152/100 169/69 169/69 179/75  Pulse:   63   Temp:      TempSrc:      Resp: 20 15 20 25   SpO2:   97%

## 2015-03-20 NOTE — ED Notes (Signed)
MD at bedside. 

## 2015-03-20 NOTE — ED Provider Notes (Signed)
CSN: [id number]     Arrival date & time 03/20/15  1259 History   First MD Initiated Contact with Patient 03/20/15 1312     Chief Complaint  Patient presents with  . Cerebrovascular Accident     (Consider location/radiation/quality/duration/timing/severity/associated sxs/prior Treatment) HPI Comments: 74 year old female with history of fibromyalgia, high blood pressure, stroke with mild speech and mild unilateral weakness deficits, anemia presents with mild stroke symptoms since last night. Patient had worsening of her speech and mild confusion per the son who try to convince her to come last night but she refused. It persisted mild this morning. Intermittent at times. Unsure which side she is normally weaker on. Symptoms deathly*last night. Patient was seen for a urinary issue recently unsure if she is on antibiotics. Difficulty obtaining history from patient as she does not know her medical history well. Chronic difficulty hearing.  Patient is a 74 y.o. female presenting with Acute Neurological Problem. The history is provided by the patient, a relative and medical records.  Cerebrovascular Accident Pertinent negatives include no chest pain, no abdominal pain, no headaches and no shortness of breath.    Past Medical History  Diagnosis Date  . Anxiety   . Hypothyroidism   . Hypertension   . Lumbar spondylosis 01/23/2011  . COPD (chronic obstructive pulmonary disease) (HCC)     [facility]  . Hyperlipidemia   . Fibromyalgia   . Gastritis   . Osteoporosis   . Stroke ([place])   . Headache(784.0)   . Apraxia due to stroke   . Aphasia due to stroke   . GERD (gastroesophageal reflux disease)   . Tobacco abuse    Past Surgical History  Procedure Laterality Date  . Cholecystectomy    . Gastric bypass      open  . Knee surgery Left   . Colonoscopy with esophagogastroduodenoscopy (egd)  2006    Dr. Laural Golden: normal upper endoscopy with surgically alterated stomach, normal colon but redundant  .  Esophagogastroduodenoscopy  04/06/2014    Dr.Rourk: abnormal esophagus   Family History  Problem Relation Age of Onset  . Heart attack Father   . Colon cancer Brother    Social History  Substance Use Topics  . Smoking status: Light Tobacco Smoker -- 30 years    Types: Cigarettes  . Smokeless tobacco: None     Comment: Smoking Now Every Once & Awhile Per Patient  . Alcohol Use: No   OB History    No data available     Review of Systems  Constitutional: Negative for fever and chills.  HENT: Negative for congestion.   Eyes: Positive for visual disturbance (chronic right eye).  Respiratory: Negative for shortness of breath.   Cardiovascular: Negative for chest pain.  Gastrointestinal: Negative for vomiting and abdominal pain.  Genitourinary: Negative for dysuria and flank pain.  Musculoskeletal: Negative for back pain, neck pain and neck stiffness.  Skin: Negative for rash.  Neurological: Positive for speech difficulty and weakness (general mild). Negative for facial asymmetry, light-headedness and headaches.  Psychiatric/Behavioral: Positive for confusion.      Allergies  Levaquin and Macrobid  Home Medications   Prior to Admission medications   Medication Sig Start Date End Date Taking? Authorizing Provider  acetaminophen (TYLENOL) 325 MG tablet Take 2 tablets (650 mg total) by mouth every 6 (six) hours as needed for mild pain (or Fever >/= 101). 04/30/14  Yes Velvet Bathe, MD  ALPRAZolam Duanne Moron) 1 MG tablet Take 1 tablet (1 mg total) by mouth  2 (two) times daily. 12/31/14  Yes Mikey Kirschner, MD  feeding supplement (BOOST HIGH PROTEIN) LIQD Take 237 mLs by mouth 3 (three) times daily between meals. 04/30/14  Yes Velvet Bathe, MD  HYDROcodone-acetaminophen (NORCO/VICODIN) 5-325 MG per tablet 1 tablet up to TID prn pain Patient taking differently: Take 1 tablet by mouth every 8 (eight) hours as needed for moderate pain.  12/31/14  Yes Mikey Kirschner, MD  levothyroxine  (SYNTHROID, LEVOTHROID) 112 MCG tablet TAKE 1 TABLET BY MOUTH DAILY AS DIRECTED 12/01/14  Yes Mikey Kirschner, MD  lisinopril (PRINIVIL,ZESTRIL) 10 MG tablet Take 15 mg by mouth daily.  01/09/15  Yes Historical Provider, MD  Misc Natural Products (OSTEO BI-FLEX ADV DOUBLE ST PO) Take 1 tablet by mouth daily.   Yes Historical Provider, MD  Multiple Vitamin (MULTIVITAMIN WITH MINERALS) TABS Take 1 tablet by mouth daily.   Yes Historical Provider, MD  nortriptyline (PAMELOR) 50 MG capsule TAKE ONE CAPSULE AT BEDTIME 01/31/15  Yes Mikey Kirschner, MD  pantoprazole (PROTONIX) 40 MG tablet TAKE 1 TABLET BY MOUTH EVERY DAY **STOP RANITIDINE 12/01/14  Yes Mikey Kirschner, MD  PARoxetine (PAXIL) 40 MG tablet TAKE 1 TABLET BY MOUTH DAILY AS DIRECTED 01/21/15  Yes Mikey Kirschner, MD  pravastatin (PRAVACHOL) 20 MG tablet TAKE 1 TABLET BY MOUTH EVERY DAY 12/01/14  Yes Mikey Kirschner, MD  alendronate (FOSAMAX) 70 MG tablet TAKE 1 TABLET BY MOUTH ONCE A WEEK AS DIRECTED Patient taking differently: TAKE 1 TABLET BY MOUTH ONCE A WEEK AS DIRECTED ON TUESDAYS 02/28/15   Mikey Kirschner, MD  cephALEXin (KEFLEX) 500 MG capsule Take 1 capsule (500 mg total) by mouth 2 (two) times daily. 03/21/15   Elnora Morrison, MD  clarithromycin (BIAXIN) 500 MG tablet Take 1 tablet (500 mg total) by mouth 3 (three) times daily. Patient not taking: Reported on 03/14/2015 12/31/14   Mikey Kirschner, MD  hydrOXYzine (ATARAX/VISTARIL) 25 MG tablet TAKE 1 TABLET BY MOUTH EVERY 6 HOURS AS NEEDED FOR ITCHING 02/24/15   Mikey Kirschner, MD  ofloxacin (OCUFLOX) 0.3 % ophthalmic solution INSTILL 5 DROPS IN AFFECTED EAR(S) TWICE DAILY FOR SEVEN DAYS Patient not taking: Reported on 12/31/2014 11/16/14   Mikey Kirschner, MD  tolterodine (DETROL LA) 2 MG 24 hr capsule Take 1 capsule (2 mg total) by mouth daily. For bladder 03/14/15   Nilda Simmer, NP   BP 179/75 mmHg  Pulse 63  Temp(Src) 98.4 F (36.9 C) (Oral)  Resp 25  SpO2 97% Physical  Exam  Constitutional: She is oriented to person, place, and time. She appears well-developed and well-nourished.  HENT:  Head: Normocephalic and atraumatic.  Eyes: Right eye exhibits no discharge. Left eye exhibits no discharge.  Neck: Normal range of motion. Neck supple. No tracheal deviation present.  Cardiovascular: Normal rate and regular rhythm.   Pulmonary/Chest: Effort normal and breath sounds normal.  Abdominal: Soft. She exhibits no distension. There is no tenderness. There is no guarding.  Musculoskeletal: She exhibits no edema.  Neurological: She is alert and oriented to person, place, and time. No cranial nerve deficit. GCS eye subscore is 4. GCS verbal subscore is 5. GCS motor subscore is 6.  Patient has mild blurry vision right eye versus left which is chronic. No facial droop, extraocular muscle function intact, neck supple. Patient has equal strength in all extremities with no arm or leg drift. Sensation intact grossly to palpation bilateral. Finger nose intact.  Skin: Skin is  warm. No rash noted.  Psychiatric: She has a normal mood and affect.  Nursing note and vitals reviewed.   ED Course  Procedures (including critical care time) Labs Review Labs Reviewed  CBC WITH DIFFERENTIAL/PLATELET - Abnormal; Notable for the following:    RBC 3.71 (*)    Hemoglobin 9.3 (*)    HCT 29.3 (*)    MCH 25.1 (*)    RDW 17.3 (*)    All other components within normal limits  BASIC METABOLIC PANEL - Abnormal; Notable for the following:    Glucose, Bld 110 (*)    Calcium 8.8 (*)    All other components within normal limits  URINALYSIS, ROUTINE W REFLEX MICROSCOPIC (NOT AT [facility]) - Abnormal; Notable for the following:    APPearance CLOUDY (*)    Hgb urine dipstick TRACE (*)    Leukocytes, UA MODERATE (*)    All other components within normal limits  URINE CULTURE  ETHANOL  URINE MICROSCOPIC-ADD ON    Imaging Review Ct Head Wo Contrast  03/20/2015  CLINICAL DATA:  Headache since  yesterday morning. Previous stroke 2 years ago. EXAM: CT HEAD WITHOUT CONTRAST TECHNIQUE: Contiguous axial images were obtained from the base of the skull through the vertex without intravenous contrast. COMPARISON:  09/02/2014. FINDINGS: Diffusely enlarged ventricles and subarachnoid spaces. Patchy white matter low density in both cerebral hemispheres. Stable old left anterior parietal lobe infarct. No intracranial hemorrhage, mass lesion or CT evidence of acute infarction. Unremarkable bones and included paranasal sinuses. IMPRESSION: 1. No acute abnormality. 2. Stable mild diffuse cerebral and cerebellar atrophy. 3. Stable mild to moderate chronic small vessel white matter ischemic changes in both cerebral hemispheres. 4. Stable old left parietal lobe infarct. Electronically Signed   By: Claudie Revering M.D.   On: 03/20/2015 15:12   I have personally reviewed and evaluated these images and lab results as part of my medical decision-making.   EKG Interpretation   Date/Time:  Sunday March 20 2015 13:14:54 EST Ventricular Rate:  77 PR Interval:  171 QRS Duration: 147 QT Interval:  410 QTC Calculation: 464 R Axis:   -58 Text Interpretation:  Sinus rhythm RBBB and LAFB Probable left ventricular  hypertrophy Confirmed by Sadiq Mccauley  MD, Keoshia Steinmetz ([id number]) on 03/20/2015 1:51:59 PM      MDM   Final diagnoses:  Confusion  UTI (lower urinary tract infection)  Anemia due to other cause   Patient presents with mild intermittent confusion and speech changes. Patient does have a history of aphasia and this is mild worse than normal. Plan for CT head, observation, blood work, urinalysis. No focal deficits on exam. Patient out of any window for TPA.  CT scan showed old stroke no acute findings. Patient had baseline the ER. Blood pressure elevated. Urinalysis reviewed consistent with infection which would explain intermittent mild confusion. Patient has family in the room, plan for close outpatient follow-up to  try to keep the patient out of the hospital. Rocephin given prior to discharge. Plan for Keflex.  Results and differential diagnosis were discussed with the patient/parent/guardian. Xrays were independently reviewed by myself.  Close follow up outpatient was discussed, comfortable with the plan.   Medications  acetaminophen (TYLENOL) tablet 1,000 mg (1,000 mg Oral Given 03/20/15 1338)  sodium chloride 0.9 % bolus 500 mL (0 mLs Intravenous Stopped 03/20/15 1436)  cefTRIAXone (ROCEPHIN) 1 g in dextrose 5 % 50 mL IVPB (0 g Intravenous Stopped 03/20/15 1627)    Filed Vitals:   03/20/15 1530 03/20/15  1600 03/20/15 1606 03/20/15 1630  BP: 152/100 169/69 169/69 179/75  Pulse:   63   Temp:      TempSrc:      Resp: 20 15 20 25   SpO2:   97%     Final diagnoses:  Confusion  UTI (lower urinary tract infection)  Anemia due to other cause       Elnora Morrison, MD 03/20/15 1658

## 2015-03-20 NOTE — ED Notes (Signed)
Pt with headache since yesterday morning, pt states hx of cva about 2 years ago

## 2015-03-21 LAB — URINE CULTURE

## 2015-03-23 ENCOUNTER — Encounter: Payer: Self-pay | Admitting: Family Medicine

## 2015-03-23 ENCOUNTER — Ambulatory Visit (INDEPENDENT_AMBULATORY_CARE_PROVIDER_SITE_OTHER): Payer: Medicare Other | Admitting: Family Medicine

## 2015-03-23 VITALS — BP 138/84 | Temp 99.1°F | Ht 62.0 in | Wt 187.0 lb

## 2015-03-23 DIAGNOSIS — I69320 Aphasia following cerebral infarction: Secondary | ICD-10-CM | POA: Diagnosis not present

## 2015-03-23 DIAGNOSIS — M25511 Pain in right shoulder: Secondary | ICD-10-CM

## 2015-03-23 DIAGNOSIS — I10 Essential (primary) hypertension: Secondary | ICD-10-CM | POA: Diagnosis not present

## 2015-03-23 MED ORDER — HYDROCODONE-ACETAMINOPHEN 5-325 MG PO TABS: ORAL_TABLET | ORAL | Status: DC

## 2015-03-23 MED ORDER — ALPRAZOLAM 1 MG PO TABS: ORAL_TABLET | ORAL | Status: DC

## 2015-03-23 NOTE — Progress Notes (Signed)
   Subjective:    Patient ID: [name], female    DOB: 07-05-1940, 74 y.o.   MRN: [id number] Patient arrives office for follow-up of several distinct concerns. She was to a chronic visit next week. She presents early today with acute concerns. We also covered her chronic concerns at the same time. Next  Patient notes ongoing joint pain severe in nature particularly in the back and hips. States it 2 pain tablets per day is not sufficient. Patient's husband with her states she does not get drowsy with the medication.  Patient claims compliance with blood pressure medicine. Medications reviewed today. Watching salt intake.  Recently seen in emergency room for question stroke. Patient had a stable scan. ER notes reviewed and presence of patient and family today. Presumed urine infection was treated patient states she is feeling better.   Urinary Tract Infection  This is a new problem. The current episode started in the past 7 days. Treatments tried: Keflex.   Patient in today for an ER follow up for 03/20/15. Patient was seen at ER for Confusion and UTI symptoms. (Please see ER Note).  Patient states no other concerns this visit.   Review of Systems No current headache no chest pain no new back pain no change in bowel habits no blood in stool ROS otherwise negative    Objective:   Physical Exam Alert vitals stable. HEENT normal. Lungs clear heart regular in rhythm ankles trace edema neurological exam stable with signs of old stroke no new difficulties. Speech actually improved compared to last year     impression 1 hypertension good control discussed #2 chronic pain suboptimum control discussed will increase medication which I think is reasonable #3 UTI discussed under therapy #4 status post stroke no evidence of new stroke discussed plan 25 minutes spent most in discussion medications maintain diet exercise discussed recheck in 3 months W[facility]       Assessment & Plan:

## 2015-03-29 ENCOUNTER — Ambulatory Visit: Payer: Medicare Other | Admitting: Family Medicine

## 2015-04-24 ENCOUNTER — Emergency Department (HOSPITAL_COMMUNITY): Payer: Medicare Other

## 2015-04-24 ENCOUNTER — Encounter (HOSPITAL_COMMUNITY): Payer: Self-pay | Admitting: Emergency Medicine

## 2015-04-24 ENCOUNTER — Emergency Department (HOSPITAL_COMMUNITY)
Admission: EM | Admit: 2015-04-24 | Discharge: 2015-04-24 | Disposition: A | Discharge status: Home / Self Care | Payer: Medicare Other | Attending: Emergency Medicine | Admitting: Emergency Medicine

## 2015-04-24 DIAGNOSIS — J449 Chronic obstructive pulmonary disease, unspecified: Secondary | ICD-10-CM | POA: Diagnosis not present

## 2015-04-24 DIAGNOSIS — K219 Gastro-esophageal reflux disease without esophagitis: Secondary | ICD-10-CM | POA: Diagnosis not present

## 2015-04-24 DIAGNOSIS — R4701 Aphasia: Secondary | ICD-10-CM | POA: Insufficient documentation

## 2015-04-24 DIAGNOSIS — M797 Fibromyalgia: Secondary | ICD-10-CM | POA: Insufficient documentation

## 2015-04-24 DIAGNOSIS — I69320 Aphasia following cerebral infarction: Secondary | ICD-10-CM

## 2015-04-24 DIAGNOSIS — E785 Hyperlipidemia, unspecified: Secondary | ICD-10-CM | POA: Diagnosis not present

## 2015-04-24 DIAGNOSIS — Z79899 Other long term (current) drug therapy: Secondary | ICD-10-CM | POA: Insufficient documentation

## 2015-04-24 DIAGNOSIS — I10 Essential (primary) hypertension: Secondary | ICD-10-CM | POA: Insufficient documentation

## 2015-04-24 DIAGNOSIS — F419 Anxiety disorder, unspecified: Secondary | ICD-10-CM | POA: Diagnosis not present

## 2015-04-24 DIAGNOSIS — F17210 Nicotine dependence, cigarettes, uncomplicated: Secondary | ICD-10-CM | POA: Diagnosis not present

## 2015-04-24 DIAGNOSIS — E039 Hypothyroidism, unspecified: Secondary | ICD-10-CM | POA: Insufficient documentation

## 2015-04-24 DIAGNOSIS — N3000 Acute cystitis without hematuria: Secondary | ICD-10-CM | POA: Diagnosis not present

## 2015-04-24 DIAGNOSIS — B961 Klebsiella pneumoniae [K. pneumoniae] as the cause of diseases classified elsewhere: Secondary | ICD-10-CM | POA: Diagnosis not present

## 2015-04-24 DIAGNOSIS — Z8673 Personal history of transient ischemic attack (TIA), and cerebral infarction without residual deficits: Secondary | ICD-10-CM | POA: Diagnosis not present

## 2015-04-24 DIAGNOSIS — R4781 Slurred speech: Secondary | ICD-10-CM | POA: Diagnosis not present

## 2015-04-24 DIAGNOSIS — R1030 Lower abdominal pain, unspecified: Secondary | ICD-10-CM | POA: Diagnosis present

## 2015-04-24 LAB — COMPREHENSIVE METABOLIC PANEL
ALT: 12 U/L — AB (ref 14–54)
AST: 21 U/L (ref 15–41)
Albumin: 4 g/dL (ref 3.5–5.0)
Alkaline Phosphatase: 58 U/L (ref 38–126)
Anion gap: 7 (ref 5–15)
BUN: 9 mg/dL (ref 6–20)
CHLORIDE: 109 mmol/L (ref 101–111)
CO2: 25 mmol/L (ref 22–32)
CREATININE: 0.66 mg/dL (ref 0.44–1.00)
Calcium: 9.1 mg/dL (ref 8.9–10.3)
GFR calc Af Amer: >60 mL/min (ref >60)
GFR calc non Af Amer: >60 mL/min (ref >60)
Glucose, Bld: 109 mg/dL — ABNORMAL HIGH (ref 65–99)
Potassium: 4 mmol/L (ref 3.5–5.1)
Sodium: 141 mmol/L (ref 135–145)
Total Bilirubin: 0.4 mg/dL (ref 0.3–1.2)
Total Protein: 6.7 g/dL (ref 6.5–8.1)

## 2015-04-24 LAB — CBC WITH DIFFERENTIAL/PLATELET
BASOS ABS: 0.1 10*3/uL (ref 0.0–0.1)
Basophils Relative: 1 %
EOS PCT: 2 %
Eosinophils Absolute: 0.1 10*3/uL (ref 0.0–0.7)
HCT: 32.2 % — ABNORMAL LOW (ref 36.0–46.0)
Hemoglobin: 10 g/dL — ABNORMAL LOW (ref 12.0–15.0)
LYMPHS PCT: 26 %
Lymphs Abs: 2 10*3/uL (ref 0.7–4.0)
MCH: 24.6 pg — ABNORMAL LOW (ref 26.0–34.0)
MCHC: 31.1 g/dL (ref 30.0–36.0)
MCV: 79.3 fL (ref 78.0–100.0)
Monocytes Absolute: 0.4 10*3/uL (ref 0.1–1.0)
Monocytes Relative: 5 %
NEUTROS ABS: 5.1 10*3/uL (ref 1.7–7.7)
NEUTROS PCT: 66 %
PLATELETS: 315 10*3/uL (ref 150–400)
RBC: 4.06 MIL/uL (ref 3.87–5.11)
RDW: 17.9 % — ABNORMAL HIGH (ref 11.5–15.5)
WBC: 7.7 10*3/uL (ref 4.0–10.5)

## 2015-04-24 LAB — URINE MICROSCOPIC-ADD ON
RBC / HPF: NONE SEEN RBC/hpf (ref 0–5)
Squamous Epithelial / LPF: NONE SEEN

## 2015-04-24 LAB — URINALYSIS, ROUTINE W REFLEX MICROSCOPIC
BILIRUBIN URINE: NEGATIVE
GLUCOSE, UA: NEGATIVE mg/dL
HGB URINE DIPSTICK: NEGATIVE
Ketones, ur: NEGATIVE mg/dL
Leukocytes, UA: NEGATIVE
Nitrite: POSITIVE — AB
PH: 6 (ref 5.0–8.0)
Protein, ur: NEGATIVE mg/dL
SPECIFIC GRAVITY, URINE: 1.01 (ref 1.005–1.030)

## 2015-04-24 MED ORDER — LISINOPRIL 10 MG PO TABS
10.0000 mg | ORAL_TABLET | Freq: Once | ORAL | Status: AC
  Administered 2015-04-24: 10 mg via ORAL
  Filled 2015-04-24: qty 1

## 2015-04-24 MED ORDER — CEFPODOXIME PROXETIL 100 MG PO TABS: 100.0000 mg | ORAL_TABLET | Freq: Two times a day (BID) | ORAL | Status: DC

## 2015-04-24 MED ORDER — ACETAMINOPHEN 500 MG PO TABS
1000.0000 mg | ORAL_TABLET | Freq: Once | ORAL | Status: AC
  Administered 2015-04-24: 1000 mg via ORAL
  Filled 2015-04-24: qty 2

## 2015-04-24 MED ORDER — ALPRAZOLAM 0.5 MG PO TABS
0.5000 mg | ORAL_TABLET | Freq: Once | ORAL | Status: AC
  Administered 2015-04-24: 0.5 mg via ORAL
  Filled 2015-04-24: qty 1

## 2015-04-24 NOTE — ED Notes (Signed)
Per family patient has slurred speech and difficulty forming sentences. Per son noticed at 12pm today when he called here. Patient states started at 5 am. Per patient had difficulty swallowing a pill last night and states "It finally went down but it still feels like it's hung." Patient also reports jerking motions in neck and arms. Per son patient told son this morning that "feels like bees ar stinging me in my head." (tingling sensation). Son states that patient has been c/o headaches x1 week. Patient confuse "more than normal" per son. Hx of stroke.

## 2015-04-24 NOTE — Discharge Instructions (Signed)

## 2015-04-24 NOTE — ED Notes (Signed)
Dr Laurena Spies informed of pt being in room and compaints at this time

## 2015-04-24 NOTE — ED Provider Notes (Addendum)
CSN: [id number]     Arrival date & time 04/24/15  1406 History   First MD Initiated Contact with Patient 04/24/15 1429     Chief Complaint  Patient presents with  . Aphasia     (Consider location/radiation/quality/duration/timing/severity/associated sxs/prior Treatment) HPI 74 year old female who presents with aphasia. She has a history of prior CVA with residual aphasia and apraxia, hypertension, hyperlipidemia, and fibromyalgia. History provided by the patient and her family who states that yesterday evening while taking her medications as she felt that she had a pill that was stuck in the back of her throat. She tried to cough it up, but still had the persistent sensation that it was there. She currently does not completely feel that it is stuck anymore, and has been able to take food and fluids without difficulty. At around 5 AM today, she began to notice that she has been having worsening difficulty speaking than where she normally does. She had talked to her son on the phone, who states that she was having difficulty getting her words out more than usual. Her husband states that she has had days like this before in the past and other days that have been better in terms of her speech. Reports associating headache this morning at the back of her head. Not of sudden onset maximal intensity. She denies any fevers, new vision changes, numbness or weakness, gait instability, nausea or vomiting, abdominal pain, chest pain, difficulty breathing cough or other respiratory symptoms. States that she does feel pressure over her suprapubic abdomen, consistent with prior history of urinary tract infections. She also states that she normally takes Xanax during the daytime, and has been without her Xanax for the past 2 days. As a result, she states that she has been very up and jittery, and did not go to sleep last night. States that she also had an episode where she was jerking her arms while awake.    Past  Medical History  Diagnosis Date  . Anxiety   . Hypothyroidism   . Hypertension   . Lumbar spondylosis 01/23/2011  . COPD (chronic obstructive pulmonary disease) (HCC)     MIld  . Hyperlipidemia   . Fibromyalgia   . Gastritis   . Osteoporosis   . Stroke ([place])   . Headache(784.0)   . Apraxia due to stroke   . Aphasia due to stroke   . GERD (gastroesophageal reflux disease)   . Tobacco abuse    Past Surgical History  Procedure Laterality Date  . Cholecystectomy    . Gastric bypass      open  . Knee surgery Left   . Colonoscopy with esophagogastroduodenoscopy (egd)  2006    Dr. Laural Golden: normal upper endoscopy with surgically alterated stomach, normal colon but redundant  . Esophagogastroduodenoscopy  04/06/2014    Dr.Rourk: abnormal esophagus   Family History  Problem Relation Age of Onset  . Heart attack Father   . Colon cancer Brother    Social History  Substance Use Topics  . Smoking status: Light Tobacco Smoker -- 30 years    Types: Cigarettes  . Smokeless tobacco: Never Used     Comment: Smoking Now Every Once & Awhile Per Patient  . Alcohol Use: No   OB History    Gravida Para Term Preterm AB TAB SAB Ectopic Multiple Living   2 2 2       2      Review of Systems 10/14 systems reviewed and are negative other than those  stated in the HPI    Allergies  Levaquin and [place] Medications   Prior to Admission medications   Medication Sig Start Date End Date Taking? Authorizing Provider  acetaminophen (TYLENOL) 325 MG tablet Take 2 tablets (650 mg total) by mouth every 6 (six) hours as needed for mild pain (or Fever >/= 101). 04/30/14  Yes Velvet Bathe, MD  alendronate (FOSAMAX) 70 MG tablet TAKE 1 TABLET BY MOUTH ONCE A WEEK AS DIRECTED Patient taking differently: TAKE 1 TABLET BY MOUTH ONCE A WEEK AS DIRECTED ON TUESDAYS 02/28/15  Yes Mikey Kirschner, MD  ALPRAZolam Duanne Moron) 1 MG tablet Take 1 tablet at bedtime and 1/2 tablet during the day as needed.  03/23/15  Yes Mikey Kirschner, MD  HYDROcodone-acetaminophen (NORCO/VICODIN) 5-325 MG tablet 1 tablet up to TID prn pain 03/23/15  Yes Mikey Kirschner, MD  levothyroxine (SYNTHROID, LEVOTHROID) 112 MCG tablet TAKE 1 TABLET BY MOUTH DAILY AS DIRECTED 12/01/14  Yes Mikey Kirschner, MD  lisinopril (PRINIVIL,ZESTRIL) 10 MG tablet Take 15 mg by mouth daily.  01/09/15  Yes Historical Provider, MD  Misc Natural Products (OSTEO BI-FLEX ADV DOUBLE ST PO) Take 1 tablet by mouth daily.   Yes Historical Provider, MD  Multiple Vitamin (MULTIVITAMIN WITH MINERALS) TABS Take 1 tablet by mouth daily.   Yes Historical Provider, MD  nortriptyline (PAMELOR) 50 MG capsule TAKE ONE CAPSULE AT BEDTIME 01/31/15  Yes Mikey Kirschner, MD  pantoprazole (PROTONIX) 40 MG tablet TAKE 1 TABLET BY MOUTH EVERY DAY **STOP RANITIDINE 12/01/14  Yes Mikey Kirschner, MD  PARoxetine (PAXIL) 40 MG tablet TAKE 1 TABLET BY MOUTH DAILY AS DIRECTED 01/21/15  Yes Mikey Kirschner, MD  pravastatin (PRAVACHOL) 20 MG tablet TAKE 1 TABLET BY MOUTH EVERY DAY 12/01/14  Yes Mikey Kirschner, MD  cefpodoxime (VANTIN) 100 MG tablet Take 1 tablet (100 mg total) by mouth 2 (two) times daily. 04/24/15   Forde Dandy, MD  cephALEXin (KEFLEX) 500 MG capsule Take 1 capsule (500 mg total) by mouth 2 (two) times daily. Patient not taking: Reported on 04/24/2015 03/21/15   Elnora Morrison, MD  clarithromycin (BIAXIN) 500 MG tablet Take 1 tablet (500 mg total) by mouth 3 (three) times daily. Patient not taking: Reported on 03/14/2015 12/31/14   Mikey Kirschner, MD  feeding supplement (BOOST HIGH PROTEIN) LIQD Take 237 mLs by mouth 3 (three) times daily between meals. Patient not taking: Reported on 03/23/2015 04/30/14   Velvet Bathe, MD  hydrOXYzine (ATARAX/VISTARIL) 25 MG tablet TAKE 1 TABLET BY MOUTH EVERY 6 HOURS AS NEEDED FOR ITCHING Patient not taking: Reported on 03/23/2015 02/24/15   Mikey Kirschner, MD  ofloxacin (OCUFLOX) 0.3 % ophthalmic solution INSTILL 5  DROPS IN AFFECTED EAR(S) TWICE DAILY FOR SEVEN DAYS Patient not taking: Reported on 04/24/2015 11/16/14   Mikey Kirschner, MD  tolterodine (DETROL LA) 2 MG 24 hr capsule Take 1 capsule (2 mg total) by mouth daily. For bladder Patient not taking: Reported on 03/23/2015 03/14/15   Nilda Simmer, NP   BP 202/89 mmHg  Pulse 76  Temp(Src) 99.6 F (37.6 C) (Oral)  Resp 20  Ht 5\' 3"  (1.6 m)  Wt 187 lb (84.823 kg)  BMI 33.13 kg/m2  SpO2 99% Physical Exam Physical Exam  Nursing note and vitals reviewed. Constitutional: Well developed, well nourished, non-toxic, and in no acute distress Head: Normocephalic and atraumatic.  Mouth/Throat: Oropharynx is clear and moist.  Neck: Normal range of motion.  Neck supple.  Cardiovascular: Normal rate and regular rhythm.   Pulmonary/Chest: Effort normal and breath sounds normal.  Abdominal: Soft. There is no tenderness. There is no rebound and no guarding.  Musculoskeletal: Normal range of motion.  Skin: Skin is warm and dry.  Psychiatric: Cooperative Neurological:  Alert, oriented to person, place, time, and situation. Memory grossly in tact. Mildly aphasic speech. No dysarthria Cranial nerves: VF are full. Fundoscopic exam-unable to get good visualization of the discs. Pupils are symmetric, and reactive to light. EOMI without nystagmus. No gaze deviation. Facial muscles symmetric with activation. Sensation to light touch over face in tact bilaterally. Hearing grossly in tact. Palate elevates symmetrically. Head turn and shoulder shrug are intact. Tongue midline.  Reflexes defered.  Muscle bulk and tone normal. No pronator drift. Moves all extremities symmetrically. No drift of extremities against gravity. Sensation to light touch is in tact throughout in bilateral upper and lower extremities. Coordination reveals no dysmetria with finger to nose.   ED Course  Procedures (including critical care time) Labs Review Labs Reviewed  CBC WITH  DIFFERENTIAL/PLATELET - Abnormal; Notable for the following:    Hemoglobin 10.0 (*)    HCT 32.2 (*)    MCH 24.6 (*)    RDW 17.9 (*)    All other components within normal limits  COMPREHENSIVE METABOLIC PANEL - Abnormal; Notable for the following:    Glucose, Bld 109 (*)    ALT 12 (*)    All other components within normal limits  URINALYSIS, ROUTINE W REFLEX MICROSCOPIC (NOT AT [facility]) - Abnormal; Notable for the following:    Nitrite POSITIVE (*)    All other components within normal limits  URINE MICROSCOPIC-ADD ON - Abnormal; Notable for the following:    Bacteria, UA MANY (*)    All other components within normal limits  URINE CULTURE    Imaging Review Dg Chest 2 View  04/24/2015  CLINICAL DATA:  Aphasia, choking episode EXAM: CHEST  2 VIEW COMPARISON:  04/26/2014 FINDINGS: Cardiomediastinal silhouette is stable. Mild elevation of the right hemidiaphragm again noted. Surgical clips in left upper quadrant again noted. No acute infiltrate or pulmonary edema. Thoracic spine osteopenia. Mild degenerative changes lumbar spine. IMPRESSION: No active cardiopulmonary disease. Electronically Signed   By: Lahoma Crocker M.D.   On: 04/24/2015 16:51   Ct Head Wo Contrast  04/24/2015  CLINICAL DATA:  Headaches and slurred speech EXAM: CT HEAD WITHOUT CONTRAST TECHNIQUE: Contiguous axial images were obtained from the base of the skull through the vertex without intravenous contrast. COMPARISON:  03/20/2015 FINDINGS: Bony calvarium is intact. No gross soft tissue abnormality is noted. Atrophic changes are again seen as is a prior left parietal infarct. No findings to suggest acute hemorrhage, acute infarction or space-occupying mass lesion are seen. Mild scattered decreased attenuation is noted deep white matter consistent with chronic ischemic change. IMPRESSION: Chronic atrophic and ischemic changes without acute abnormality. Electronically Signed   By: Inez Catalina M.D.   On: 04/24/2015 16:10   I have  personally reviewed and evaluated these images and lab results as part of my medical decision-making.   EKG Interpretation   Date/Time:  Sunday April 24 2015 14:22:47 EST Ventricular Rate:  77 PR Interval:  163 QRS Duration: 146 QT Interval:  415 QTC Calculation: 470 R Axis:   -113 Text Interpretation:  Sinus rhythm RBBB and LAFB Abnormal T, consider  ischemia, lateral leads No significant change since last tracing Confirmed  by ZACKOWSKI  MD, SCOTT [id number])  on 04/24/2015 2:42:31 PM      MDM   Final diagnoses:  Acute cystitis without hematuria  Aphasia as late effect of stroke    74 year old female with history of CVA and residual aphasia and apraxia, hypertension, hyperlipidemia who presents with headache and worsening aphasia. On arrival, she is alert and appropriate. Does have apparent aphasia on presentation, but able to communicate and speaking appropriately. She is hypertensive with systolic blood pressures in the 190s to 200s. She did not take her blood pressure medications this morning, and I also feel that there is likely element of benzodiazepine withdrawal as she has not been able to take her usual doses of Xanax over the course of the past 2 days. She has no new neurological deficits aside from her aphasia. CT head was performed showing no acute intracranial processes, but presence of old stroke changes. I do not feel that presentation today is consistent with new stroke. Basic blood work reveals no major electrolyte or metabolic derangements. A straight cath urine does yields many bacteria and is nitrite positive. Concern for underlying urinary tract infection that may also worsen old stroke symptoms. Patient will have a refill on her Xanax tomorrow morning, and is given a dose of her Xanax here for concern that benzodiazepine withdrawal may play a large role in her symptoms today. She is also given a course of antibiotics for treatment of a urinary tract infection. She will  follow-up closely with her primary care doctor within the next 2-3 days. Strict return instructions are reviewed with the patient and her family members. They express understanding of all discharge instructions for comfortable to plan of care.    Forde Dandy, MD 04/24/15 1811  Forde Dandy, MD 04/24/15 [phone]

## 2015-04-24 NOTE — ED Notes (Signed)
Pt up to bedside commode

## 2015-04-24 NOTE — ED Notes (Signed)
Discharge instructions reviewed with pt - Instructed to start script in am - repeated back and then this nurse discussed with son need to start ABT in am - Pt was wheeled off unit by family

## 2015-04-24 NOTE — ED Notes (Signed)
BP cuff changed as initial one too small

## 2015-04-27 LAB — URINE CULTURE

## 2015-04-28 ENCOUNTER — Telehealth (HOSPITAL_BASED_OUTPATIENT_CLINIC_OR_DEPARTMENT_OTHER): Payer: Self-pay | Admitting: Emergency Medicine

## 2015-04-28 NOTE — Telephone Encounter (Signed)
Post ED Visit - Positive Culture Follow-up  Culture report reviewed by antimicrobial stewardship pharmacist:  [x]  Elenor Quinones, Pharm.D. []  Heide Guile, Pharm.D., BCPS []  Parks Neptune, Pharm.D. []  Alycia Rossetti, Pharm.D., BCPS []  McDonald, Pharm.D., BCPS, AAHIVP []  Legrand Como, Pharm.D., BCPS, AAHIVP []  Milus Glazier, Pharm.D. []  Stephens November, Pharm.D.  Positive urine culture Klebsiella Treated with cefpodoxime, organism sensitive to the same and no further patient follow-up is required at this time.  Hazle Nordmann 04/28/2015, 10:04 AM

## 2015-04-29 ENCOUNTER — Ambulatory Visit (INDEPENDENT_AMBULATORY_CARE_PROVIDER_SITE_OTHER): Payer: Medicare Other | Admitting: Family Medicine

## 2015-04-29 ENCOUNTER — Encounter: Payer: Self-pay | Admitting: Family Medicine

## 2015-04-29 VITALS — BP 134/76 | Ht 62.0 in | Wt 193.4 lb

## 2015-04-29 DIAGNOSIS — G458 Other transient cerebral ischemic attacks and related syndromes: Secondary | ICD-10-CM

## 2015-04-29 NOTE — Progress Notes (Signed)
   Subjective:    Patient ID: [name], female    DOB: [birth year], 74 y.o.   MRN: [id number]  HPI Patient is here today for a ER follow up visit. Patient was seen and treated at [facility] ER on 04/24/15. Notes are in the system. Patient states she was concerned she was having another stroke.  Patient temporarily had a worsening of her speech. Neuro imaging reviewed today with patient revealed no new stroke. Next  Workup did reveal some white blood cells in urine and patient was started on antibiotic. Of note however culture has returned negative.   Family reports that speech symptoms have returned pretty much to normal  Patient has no other concerns at this time.     Review of Systems No headache no chest pain no back pain no abdominal pain no change in bowel habits    Objective:   Physical Exam  Alert vitals stable. HEENT normal lungs clear heart regular in rhythm neuro exam intact      Assessment & Plan:  Impression 1 potential TIA #2 initial UTI concern now with negative culture plan may stop antibiotics maintain same medications diet exercise discussed follow-up as scheduled all hospital notes reviewed today and presents the patient easily 30 minutes spent most in discussion WSL

## 2015-05-25 ENCOUNTER — Other Ambulatory Visit: Payer: Self-pay | Admitting: Family Medicine

## 2015-05-30 ENCOUNTER — Ambulatory Visit: Payer: Medicare Other | Admitting: Family Medicine

## 2015-06-02 ENCOUNTER — Encounter: Payer: Self-pay | Admitting: Family Medicine

## 2015-06-02 ENCOUNTER — Ambulatory Visit (INDEPENDENT_AMBULATORY_CARE_PROVIDER_SITE_OTHER): Payer: Medicare Other | Admitting: Family Medicine

## 2015-06-02 VITALS — BP 138/84 | Ht 62.0 in | Wt 193.0 lb

## 2015-06-02 DIAGNOSIS — M25511 Pain in right shoulder: Secondary | ICD-10-CM | POA: Diagnosis not present

## 2015-06-02 DIAGNOSIS — E039 Hypothyroidism, unspecified: Secondary | ICD-10-CM

## 2015-06-02 DIAGNOSIS — M25561 Pain in right knee: Secondary | ICD-10-CM

## 2015-06-02 DIAGNOSIS — E785 Hyperlipidemia, unspecified: Secondary | ICD-10-CM

## 2015-06-02 DIAGNOSIS — I10 Essential (primary) hypertension: Secondary | ICD-10-CM | POA: Diagnosis not present

## 2015-06-02 MED ORDER — HYDROCODONE-ACETAMINOPHEN 5-325 MG PO TABS: ORAL_TABLET | ORAL | Status: DC

## 2015-06-02 NOTE — Progress Notes (Signed)
   Subjective:    Patient ID: [name], female    DOB: 1941-02-23, 75 y.o.   MRN: [id number]  HPI This patient was seen today for chronic pain.   Pain mostly in legs and some in arms.   The medication list was reviewed and updated.   -Compliance with medication: takes as prescribed   - Number patient states they take daily: 3 a day.   -when was the last dose patient took? This morning  The patient was advised the importance of maintaining medication and not using illegal substances with these.  Refills needed: yes  The patient was educated that we can provide 3 monthly scripts for their medication, it is their responsibility to follow the instructions.  Side effects or complications from medications: none  Patient is aware that pain medications are meant to minimize the severity of the pain to allow their pain levels to improve to allow for better function. They are aware of that pain medications cannot totally remove their pain.  Due for UDT ( at least once per year) :   Pt states no concerns today.     no new stroke symptomatology. Speech somewhat better according to family members.   compliant with blood pressure medication. Meds reviewed today. Does not miss doses. Watching salt intake.    States A[place]ley needs chronic pain medication.   Review of Systems  no headache no chest pain  No abdominal pain no change in bowel habits ROS otherwise negative    Objective:   Physical Exam   alert vital stable no  newfocal neurological deficits. Vital stable blood pressure improved on repeat HEENT normal lungs clear heart regular in rhythm      Assessment & Plan:   impression 1 hypertension good as reviewed maintain same #2 status post stroke clinically stable no recurrence #3 chronic pain discussed with need for ongoing meds plan pain medicines written. Diet exercise discussed. Other meds refilled. Recheck in several months warning signs discussed WSL

## 2015-06-15 ENCOUNTER — Other Ambulatory Visit: Payer: Self-pay | Admitting: Family Medicine

## 2015-06-23 ENCOUNTER — Ambulatory Visit: Payer: Medicare Other | Admitting: Family Medicine

## 2015-07-04 ENCOUNTER — Telehealth: Payer: Self-pay | Admitting: Family Medicine

## 2015-07-04 NOTE — Telephone Encounter (Signed)
Pt called stating that she fell and hurt her back. Pt has took more than prescribed of her hydrocodone and is out. Pt is unable to get hers filled until the 26th. Pt wants to know if the Dr. Aletha Halim increase her dosage to 4 a day instead of 3 a day so that she can get them filled early. I had to speak to son due to the pt being unable to fully get a sentence out.

## 2015-07-04 NOTE — Telephone Encounter (Signed)
Pt unable to talk sec to stroke, change to 120 one up to qid prn pain, one rx

## 2015-07-05 ENCOUNTER — Other Ambulatory Visit: Payer: Self-pay | Admitting: *Deleted

## 2015-07-05 MED ORDER — HYDROCODONE-ACETAMINOPHEN 5-325 MG PO TABS: ORAL_TABLET | ORAL | Status: DC

## 2015-07-05 NOTE — Telephone Encounter (Signed)
Script ready. Pt notified.  

## 2015-07-07 ENCOUNTER — Other Ambulatory Visit: Payer: Self-pay | Admitting: Family Medicine

## 2015-07-07 NOTE — Telephone Encounter (Signed)
May we refill Paxil

## 2015-07-13 ENCOUNTER — Other Ambulatory Visit: Payer: Self-pay | Admitting: Family Medicine

## 2015-07-20 ENCOUNTER — Other Ambulatory Visit: Payer: Self-pay | Admitting: Family Medicine

## 2015-07-21 ENCOUNTER — Ambulatory Visit: Payer: Medicare Other | Admitting: Family Medicine

## 2015-08-02 ENCOUNTER — Ambulatory Visit (INDEPENDENT_AMBULATORY_CARE_PROVIDER_SITE_OTHER): Payer: Medicare Other | Admitting: Family Medicine

## 2015-08-02 ENCOUNTER — Encounter: Payer: Self-pay | Admitting: Family Medicine

## 2015-08-02 VITALS — BP 114/70 | Ht 62.0 in | Wt 188.1 lb

## 2015-08-02 DIAGNOSIS — Z79899 Other long term (current) drug therapy: Secondary | ICD-10-CM | POA: Diagnosis not present

## 2015-08-02 DIAGNOSIS — I10 Essential (primary) hypertension: Secondary | ICD-10-CM

## 2015-08-02 DIAGNOSIS — M25511 Pain in right shoulder: Secondary | ICD-10-CM

## 2015-08-02 DIAGNOSIS — E039 Hypothyroidism, unspecified: Secondary | ICD-10-CM | POA: Diagnosis not present

## 2015-08-02 DIAGNOSIS — E785 Hyperlipidemia, unspecified: Secondary | ICD-10-CM | POA: Diagnosis not present

## 2015-08-02 DIAGNOSIS — R5383 Other fatigue: Secondary | ICD-10-CM

## 2015-08-02 MED ORDER — HYDROCODONE-ACETAMINOPHEN 5-325 MG PO TABS: ORAL_TABLET | ORAL | Status: DC

## 2015-08-02 MED ORDER — LISINOPRIL 10 MG PO TABS: 5.0000 mg | ORAL_TABLET | Freq: Every day | ORAL | Status: DC

## 2015-08-02 MED ORDER — HYDROCODONE-ACETAMINOPHEN 5-325 MG PO TABS
ORAL_TABLET | ORAL | Status: DC
Start: 2015-08-02 — End: 2015-08-02

## 2015-08-02 NOTE — Progress Notes (Signed)
   Subjective:    Patient ID: [name], female    DOB: 27-Nov-1940, 75 y.o.   MRN: [id number]  HPI This patient was seen today for chronic pain  The medication list was reviewed and updated.   -Compliance with medication: yes  - Number patient states they take daily: up to QID  -when was the last dose patient took: today   The patient was advised the importance of maintaining medication and not using illegal substances with these.  Refills needed: yes  The patient was educated that we can provide 3 monthly scripts for their medication, it is their responsibility to follow the instructions.  Side effects or complications from medications: none  Patient is aware that pain medications are meant to minimize the severity of the pain to allow their pain levels to improve to allow for better function. They are aware of that pain medications cannot totally remove their pain.  Due for UDT ( at least once per year) : utd  Patient is having bilateral arm pain she would like to talk with the doctor about today.  Pain fairly severe in the shoulders. More in the left arm than the right     compliant with blood pressure medicine. Medicines reviewed today. Taking 1 Zestril tablet daily. 10 mg. Lightheadedness when standing quickly.    ongoing challenges with anxiety and depression reports Paxil is helping.    Review of Systems  no headache no chest pain no additional falls no shortness breath no abdominal pain    Objective:   Physical Exam   alert vital stable HET normal. Lungs clear heart rhythm shoulders positive impingement sign both shoulders ankles trace edema blood pressure on repeat 108/70      Assessment & Plan:   impression hypertension control 2 type particularly in light of occasional dizziness and falls #2 bilateral shoulder bursitis discussed #3 depression anxiety clinically stable current meds discussed maintain same #4 hyperlipidemia status uncertain old levels  reviewed plan appropriate blood work. Decrease lisinopril to 5 mg daily. Offered injection of shoulders patient declined. Pain medication prescribed WSL

## 2015-08-02 NOTE — Patient Instructions (Signed)

## 2015-08-05 ENCOUNTER — Telehealth: Payer: Self-pay | Admitting: Family Medicine

## 2015-08-05 DIAGNOSIS — Z79899 Other long term (current) drug therapy: Secondary | ICD-10-CM | POA: Diagnosis not present

## 2015-08-05 DIAGNOSIS — E785 Hyperlipidemia, unspecified: Secondary | ICD-10-CM | POA: Diagnosis not present

## 2015-08-05 DIAGNOSIS — R5383 Other fatigue: Secondary | ICD-10-CM | POA: Diagnosis not present

## 2015-08-05 NOTE — Telephone Encounter (Signed)
Form for handicap placard in your yellow folder in your office

## 2015-08-06 ENCOUNTER — Encounter: Payer: Self-pay | Admitting: Family Medicine

## 2015-08-06 LAB — HEPATIC FUNCTION PANEL
ALK PHOS: 69 IU/L (ref 39–117)
ALT: 11 IU/L (ref 0–32)
AST: 22 IU/L (ref 0–40)
Albumin: 4 g/dL (ref 3.5–4.8)
BILIRUBIN TOTAL: 0.2 mg/dL (ref 0.0–1.2)
BILIRUBIN, DIRECT: 0.07 mg/dL (ref 0.00–0.40)
Total Protein: 6.2 g/dL (ref 6.0–8.5)

## 2015-08-06 LAB — BASIC METABOLIC PANEL
BUN / CREAT RATIO: 20 (ref 11–26)
BUN: 15 mg/dL (ref 8–27)
CO2: 21 mmol/L (ref 18–29)
CREATININE: 0.75 mg/dL (ref 0.57–1.00)
Calcium: 8.9 mg/dL (ref 8.7–10.3)
Chloride: 103 mmol/L (ref 96–106)
GFR calc non Af Amer: 78 mL/min/{1.73_m2} (ref >59)
GFR, EST AFRICAN AMERICAN: 90 mL/min/{1.73_m2} (ref >59)
GLUCOSE: 88 mg/dL (ref 65–99)
Potassium: 5.2 mmol/L (ref 3.5–5.2)
SODIUM: 139 mmol/L (ref 134–144)

## 2015-08-06 LAB — LIPID PANEL
CHOL/HDL RATIO: 3 ratio (ref 0.0–4.4)
Cholesterol, Total: 179 mg/dL (ref 100–199)
HDL: 60 mg/dL (ref >39)
LDL Calculated: 90 mg/dL (ref 0–99)
TRIGLYCERIDES: 143 mg/dL (ref 0–149)
VLDL Cholesterol Cal: 29 mg/dL (ref 5–40)

## 2015-08-06 LAB — TSH: TSH: 1.75 u[IU]/mL (ref 0.450–4.500)

## 2015-08-15 NOTE — Telephone Encounter (Signed)
Placard ready for pick up

## 2015-08-19 ENCOUNTER — Ambulatory Visit (INDEPENDENT_AMBULATORY_CARE_PROVIDER_SITE_OTHER): Payer: Medicare Other | Admitting: Family Medicine

## 2015-08-19 VITALS — Ht 62.0 in | Wt 188.0 lb

## 2015-08-19 DIAGNOSIS — M7541 Impingement syndrome of right shoulder: Secondary | ICD-10-CM | POA: Diagnosis not present

## 2015-08-19 DIAGNOSIS — M25512 Pain in left shoulder: Secondary | ICD-10-CM

## 2015-08-19 DIAGNOSIS — M7542 Impingement syndrome of left shoulder: Secondary | ICD-10-CM

## 2015-08-19 DIAGNOSIS — M25511 Pain in right shoulder: Secondary | ICD-10-CM

## 2015-08-19 MED ORDER — METHYLPREDNISOLONE ACETATE 40 MG/ML IJ SUSP: 40.0000 mg | Freq: Once | INTRAMUSCULAR | Status: DC

## 2015-08-19 NOTE — Progress Notes (Signed)
   Subjective:    Patient ID: KHAMAYA FIRST, female    DOB: 1940/07/28, 75 y.o.   MRN: [id number]  HPI  Patient arrives for steroid injections in shoulders. Patient has bilateral impingement of both shoulders. Unfortunately range of motion exercises along with pain medication is not cutting it ongoing challenges would like an injection  Review of Systems No headache, no major weight loss or weight gain, no chest pain no back pain abdominal pain no change in bowel habits complete ROS otherwise negative     Objective:   Physical Exam Alert vital stable lungs clear heart regular in rhythm next  Patient was prepped draped sterilize anesthetized and injected bilateral with a posterior lateral approach with 1 mL.Depo-Medrol 2 mL Xylocaine       Assessment & Plan:  Impression bilateral shoulder injections plan local measures discussed

## 2015-08-27 ENCOUNTER — Other Ambulatory Visit: Payer: Self-pay | Admitting: Family Medicine

## 2015-09-19 ENCOUNTER — Other Ambulatory Visit: Payer: Self-pay | Admitting: Family Medicine

## 2015-09-20 ENCOUNTER — Telehealth: Payer: Self-pay | Admitting: Family Medicine

## 2015-09-20 ENCOUNTER — Other Ambulatory Visit: Payer: Self-pay | Admitting: *Deleted

## 2015-09-20 MED ORDER — ALPRAZOLAM 1 MG PO TABS: ORAL_TABLET | ORAL | Status: DC

## 2015-09-20 NOTE — Telephone Encounter (Signed)
Requesting Rx for ALPRAZolam Duanne Moron) 1 MG tablet    [facility]

## 2015-09-20 NOTE — Telephone Encounter (Signed)
May had this and 4 refills

## 2015-09-20 NOTE — Telephone Encounter (Signed)
Rx faxed to pharmacy. Patient notified. 

## 2015-09-27 ENCOUNTER — Other Ambulatory Visit: Payer: Self-pay | Admitting: Family Medicine

## 2015-10-13 ENCOUNTER — Telehealth: Payer: Self-pay | Admitting: Family Medicine

## 2015-10-13 DIAGNOSIS — M25569 Pain in unspecified knee: Secondary | ICD-10-CM

## 2015-10-13 NOTE — Telephone Encounter (Signed)
Let's do 

## 2015-10-13 NOTE — Telephone Encounter (Signed)
Pt is requesting a referral to dr Luna Glasgow for right knee pain.

## 2015-10-14 ENCOUNTER — Encounter: Payer: Self-pay | Admitting: Family Medicine

## 2015-10-14 NOTE — Telephone Encounter (Signed)
Spoke with patient's husband and informed her per Dr.Steve Luking- Referral was put in for Dr.Keeling for right knee pain. Patient verbalized understanding.

## 2015-10-20 ENCOUNTER — Ambulatory Visit (INDEPENDENT_AMBULATORY_CARE_PROVIDER_SITE_OTHER): Payer: Medicare Other | Admitting: Orthopaedic Surgery

## 2015-10-20 ENCOUNTER — Ambulatory Visit (INDEPENDENT_AMBULATORY_CARE_PROVIDER_SITE_OTHER): Payer: Medicare Other

## 2015-10-20 ENCOUNTER — Encounter: Payer: Self-pay | Admitting: Orthopaedic Surgery

## 2015-10-20 ENCOUNTER — Other Ambulatory Visit: Payer: Self-pay | Admitting: Family Medicine

## 2015-10-20 VITALS — BP 122/57 | HR 88 | Temp 97.5°F | Resp 16 | Ht 61.0 in | Wt 192.0 lb

## 2015-10-20 DIAGNOSIS — E039 Hypothyroidism, unspecified: Secondary | ICD-10-CM | POA: Diagnosis not present

## 2015-10-20 DIAGNOSIS — I6350 Cerebral infarction due to unspecified occlusion or stenosis of unspecified cerebral artery: Secondary | ICD-10-CM | POA: Diagnosis not present

## 2015-10-20 DIAGNOSIS — K219 Gastro-esophageal reflux disease without esophagitis: Secondary | ICD-10-CM

## 2015-10-20 DIAGNOSIS — M797 Fibromyalgia: Secondary | ICD-10-CM | POA: Diagnosis not present

## 2015-10-20 DIAGNOSIS — M25561 Pain in right knee: Secondary | ICD-10-CM
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
## 2015-11-01 ENCOUNTER — Ambulatory Visit: Payer: Medicare Other | Admitting: Orthopedic Surgery

## 2015-11-02 ENCOUNTER — Ambulatory Visit (INDEPENDENT_AMBULATORY_CARE_PROVIDER_SITE_OTHER): Payer: Medicare Other | Admitting: Family Medicine

## 2015-11-02 ENCOUNTER — Encounter: Payer: Self-pay | Admitting: Family Medicine

## 2015-11-02 VITALS — BP 132/62 | Ht 62.0 in | Wt 194.1 lb

## 2015-11-02 DIAGNOSIS — I6350 Cerebral infarction due to unspecified occlusion or stenosis of unspecified cerebral artery: Secondary | ICD-10-CM | POA: Diagnosis not present

## 2015-11-02 DIAGNOSIS — R5383 Other fatigue: Secondary | ICD-10-CM

## 2015-11-02 DIAGNOSIS — E785 Hyperlipidemia, unspecified: Secondary | ICD-10-CM

## 2015-11-02 DIAGNOSIS — I10 Essential (primary) hypertension: Secondary | ICD-10-CM | POA: Diagnosis not present

## 2015-11-02 DIAGNOSIS — M25511 Pain in right shoulder: Secondary | ICD-10-CM

## 2015-11-02 DIAGNOSIS — M25512 Pain in left shoulder: Secondary | ICD-10-CM

## 2015-11-02 MED ORDER — HYDROCODONE-ACETAMINOPHEN 5-325 MG PO TABS: ORAL_TABLET | ORAL | Status: DC

## 2015-11-02 NOTE — Progress Notes (Signed)
   Subjective:    Patient ID: [name], female    DOB: 1940-12-12, 75 y.o.   MRN: [id number]  HPI This patient was seen today for chronic pain  The medication list was reviewed and updated.   -Compliance with medication: Takes daily  - Number patient states they take daily: States takes 3 tablets daily.  -when was the last dose patient took? States took it today at 1000  The patient was advised the importance of maintaining medication and not using illegal substances with these.  Refills needed: Yes  The patient was educated that we can provide 3 monthly scripts for their medication, it is their responsibility to follow the instructions.  Side effects or complications from medications: None  Patient is aware that pain medications are meant to minimize the severity of the pain to allow their pain levels to improve to allow for better function. They are aware of that pain medications cannot totally remove their pain.  Due for UDT ( at least once per year) : Due as of 10/12/15    BP132 over 62   Review of Systems No headache, no major weight loss or weight gain, no chest pain no back pain abdominal pain no change in bowel habits complete ROS otherwise negative     Objective:   Physical Exam  Alert vital stable positive back pain percussion shoulder positive impingement sign bilateral even wonders element of frozen shoulder lungs clear heart rare rhythm ankles trace edema      Assessment & Plan:  Impression 1 chronic anxiety patient wishes to bump of Xanax I declined because of #2 #2 chronic pain with multiple doses meds. #3 progressive shoulder pain despite injections #4 hyperlipidemia #5 hypertension good control plan orthopedic referral. Pain medications refilled. Diet exercise discuss other meds refilled [facility]

## 2015-11-02 NOTE — Addendum Note (Signed)
Addended by: Mikey Kirschner on: 11/02/2015 06:24 PM   Modules accepted: Orders

## 2015-11-07 ENCOUNTER — Encounter: Payer: Self-pay | Admitting: Family Medicine

## 2015-11-22 ENCOUNTER — Telehealth: Payer: Self-pay | Admitting: Orthopaedic Surgery

## 2015-11-22 NOTE — Telephone Encounter (Signed)
Patient called and stated that she didn't want to have surgery.  Her appointment for 11-01-15 had already been canceled.  I told her to call us when she wants to be rescheduled and she said she would.

## 2015-12-15 ENCOUNTER — Other Ambulatory Visit: Payer: Self-pay | Admitting: Family Medicine

## 2015-12-17 ENCOUNTER — Other Ambulatory Visit: Payer: Self-pay | Admitting: Family Medicine

## 2016-01-03 ENCOUNTER — Other Ambulatory Visit: Payer: Self-pay | Admitting: Family Medicine

## 2016-01-13 ENCOUNTER — Ambulatory Visit (INDEPENDENT_AMBULATORY_CARE_PROVIDER_SITE_OTHER): Payer: Medicare Other | Admitting: Nurse Practitioner

## 2016-01-13 ENCOUNTER — Encounter: Payer: Self-pay | Admitting: Nurse Practitioner

## 2016-01-13 VITALS — BP 130/80 | Ht 62.0 in | Wt 190.0 lb

## 2016-01-13 DIAGNOSIS — D235 Other benign neoplasm of skin of trunk: Secondary | ICD-10-CM

## 2016-01-13 DIAGNOSIS — L821 Other seborrheic keratosis: Secondary | ICD-10-CM

## 2016-01-13 DIAGNOSIS — D2260 Melanocytic nevi of unspecified upper limb, including shoulder: Secondary | ICD-10-CM

## 2016-01-13 DIAGNOSIS — I6350 Cerebral infarction due to unspecified occlusion or stenosis of unspecified cerebral artery: Secondary | ICD-10-CM

## 2016-01-14 ENCOUNTER — Encounter: Payer: Self-pay | Admitting: Nurse Practitioner

## 2016-01-14 DIAGNOSIS — L821 Other seborrheic keratosis: Secondary | ICD-10-CM | POA: Insufficient documentation

## 2016-01-14 NOTE — Progress Notes (Signed)
Subjective:  Presents for evaluation of several skin lesions. Has had an area removed from her nose in the past at Dr. Juel Burrow office. History of sun exposure. No bleeding or major changes in lesions.  Objective:   BP 130/80   Ht 5\' 2"  (1.575 m)   Wt 190 lb (86.2 kg)   BMI 34.75 kg/m  NAD. Alert, oriented. Several seborrheic keratoses noted in various stages of development. A raised well-defined dark atypical lesion noted on the left upper chest wall near the clavicle. None of the areas are inflamed at this time. Lungs clear. Heart regular rate rhythm.  Assessment:  Problem List Items Addressed This Visit      Musculoskeletal and Integument   Seborrheic keratoses    Other Visit Diagnoses    Atypical nevus of clavicle    -  Primary   Relevant Orders   Ambulatory referral to Dermatology     Plan: Refer back to dermatology for evaluation of lesions as well as a skin cancer screening. Patient to call back sooner if needed.

## 2016-02-02 ENCOUNTER — Encounter: Payer: Self-pay | Admitting: Family Medicine

## 2016-02-02 ENCOUNTER — Ambulatory Visit (INDEPENDENT_AMBULATORY_CARE_PROVIDER_SITE_OTHER): Payer: Medicare Other | Admitting: Family Medicine

## 2016-02-02 VITALS — BP 126/74 | Ht 62.0 in | Wt 189.1 lb

## 2016-02-02 DIAGNOSIS — M25512 Pain in left shoulder: Secondary | ICD-10-CM

## 2016-02-02 DIAGNOSIS — E785 Hyperlipidemia, unspecified: Secondary | ICD-10-CM | POA: Diagnosis not present

## 2016-02-02 DIAGNOSIS — Z23 Encounter for immunization: Secondary | ICD-10-CM

## 2016-02-02 DIAGNOSIS — I6350 Cerebral infarction due to unspecified occlusion or stenosis of unspecified cerebral artery: Secondary | ICD-10-CM | POA: Diagnosis not present

## 2016-02-02 DIAGNOSIS — M25511 Pain in right shoulder: Secondary | ICD-10-CM | POA: Diagnosis not present

## 2016-02-02 DIAGNOSIS — M7541 Impingement syndrome of right shoulder: Secondary | ICD-10-CM | POA: Diagnosis not present

## 2016-02-02 DIAGNOSIS — I10 Essential (primary) hypertension: Secondary | ICD-10-CM | POA: Diagnosis not present

## 2016-02-02 MED ORDER — ALPRAZOLAM 1 MG PO TABS: ORAL_TABLET | ORAL | 3 refills | Status: DC

## 2016-02-02 MED ORDER — HYDROCODONE-ACETAMINOPHEN 5-325 MG PO TABS: ORAL_TABLET | ORAL | 0 refills | Status: DC

## 2016-02-02 NOTE — Progress Notes (Signed)
   Subjective:    Patient ID: [name], female    DOB: 06-Dec-1940, 75 y.o.   MRN: [id number]  HPI This patient was seen today for chronic pain  The medication list was reviewed and updated.   -Compliance with medication: Takes daily   - Number patient states they take daily: Takes up to 4 tablets daily   -when was the last dose patient took? Last took today 1130  The patient was advised the importance of maintaining medication and not using illegal substances with these.  Refills needed: Yes The patient was educated that we can provide 3 monthly scripts for their medication, it is their responsibility to follow the instructions.  Side effects or complications from medications: NOne   Patient is aware that pain medications are meant to minimize the severity of the pain to allow their pain levels to improve to allow for better function. They are aware of that pain medications cannot totally remove their pain.  Due for UDT ( at least once per year) :      Blood pressure medicine and blood pressure levels reviewed today with patient. Compliant with blood pressure medicine. States does not miss a dose. No obvious side effects. Blood pressure generally good when checked elsewhere. Watching salt intake.  Patient reports chills or starting to get stuck when she swallows. Generally not too. Has not had any coughing spells. History of upper endoscopy but with no dilation in the past   Review of Systems No headache, no major weight loss or weight gain, no chest pain no back pain abdominal pain no change in bowel habits complete ROS otherwise negative     Objective:   Physical Exam Alert vital stable baseline a fascia present positive left shoulder impingement sign lungs clear heart regular in rhythm ankles without edema blood pressure good on repeat.       Assessment & Plan:  Impression 1 status post stroke #2 chronic pain discussed meds refilled #3 chronic anxiety discussed  patient once again and requests increase dose advised not to do that #4 occasional pill dysphagia but nothing else will watch for now discussed plan flu shot. Medications refilled. Pain medicines written follow-up as scheduled [facility]

## 2016-02-13 DIAGNOSIS — E785 Hyperlipidemia, unspecified: Secondary | ICD-10-CM | POA: Diagnosis not present

## 2016-02-13 DIAGNOSIS — I10 Essential (primary) hypertension: Secondary | ICD-10-CM | POA: Diagnosis not present

## 2016-02-14 LAB — HEPATIC FUNCTION PANEL
ALT: 7 IU/L (ref 0–32)
AST: 17 IU/L (ref 0–40)
Albumin: 4.2 g/dL (ref 3.5–4.8)
Alkaline Phosphatase: 64 IU/L (ref 39–117)
BILIRUBIN TOTAL: 0.2 mg/dL (ref 0.0–1.2)
Bilirubin, Direct: 0.08 mg/dL (ref 0.00–0.40)
Total Protein: 6.4 g/dL (ref 6.0–8.5)

## 2016-02-14 LAB — BASIC METABOLIC PANEL
BUN/Creatinine Ratio: 13 (ref 12–28)
BUN: 9 mg/dL (ref 8–27)
CALCIUM: 9.2 mg/dL (ref 8.7–10.3)
CHLORIDE: 103 mmol/L (ref 96–106)
CO2: 24 mmol/L (ref 18–29)
Creatinine, Ser: 0.67 mg/dL (ref 0.57–1.00)
GFR calc non Af Amer: 86 mL/min/{1.73_m2} (ref >59)
GFR, EST AFRICAN AMERICAN: 99 mL/min/{1.73_m2} (ref >59)
GLUCOSE: 101 mg/dL — AB (ref 65–99)
POTASSIUM: 5.1 mmol/L (ref 3.5–5.2)
Sodium: 143 mmol/L (ref 134–144)

## 2016-02-14 LAB — LIPID PANEL
CHOL/HDL RATIO: 2.6 ratio (ref 0.0–4.4)
CHOLESTEROL TOTAL: 188 mg/dL (ref 100–199)
HDL: 73 mg/dL (ref >39)
LDL CALC: 93 mg/dL (ref 0–99)
TRIGLYCERIDES: 109 mg/dL (ref 0–149)
VLDL CHOLESTEROL CAL: 22 mg/dL (ref 5–40)

## 2016-02-19 ENCOUNTER — Encounter: Payer: Self-pay | Admitting: Family Medicine

## 2016-03-02 ENCOUNTER — Other Ambulatory Visit: Payer: Self-pay | Admitting: Family Medicine

## 2016-03-09 ENCOUNTER — Other Ambulatory Visit: Payer: Self-pay | Admitting: Family Medicine

## 2016-03-13 ENCOUNTER — Other Ambulatory Visit: Payer: Self-pay | Admitting: Family Medicine

## 2016-03-17 ENCOUNTER — Other Ambulatory Visit: Payer: Self-pay | Admitting: Family Medicine

## 2016-04-24 ENCOUNTER — Other Ambulatory Visit: Payer: Self-pay | Admitting: Family Medicine

## 2016-04-25 IMAGING — CR DG ABDOMEN ACUTE W/ 1V CHEST
2 series · 3 of 3 positions shown · non-contrast
Comparison: March 29, 2014.

CLINICAL DATA: Diarrhea.

EXAM:
ACUTE ABDOMEN SERIES (ABDOMEN 2 VIEW & CHEST 1 VIEW)

[ap portable]
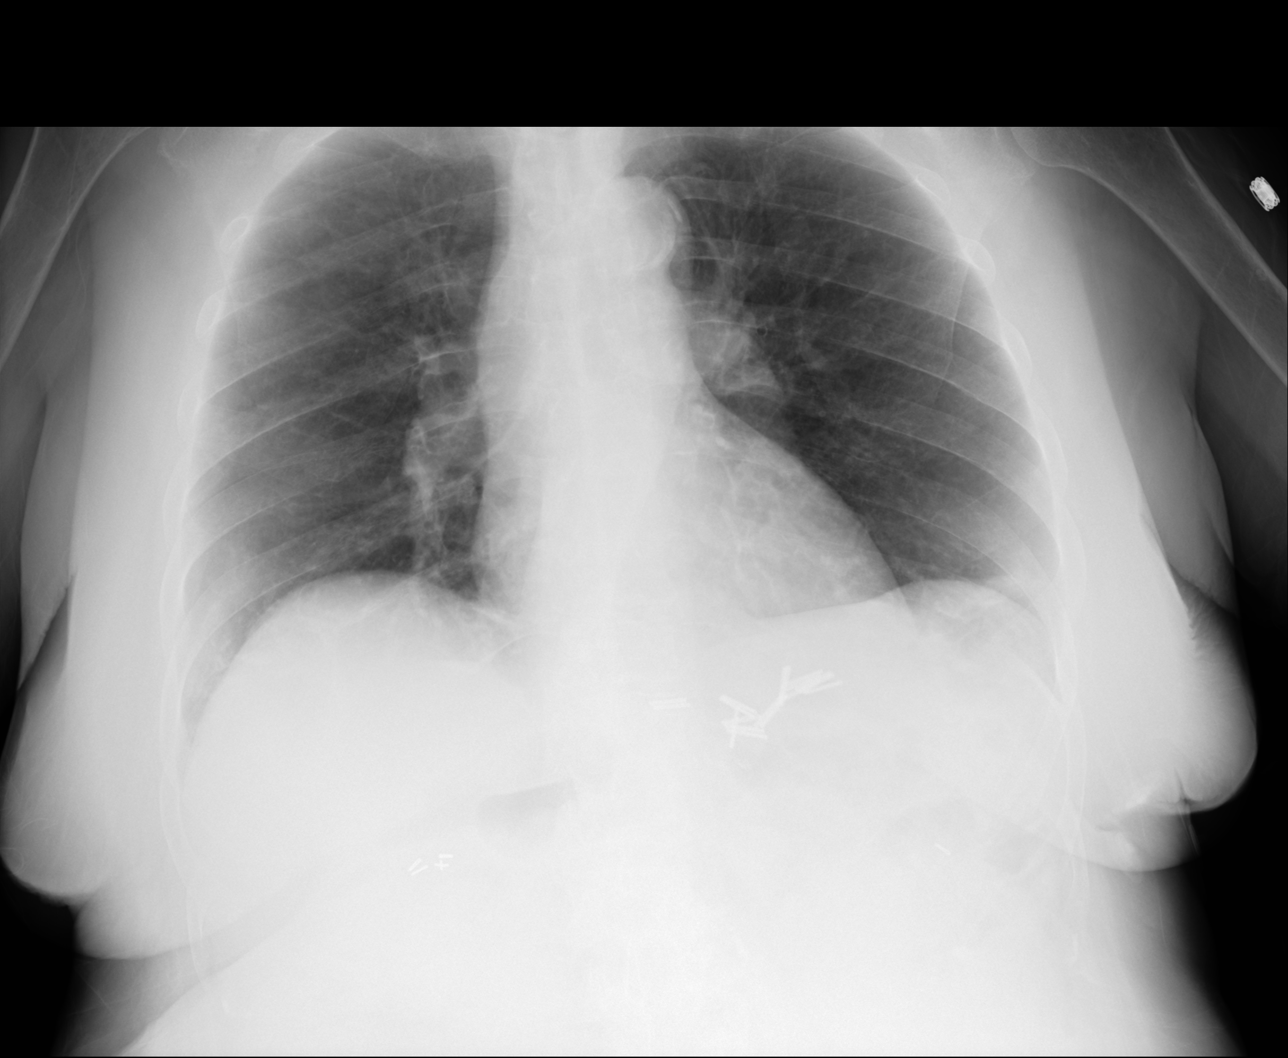

[Series 2: supine ap · 0.17mm/px · 2 of 2 slices shown]
[im 1/2]
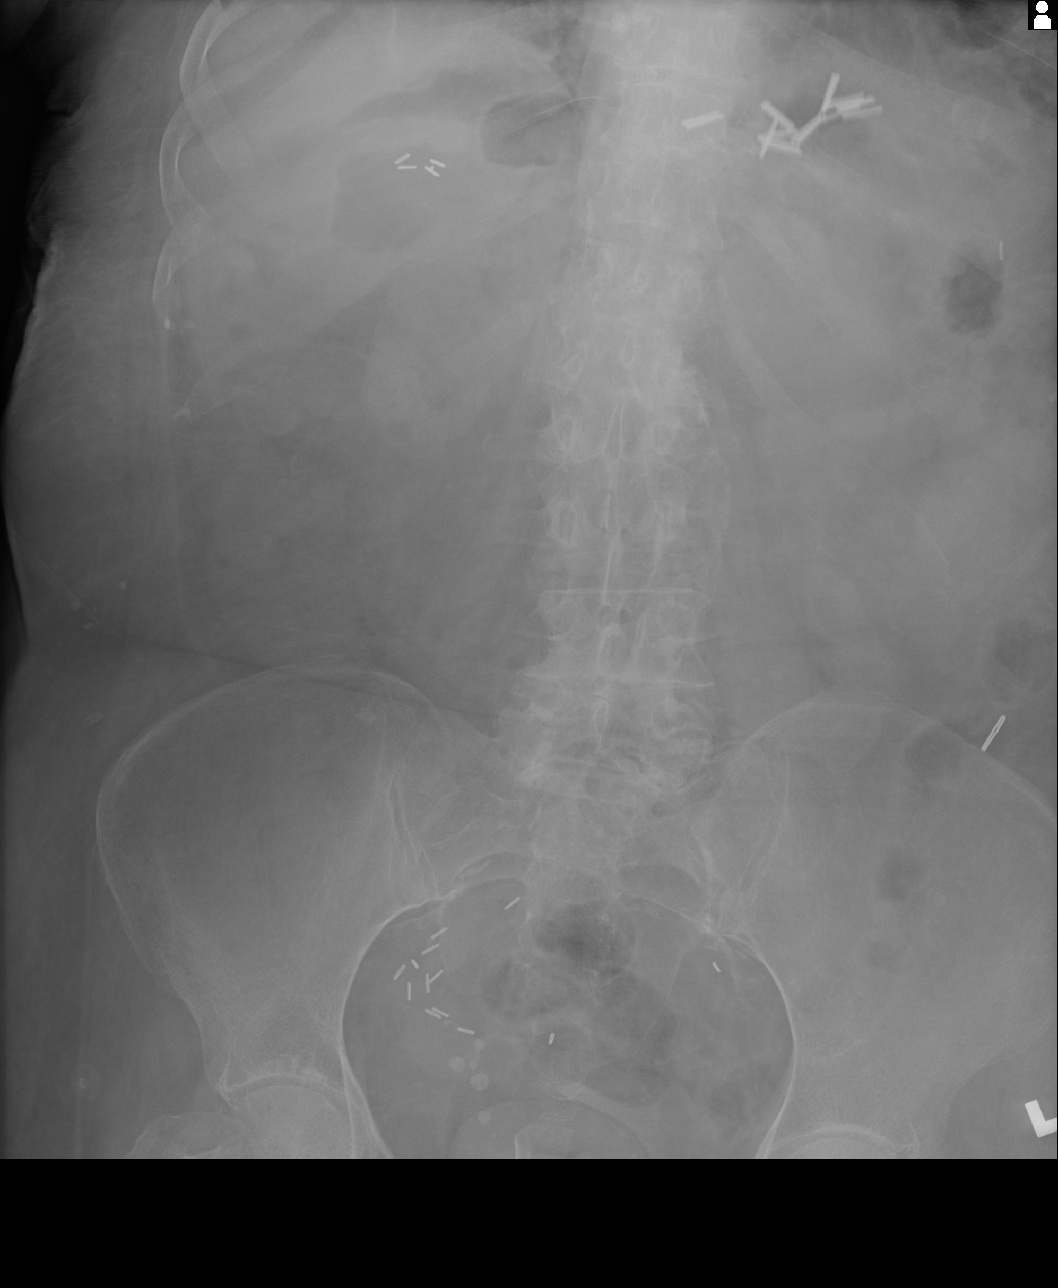
[im 2/2]
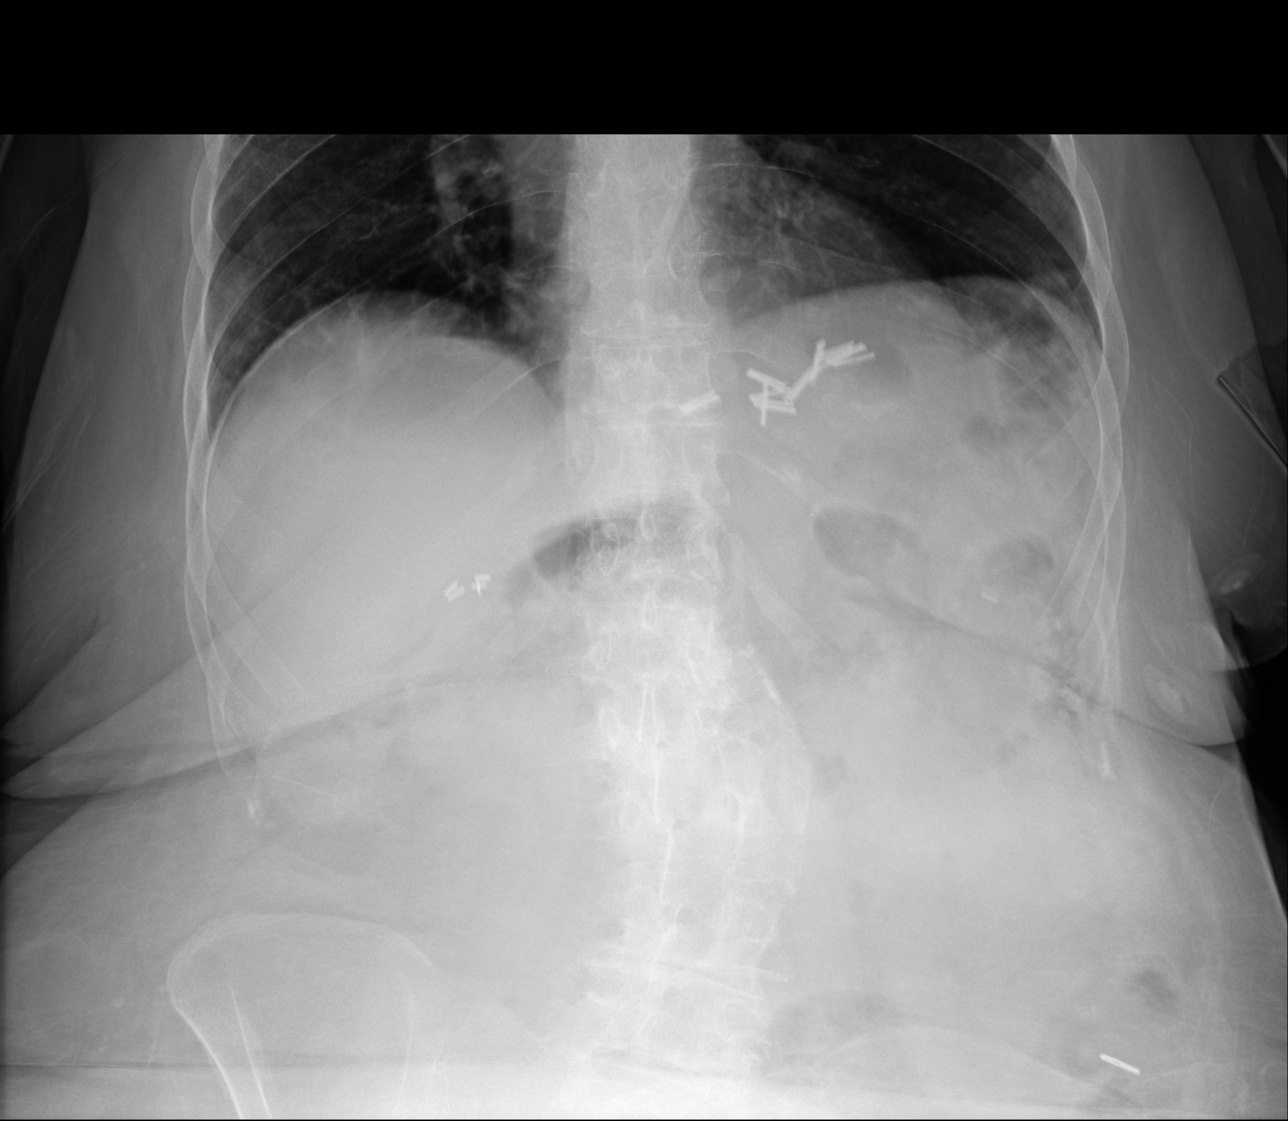

[3 of 3 positions shown; findings below may reference images not displayed]

FINDINGS: There is no evidence of dilated bowel loops or free intraperitoneal
air. Status post cholecystectomy. Surgical clips are noted in the
left upper quadrant pelvis is well. Phleboliths are noted in the
pelvis. Heart size and mediastinal contours are within normal
limits. Both lungs are clear.
IMPRESSION: No evidence of bowel obstruction or ileus. No acute cardiopulmonary
abnormality seen.

## 2016-04-26 IMAGING — CT CT ABD-PELV W/ CM
2 of 5 series · 15 of 46 positions shown, 17 images · IV contrast (Omnipaque 300)
Comparison: CT 04/28/2012; 01/21/2011.

CLINICAL DATA: Patient with abdominal pain and diarrhea.

EXAM:
CT ABDOMEN AND PELVIS WITH CONTRAST
TECHNIQUE: Multidetector CT imaging of the abdomen and pelvis was performed
using the standard protocol following bolus administration of
intravenous contrast.
CONTRAST:  100mL OMNIPAQUE IOHEXOL 300 MG/ML  SOLN

[Series 2: abd_pel_with 5.0 b40f · axial · 0.78mm/px · z∈[-439,-19]mm · 12 of 96 slices shown, 14 images]
[im 6/96  soft-tissue]
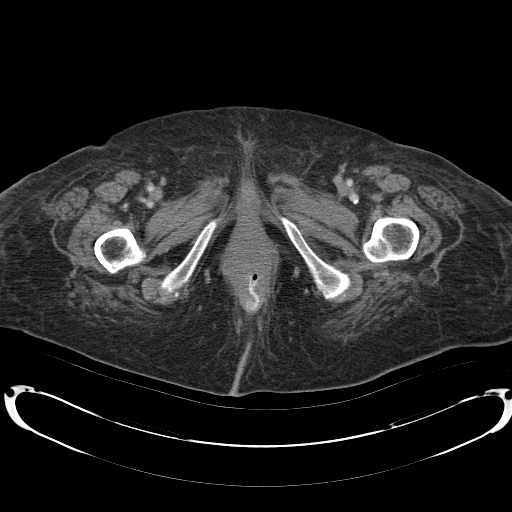
[im 6/96  bone]
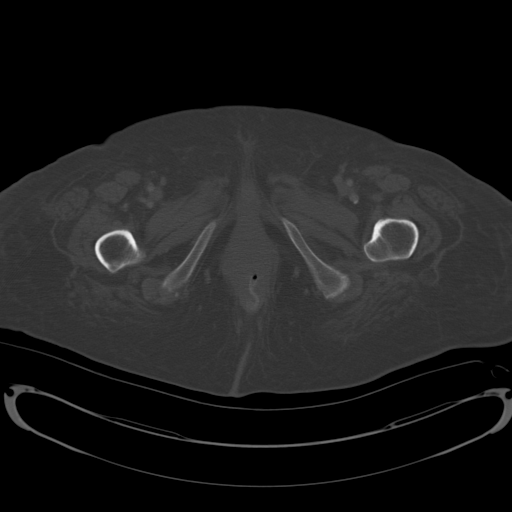
[im 16/96  soft-tissue]
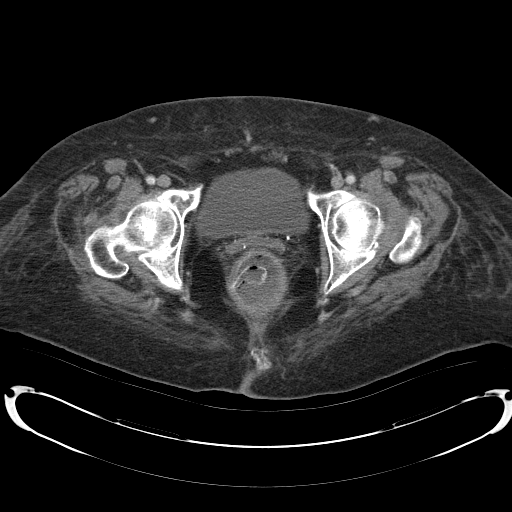
[im 22/96  soft-tissue]
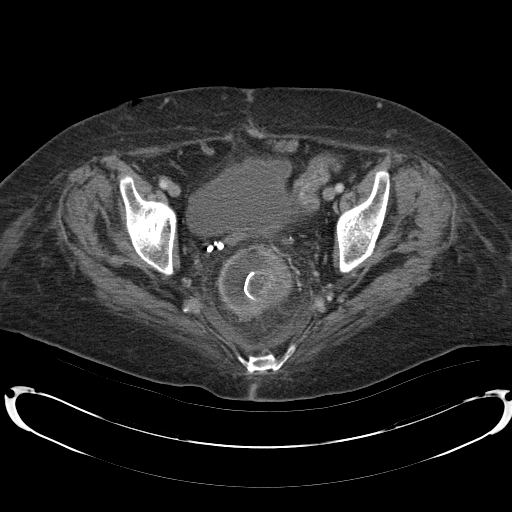
[im 27/96  soft-tissue]
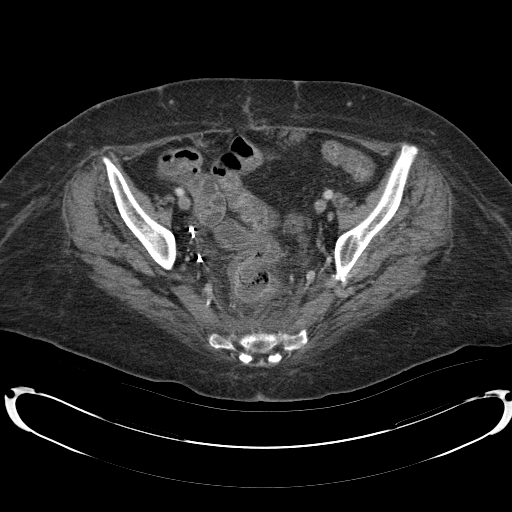
[im 37/96  soft-tissue]
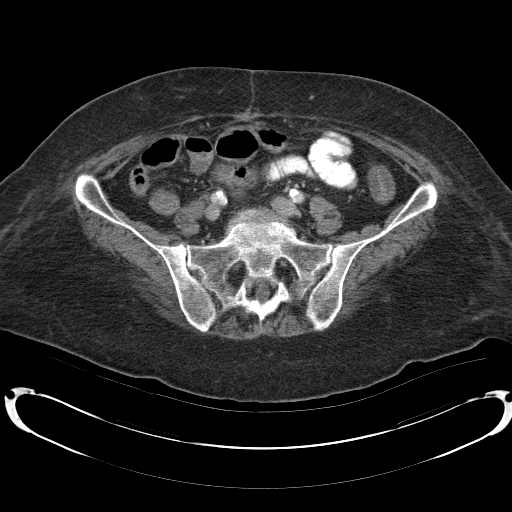
[im 43/96  soft-tissue]
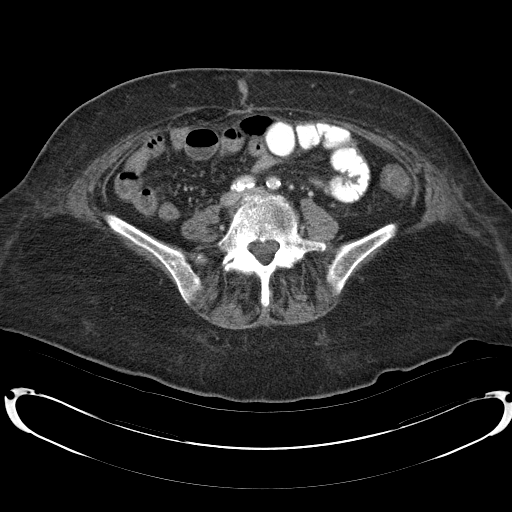
[im 53/96  soft-tissue]
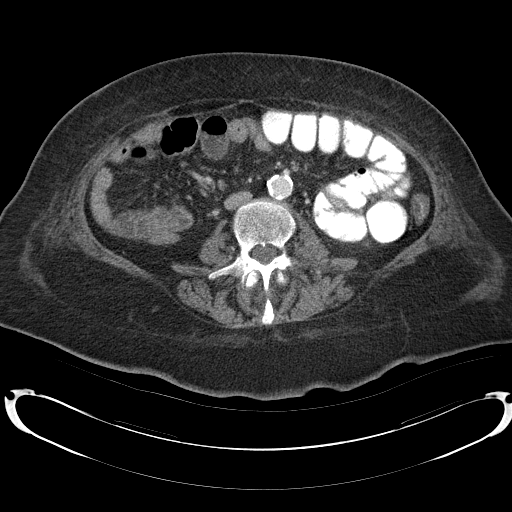
[im 59/96  soft-tissue]
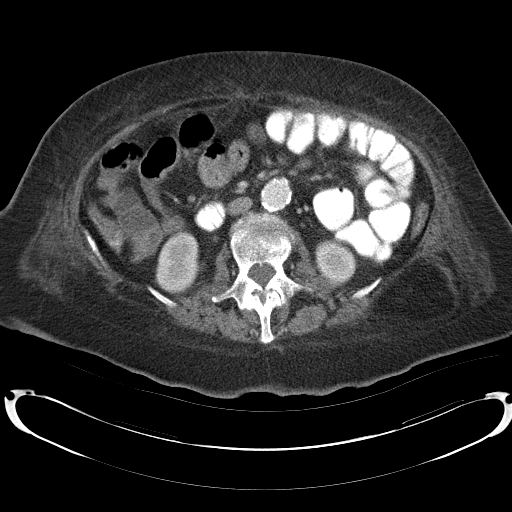
[im 69/96  soft-tissue]
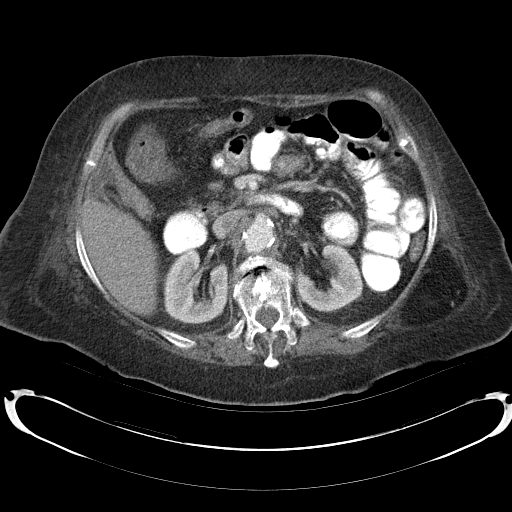
[im 69/96  bone]
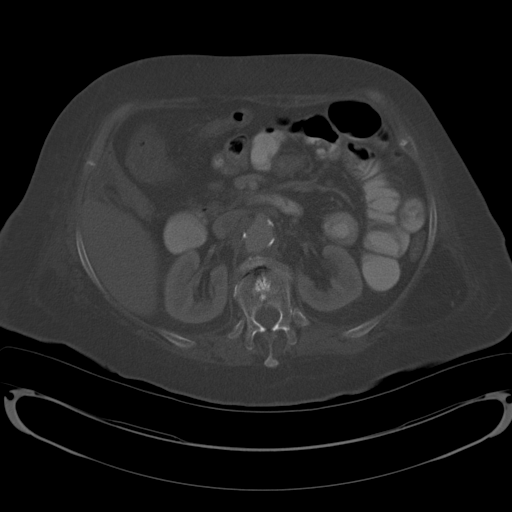
[im 74/96  soft-tissue]
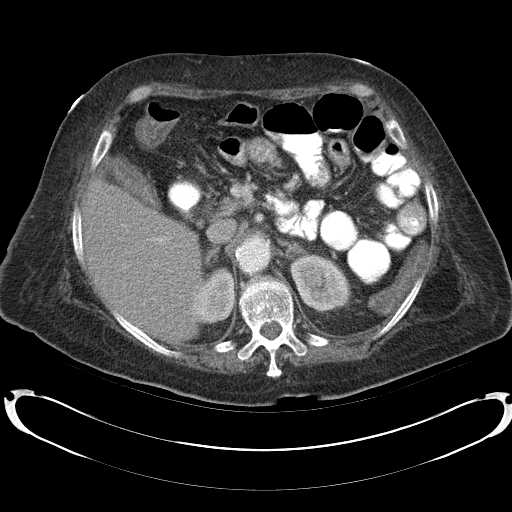
[im 80/96  soft-tissue]
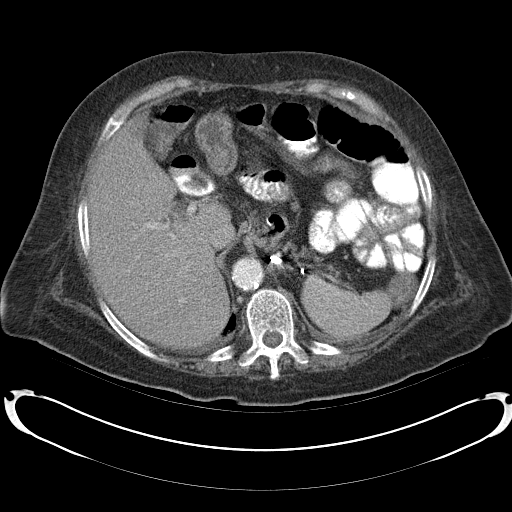
[im 90/96  soft-tissue]
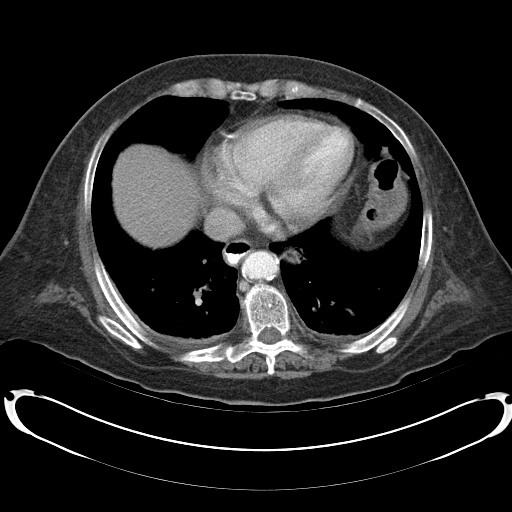

[Series 5: abd_pel_with 3.0 spo cor · coronal · 0.77mm/px · 3 of 84 slices shown]
[im 28/84  soft-tissue]
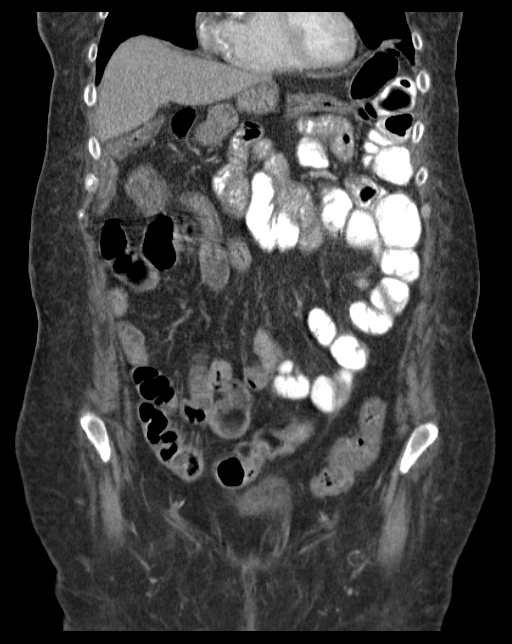
[im 37/84  soft-tissue]
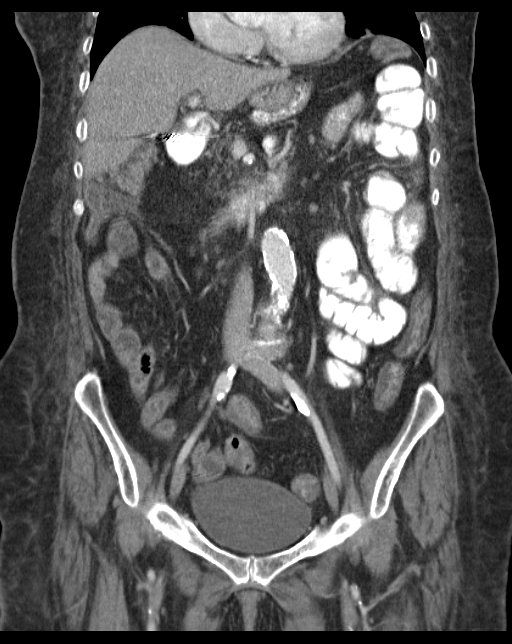
[im 47/84  soft-tissue]
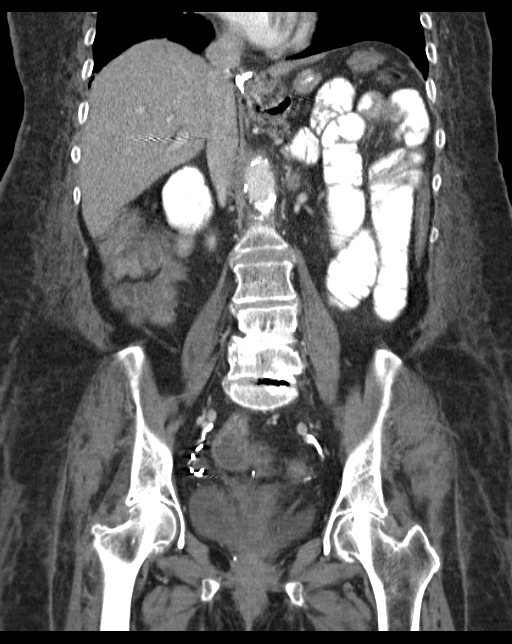

[15 of 46 positions shown; findings below may reference images not displayed]

FINDINGS: Visualization of the lower thorax demonstrates small bilateral
pleural effusions. Consolidative opacity within the dependent right
lower lobe, nonspecific, potentially representing atelectasis.
Infection could have a similar appearance.

The liver is normal in size and contour without focal hepatic lesion
identified. Stable mild intrahepatic and extrahepatic biliary ductal
dilatation. Status post cholecystectomy. Spleen is unremarkable.
Normal bilateral adrenal glands. Kidneys enhance symmetrically with
contrast. No hydronephrosis. Urinary bladder is unremarkable.

Infrarenal abdominal aortic ectasia measuring 2.2 cm. Scattered
calcified atherosclerotic plaque involving the abdominal aorta and
iliac vessels.

Rectal tube is in place. There is perirectal fat stranding and a
small amount of fluid within the pelvis. The colon is decompressed,
limiting evaluation however there is fairly diffuse colonic wall
thickening, mostly involving the descending and sigmoid colon. Oral
contrast material within the small bowel. No evidence for small
bowel obstruction. Mildly prominent small-bowel loops within the
left upper quadrant. No free fluid or free intraperitoneal air.
Stable postsurgical changes involving the stomach.

No aggressive or acute appearing osseous lesions. Lower lumbar spine
degenerative change. Left lateral chest wall lipoma.
IMPRESSION: Diffuse wall thickening of the colon, most pronounced involving the
descending and sigmoid colon, compatible with colitis.
Considerations include infectious, inflammatory or ischemic
etiologies. Consider correlation with colonoscopy after resolution
of the acute symptomatology to exclude underlying mass.

Prominence of the small bowel loops within the left upper quadrant
may represent associated ileus.

Small bilateral pleural effusions. Consolidative opacity within the
right lower lobe may represent atelectasis. Infection is not
excluded. Recommend radiographic followup to ensure resolution.

Unchanged mild intrahepatic and extrahepatic biliary ductal
dilatation status post cholecystectomy.

These results will be called to the ordering clinician or
representative by the Radiologist Assistant, and communication
documented in the PACS or zVision Dashboard.

## 2016-05-03 ENCOUNTER — Ambulatory Visit (INDEPENDENT_AMBULATORY_CARE_PROVIDER_SITE_OTHER): Payer: Medicare Other | Admitting: Family Medicine

## 2016-05-03 ENCOUNTER — Encounter: Payer: Self-pay | Admitting: Family Medicine

## 2016-05-03 VITALS — BP 130/82 | Ht 62.0 in | Wt 191.8 lb

## 2016-05-03 DIAGNOSIS — G8929 Other chronic pain: Secondary | ICD-10-CM | POA: Diagnosis not present

## 2016-05-03 DIAGNOSIS — I10 Essential (primary) hypertension: Secondary | ICD-10-CM | POA: Diagnosis not present

## 2016-05-03 DIAGNOSIS — M7542 Impingement syndrome of left shoulder: Secondary | ICD-10-CM

## 2016-05-03 DIAGNOSIS — M25561 Pain in right knee: Secondary | ICD-10-CM

## 2016-05-03 DIAGNOSIS — E7800 Pure hypercholesterolemia, unspecified: Secondary | ICD-10-CM

## 2016-05-03 DIAGNOSIS — I6350 Cerebral infarction due to unspecified occlusion or stenosis of unspecified cerebral artery: Secondary | ICD-10-CM | POA: Diagnosis not present

## 2016-05-03 MED ORDER — HYDROCODONE-ACETAMINOPHEN 5-325 MG PO TABS: ORAL_TABLET | ORAL | 0 refills | Status: DC

## 2016-05-03 MED ORDER — ALPRAZOLAM 1 MG PO TABS: ORAL_TABLET | ORAL | 5 refills | Status: DC

## 2016-05-03 NOTE — Progress Notes (Signed)
   Subjective:    Patient ID: [name], female    DOB: 1941-05-09, 75 y.o.   MRN: [id number]  HPI This patient was seen today for chronic pain  The medication list was reviewed and updated.   -Compliance with medication: yes  - Number patient states they take daily: 4  -when was the last dose patient took? today  The patient was advised the importance of maintaining medication and not using illegal substances with these.  Refills needed: yes  The patient was educated that we can provide 3 monthly scripts for their medication, it is their responsibility to follow the instructions.  Side effects or complications from medications: none  Patient is aware that pain medications are meant to minimize the severity of the pain to allow their pain levels to improve to allow for better function. They are aware of that pain medications cannot totally remove their pain.  Due for UDT ( at least once per year) :   Patient had to increase xanax from 1/2 tab during the day to a whole pill during the day   Blood pressure medicine and blood pressure levels reviewed today with patient. Compliant with blood pressure medicine. States does not miss a dose. No obvious side effects. Blood pressure generally good when checked elsewhere. Watching salt intake.  Patient continues to take lipid medication regularly. No obvious side effects from it. Generally does not miss a dose. Prior blood work results are reviewed with patient. Patient continues to work on fat intake in diet    Review of Systems No headache, no major weight loss or weight gain, no chest pain no back pain abdominal pain no change in bowel habits complete ROS otherwise negative      Objective:   Physical Exam   Alert vitals stable, NAD. Blood pressure good on repeat. HEENT normal. Lungs clear. Heart regular rate and rhythm. Right knee crepitations present moderate swelling positive pain with extension and flexion positive low  back pain     Assessment & Plan:  Impression 1 chronic pain discussed definitely need for medication 2 chronic anxiety encouraged to back down to prior dose of Xanax rationale discussed #3 hypertension good control discussed maintain same #4 hyperlipidemia good control discussed maintain same

## 2016-05-22 ENCOUNTER — Other Ambulatory Visit: Payer: Self-pay | Admitting: Family Medicine

## 2016-05-27 ENCOUNTER — Other Ambulatory Visit: Payer: Self-pay | Admitting: Family Medicine

## 2016-06-05 DIAGNOSIS — R69 Illness, unspecified: Secondary | ICD-10-CM | POA: Diagnosis not present

## 2016-06-08 ENCOUNTER — Other Ambulatory Visit: Payer: Self-pay | Admitting: Family Medicine

## 2016-06-13 ENCOUNTER — Other Ambulatory Visit: Payer: Self-pay | Admitting: Family Medicine

## 2016-06-17 ENCOUNTER — Other Ambulatory Visit: Payer: Self-pay | Admitting: Family Medicine

## 2016-07-10 ENCOUNTER — Emergency Department (HOSPITAL_COMMUNITY): Payer: Medicare HMO

## 2016-07-10 ENCOUNTER — Emergency Department (HOSPITAL_COMMUNITY)
Admission: EM | Admit: 2016-07-10 | Discharge: 2016-07-10 | Disposition: A | Discharge status: Home / Self Care | Payer: Medicare HMO | Attending: Emergency Medicine | Admitting: Emergency Medicine

## 2016-07-10 ENCOUNTER — Encounter (HOSPITAL_COMMUNITY): Payer: Self-pay | Admitting: *Deleted

## 2016-07-10 DIAGNOSIS — J0100 Acute maxillary sinusitis, unspecified: Secondary | ICD-10-CM | POA: Insufficient documentation

## 2016-07-10 DIAGNOSIS — F17210 Nicotine dependence, cigarettes, uncomplicated: Secondary | ICD-10-CM | POA: Insufficient documentation

## 2016-07-10 DIAGNOSIS — J449 Chronic obstructive pulmonary disease, unspecified: Secondary | ICD-10-CM | POA: Diagnosis not present

## 2016-07-10 DIAGNOSIS — Z79899 Other long term (current) drug therapy: Secondary | ICD-10-CM | POA: Insufficient documentation

## 2016-07-10 DIAGNOSIS — I10 Essential (primary) hypertension: Secondary | ICD-10-CM | POA: Insufficient documentation

## 2016-07-10 DIAGNOSIS — R69 Illness, unspecified: Secondary | ICD-10-CM | POA: Diagnosis not present

## 2016-07-10 DIAGNOSIS — E039 Hypothyroidism, unspecified: Secondary | ICD-10-CM | POA: Diagnosis not present

## 2016-07-10 DIAGNOSIS — R4182 Altered mental status, unspecified: Secondary | ICD-10-CM | POA: Diagnosis not present

## 2016-07-10 LAB — URINALYSIS, ROUTINE W REFLEX MICROSCOPIC
BILIRUBIN URINE: NEGATIVE
Glucose, UA: NEGATIVE mg/dL
HGB URINE DIPSTICK: NEGATIVE
KETONES UR: NEGATIVE mg/dL
Leukocytes, UA: NEGATIVE
Nitrite: NEGATIVE
Protein, ur: NEGATIVE mg/dL
SPECIFIC GRAVITY, URINE: 1.008 (ref 1.005–1.030)
pH: 6 (ref 5.0–8.0)

## 2016-07-10 LAB — COMPREHENSIVE METABOLIC PANEL
ALT: 11 U/L — AB (ref 14–54)
ANION GAP: 8 (ref 5–15)
AST: 21 U/L (ref 15–41)
Albumin: 3.6 g/dL (ref 3.5–5.0)
Alkaline Phosphatase: 83 U/L (ref 38–126)
BUN: 11 mg/dL (ref 6–20)
CO2: 28 mmol/L (ref 22–32)
Calcium: 9.3 mg/dL (ref 8.9–10.3)
Chloride: 103 mmol/L (ref 101–111)
Creatinine, Ser: 0.77 mg/dL (ref 0.44–1.00)
GFR calc non Af Amer: >60 mL/min (ref >60)
Glucose, Bld: 92 mg/dL (ref 65–99)
Potassium: 4.8 mmol/L (ref 3.5–5.1)
SODIUM: 139 mmol/L (ref 135–145)
Total Bilirubin: 0.4 mg/dL (ref 0.3–1.2)
Total Protein: 7.1 g/dL (ref 6.5–8.1)

## 2016-07-10 LAB — CBC WITH DIFFERENTIAL/PLATELET
Basophils Absolute: 0.1 10*3/uL (ref 0.0–0.1)
Basophils Relative: 1 %
EOS ABS: 0.2 10*3/uL (ref 0.0–0.7)
EOS PCT: 2 %
HCT: 31.5 % — ABNORMAL LOW (ref 36.0–46.0)
Hemoglobin: 9.7 g/dL — ABNORMAL LOW (ref 12.0–15.0)
LYMPHS ABS: 2.9 10*3/uL (ref 0.7–4.0)
Lymphocytes Relative: 28 %
MCH: 24 pg — AB (ref 26.0–34.0)
MCHC: 30.8 g/dL (ref 30.0–36.0)
MCV: 77.8 fL — ABNORMAL LOW (ref 78.0–100.0)
Monocytes Absolute: 0.6 10*3/uL (ref 0.1–1.0)
Monocytes Relative: 6 %
Neutro Abs: 6.5 10*3/uL (ref 1.7–7.7)
Neutrophils Relative %: 63 %
Platelets: 487 10*3/uL — ABNORMAL HIGH (ref 150–400)
RBC: 4.05 MIL/uL (ref 3.87–5.11)
RDW: 18 % — ABNORMAL HIGH (ref 11.5–15.5)
WBC: 10.3 10*3/uL (ref 4.0–10.5)

## 2016-07-10 MED ORDER — AMOXICILLIN 500 MG PO CAPS: 500.0000 mg | ORAL_CAPSULE | Freq: Three times a day (TID) | ORAL | 0 refills | Status: DC

## 2016-07-10 MED ORDER — SODIUM CHLORIDE 0.9 % IV BOLUS (SEPSIS): 1000.0000 mL | Freq: Once | INTRAVENOUS | Status: DC

## 2016-07-10 NOTE — ED Triage Notes (Signed)
Pt comes in with family for lower front dental pain. Pt has tooth decay and receeding gums. Pt is talking out of her head saying things like, "he has too many kids." Family is with patient and states patient is normally altered  (this started after stroke) but it is worse today. Pt tearful in triage.

## 2016-07-10 NOTE — ED Provider Notes (Signed)
[facility] DEPT Provider Note   CSN: [id number] Arrival date & time: 07/10/16  1215     History   Chief Complaint Chief Complaint  Patient presents with  . Dental Pain  . Altered Mental Status    HPI [name] is a 76 y.o. female.  Patient according to son little more confused than normal. She does have confusion from previous strokes usually. Also she's been having problems with her teeth and has been seeing a dentist.   The history is provided by the patient. No language interpreter was used.  Altered Mental Status   This is a recurrent problem. The current episode started 12 to 24 hours ago. The problem has not changed since onset.Associated symptoms include confusion. Risk factors include a recent infection. Her past medical history does not include seizures.    Past Medical History:  Diagnosis Date  . Anxiety   . Aphasia due to stroke ([place])   . Apraxia due to stroke ([place])   . COPD (chronic obstructive pulmonary disease) (HCC)    MIld  . Fibromyalgia   . Gastritis   . GERD (gastroesophageal reflux disease)   . Headache(784.0)   . Hyperlipidemia   . Hypertension   . Hypothyroidism   . Lumbar spondylosis 01/23/2011  . Osteoporosis   . Stroke ([place])   . Tobacco abuse     Patient Active Problem List   Diagnosis Date Noted  . Seborrheic keratoses 01/14/2016  . Urge incontinence of urine 03/15/2015  . Colitis   . Diarrhea 04/26/2014  . Hypotension 04/26/2014  . AKI (acute kidney injury) ([place]) 04/26/2014  . SIRS (systemic inflammatory response syndrome) ([place]) 04/26/2014  . Anemia of chronic disease   . Dehydration   . Arterial hypotension   . Anemia 03/30/2014  . Chest pain 03/29/2014  . Aphasia due to stroke ([place]) 03/29/2014  . Apraxia due to stroke ([place]) 03/29/2014  . Tobacco abuse 03/29/2014  . Depression 03/29/2014  . Hypothyroidism 04/19/2013  . Cerebral artery occlusion with cerebral infarction ([place]) 09/02/2012  . Headache(784.0)  09/02/2012  . Other and unspecified hyperlipidemia 08/13/2012  . Aphasia due to recent cerebral infarction 01/03/2012  . Lumbar spondylosis 01/23/2011  . Hypokalemia 01/22/2011  . Abdominal lipoma 01/22/2011  . Bronchitis 01/22/2011  . HTN (hypertension), malignant 01/21/2011  . Sinusitis 01/21/2011  . Tinnitus 01/21/2011  . Delirium 01/20/2011  . UTI (urinary tract infection) 01/20/2011  . Anxiety 01/20/2011  . Chronic abdominal pain 01/20/2011  . Hypothyroid 01/20/2011  . Fibromyalgia 01/20/2011  . GERD (gastroesophageal reflux disease) 01/20/2011    Past Surgical History:  Procedure Laterality Date  . CHOLECYSTECTOMY    . COLONOSCOPY WITH ESOPHAGOGASTRODUODENOSCOPY (EGD)  2006   Dr. Laural Golden: normal upper endoscopy with surgically alterated stomach, normal colon but redundant  . ESOPHAGOGASTRODUODENOSCOPY  04/06/2014   Dr.Rourk: abnormal esophagus  . GASTRIC BYPASS     open  . KNEE SURGERY Left     OB History    Gravida Para Term Preterm AB Living   2 2 2     2    SAB TAB Ectopic Multiple Live Births                   Home Medications    Prior to Admission medications   Medication Sig Start Date End Date Taking? Authorizing Provider  acetaminophen (TYLENOL) 325 MG tablet Take 2 tablets (650 mg total) by mouth every 6 (six) hours as needed for mild pain (or Fever >/= 101). 04/30/14  Yes Velvet Bathe, MD  alendronate (FOSAMAX) 70 MG tablet TAKE 1 TABLET BY MOUTH ONCE A WEEK AS DIRECTED 04/24/16  Yes Mikey Kirschner, MD  ALPRAZolam Duanne Moron) 1 MG tablet Take 1 tablet at bedtime and 1/2 tablet during the day as needed. 05/03/16  Yes Mikey Kirschner, MD  HYDROcodone-acetaminophen (NORCO/VICODIN) 5-325 MG tablet 1 tablet up to QID prn pain 05/03/16  Yes Mikey Kirschner, MD  hydrOXYzine (ATARAX/VISTARIL) 25 MG tablet TAKE 1 TABLET BY MOUTH EVERY 6 HOURS AS NEEDED FOR ITCHING 03/02/16  Yes Mikey Kirschner, MD  levothyroxine (SYNTHROID, LEVOTHROID) 112 MCG tablet TAKE 1  TABLET BY MOUTH EVERY DAY 06/13/16  Yes Mikey Kirschner, MD  lisinopril (PRINIVIL,ZESTRIL) 10 MG tablet Take 0.5 tablets (5 mg total) by mouth daily. Patient taking differently: Take 10 mg by mouth daily.  08/02/15  Yes Mikey Kirschner, MD  Misc Natural Products (OSTEO BI-FLEX ADV DOUBLE ST PO) Take 1 tablet by mouth daily.   Yes Historical Provider, MD  Multiple Vitamin (MULTIVITAMIN WITH MINERALS) TABS Take 1 tablet by mouth daily.   Yes Historical Provider, MD  nortriptyline (PAMELOR) 50 MG capsule TAKE ONE CAPSULE AT BEDTIME 06/08/16  Yes Nilda Simmer, NP  ofloxacin (OCUFLOX) 0.3 % ophthalmic solution INSTILL 5 DROPS IN AFFECTED EAR(S) TWICE DAILY FOR SEVEN DAYS 05/28/16  Yes Mikey Kirschner, MD  pantoprazole (PROTONIX) 40 MG tablet TAKE 1 TABLET BY MOUTH EVERY DAY **STOP RANITIDINE 06/18/16  Yes Mikey Kirschner, MD  PARoxetine (PAXIL) 40 MG tablet TAKE 1 TABLET BY MOUTH DAILY AS DIRECTED 05/28/16  Yes Mikey Kirschner, MD  pravastatin (PRAVACHOL) 20 MG tablet TAKE 1 TABLET BY MOUTH EVERY DAY 03/13/16  Yes Mikey Kirschner, MD  amoxicillin (AMOXIL) 500 MG capsule Take 1 capsule (500 mg total) by mouth 3 (three) times daily. 07/10/16   Milton Ferguson, MD  cefpodoxime (VANTIN) 100 MG tablet Take 1 tablet (100 mg total) by mouth 2 (two) times daily. Patient not taking: Reported on 02/02/2016 04/24/15   Forde Dandy, MD    Family History Family History  Problem Relation Age of Onset  . Heart attack Father   . Colon cancer Brother     Social History Social History  Substance Use Topics  . Smoking status: Light Tobacco Smoker    Years: 30.00    Types: Cigarettes  . Smokeless tobacco: Never Used     Comment: Smoking Now Every Once & Awhile Per Patient  . Alcohol use No     Allergies   Levaquin [levofloxacin in d5w] and Macrobid [nitrofurantoin macrocrystal]   Review of Systems Review of Systems  Unable to perform ROS: Mental status change  Psychiatric/Behavioral: Positive for  confusion.     Physical Exam Updated Vital Signs BP 143/65   Pulse 81   Temp 99.4 F (37.4 C) (Oral)   Resp 20   Ht 5\' 2"  (1.575 m)   Wt 191 lb (86.6 kg)   SpO2 96%   BMI 34.93 kg/m   Physical Exam  Constitutional: She appears well-developed.  HENT:  Head: Normocephalic.  Eyes: Conjunctivae and EOM are normal. No scleral icterus.  Neck: Neck supple. No thyromegaly present.  Cardiovascular: Normal rate and regular rhythm.  Exam reveals no gallop and no friction rub.   No murmur heard. Pulmonary/Chest: No stridor. She has no wheezes. She has no rales. She exhibits no tenderness.  Abdominal: She exhibits no distension. There is no tenderness. There is no rebound.  Musculoskeletal: Normal range of motion. She exhibits no edema.  Lymphadenopathy:    She has no cervical adenopathy.  Neurological: She is alert. She exhibits normal muscle tone. Coordination normal.  Patient oriented to person only.  Skin: No rash noted. No erythema.  Psychiatric: She has a normal mood and affect. Her behavior is normal.     ED Treatments / Results  Labs (all labs ordered are listed, but only abnormal results are displayed) Labs Reviewed  URINALYSIS, ROUTINE W REFLEX MICROSCOPIC - Abnormal; Notable for the following:       Result Value   APPearance HAZY (*)    All other components within normal limits  CBC WITH DIFFERENTIAL/PLATELET - Abnormal; Notable for the following:    Hemoglobin 9.7 (*)    HCT 31.5 (*)    MCV 77.8 (*)    MCH 24.0 (*)    RDW 18.0 (*)    Platelets 487 (*)    All other components within normal limits  COMPREHENSIVE METABOLIC PANEL - Abnormal; Notable for the following:    ALT 11 (*)    All other components within normal limits    EKG  EKG Interpretation None       Radiology Ct Head Wo Contrast  Result Date: 07/10/2016 CLINICAL DATA:  Altered mental status EXAM: CT HEAD WITHOUT CONTRAST TECHNIQUE: Contiguous axial images were obtained from the base of the  skull through the vertex without intravenous contrast. COMPARISON:  April 24, 2015 FINDINGS: Brain: There is mild diffuse atrophy, stable. There is no appreciable intracranial mass, hemorrhage, extra-axial fluid collection, or midline shift. There is evidence of a prior infarct at the left temporal -occipital -parietal junction, stable. There is evidence of a prior small infarct at the gray- white junction of the superior right frontal-temporal junction. There is periventricular small vessel disease throughout the centra semiovale bilaterally. No new gray-white compartment lesions are identified. No evident acute infarct. Vascular: There is no hyperdense vessel. There is calcification in the distal vertebral arteries as well as throughout the carotid siphon regions. Calcification is also noted in the proximal left middle cerebral artery. Skull: Bony calvarium appears intact. Sinuses/Orbits: There is mucosal thickening involving multiple ethmoid air cells bilaterally. Other visualized paranasal sinuses are clear. Visualized orbits appear symmetric bilaterally. Other: Mastoid air cells are clear. IMPRESSION: Mild atrophy with prior infarcts and supratentorial small vessel disease as summarized above, stable. There is no appreciable acute infarct. No hemorrhage or mass effect. There are foci of arterial vascular calcification. There is ethmoid sinus mucosal thickening bilaterally at multiple sites. Electronically Signed   By: Lowella Grip III M.D.   On: 07/10/2016 16:31    Procedures Procedures (including critical care time)  Medications Ordered in ED Medications  sodium chloride 0.9 % bolus 1,000 mL (not administered)     Initial Impression / Assessment and Plan / ED Course  I have reviewed the triage vital signs and the nursing notes.  Pertinent labs & imaging results that were available during my care of the patient were reviewed by me and considered in my medical decision making (see chart  for details).     Labs and CT scan unremarkable except for sinusitis. Patient will be put on amoxicillin and will follow-up with her family doctor this week Final Clinical Impressions(s) / ED Diagnoses   Final diagnoses:  Acute maxillary sinusitis, recurrence not specified    New Prescriptions New Prescriptions   AMOXICILLIN (AMOXIL) 500 MG CAPSULE    Take 1 capsule (500 mg  total) by mouth 3 (three) times daily.     Milton Ferguson, MD 07/10/16 [phone]

## 2016-07-10 NOTE — Discharge Instructions (Signed)
Follow-up with your family doctor the end of this week began next week for recheck

## 2016-07-10 NOTE — ED Notes (Signed)
PT tolerating po fluids at this time and assisted to the bathroom for urination at this time.

## 2016-07-24 ENCOUNTER — Other Ambulatory Visit: Payer: Self-pay | Admitting: Family Medicine

## 2016-07-31 ENCOUNTER — Telehealth: Payer: Self-pay | Admitting: Family Medicine

## 2016-07-31 MED ORDER — HYDROCODONE-ACETAMINOPHEN 5-325 MG PO TABS: ORAL_TABLET | ORAL | 0 refills | Status: DC

## 2016-07-31 NOTE — Telephone Encounter (Signed)
Patients spouse called to cancel the appointment she had scheduled for tomorrow with Dr. Richardson Landry for a med check due to the weather.  She is going to run out of her pain medication in a few days and spouse is requesting Rx.  She has a follow up appointment on 08/07/16.

## 2016-07-31 NOTE — Telephone Encounter (Signed)
Prescription upfront for pick up. Patient notified. 

## 2016-07-31 NOTE — Telephone Encounter (Signed)
One mo worth 

## 2016-08-01 ENCOUNTER — Ambulatory Visit: Payer: Medicare Other | Admitting: Family Medicine

## 2016-08-07 ENCOUNTER — Ambulatory Visit (INDEPENDENT_AMBULATORY_CARE_PROVIDER_SITE_OTHER): Payer: Medicare HMO | Admitting: Family Medicine

## 2016-08-07 ENCOUNTER — Encounter: Payer: Self-pay | Admitting: Family Medicine

## 2016-08-07 VITALS — BP 136/82 | Ht 62.0 in | Wt 187.0 lb

## 2016-08-07 DIAGNOSIS — E785 Hyperlipidemia, unspecified: Secondary | ICD-10-CM

## 2016-08-07 DIAGNOSIS — G8929 Other chronic pain: Secondary | ICD-10-CM | POA: Diagnosis not present

## 2016-08-07 DIAGNOSIS — R7301 Impaired fasting glucose: Secondary | ICD-10-CM | POA: Diagnosis not present

## 2016-08-07 DIAGNOSIS — I69320 Aphasia following cerebral infarction: Secondary | ICD-10-CM

## 2016-08-07 DIAGNOSIS — I10 Essential (primary) hypertension: Secondary | ICD-10-CM | POA: Diagnosis not present

## 2016-08-07 DIAGNOSIS — M25561 Pain in right knee: Secondary | ICD-10-CM

## 2016-08-07 MED ORDER — HYDROCODONE-ACETAMINOPHEN 5-325 MG PO TABS: ORAL_TABLET | ORAL | 0 refills | Status: DC

## 2016-08-07 MED ORDER — LISINOPRIL 10 MG PO TABS: 5.0000 mg | ORAL_TABLET | Freq: Every day | ORAL | 5 refills | Status: DC

## 2016-08-07 NOTE — Progress Notes (Signed)
   Subjective:    Patient ID: [name], female    DOB: 22-Sep-1940, 76 y.o.   MRN: [id number]  HPI This patient was seen today for chronic pain  The medication list was reviewed and updated.   -Compliance with medication: Takes daily  - Number patient states they take daily: Patient takes 4 per day.   -when was the last dose patient took? This morning.  The patient was advised the importance of maintaining medication and not using illegal substances with these.  Refills needed: Yes   The patient was educated that we can provide 3 monthly scripts for their medication, it is their responsibility to follow the instructions.  Side effects or complications from medications: None   Patient is aware that pain medications are meant to minimize the severity of the pain to allow their pain levels to improve to allow for better function. They are aware of that pain medications cannot totally remove their pain.  Due for UDT ( at least once per year) : n/a  Patient compliant with pain medication. Continues to experience the pain which led to initiation of analgesic intervention. No significant negative side effects. States definitely needs the pain medication to maintain current level of functioning. Does not receive controlled substance pain medication elsewhere.  Patient continues to take lipid medication regularly. No obvious side effects from it. Generally does not miss a dose. Prior blood work results are reviewed with patient. Patient continues to work on fat intake in diet  Blood pressure medicine and blood pressure levels reviewed today with patient. Compliant with blood pressure medicine. States does not miss a dose. No obvious side effects. Blood pressure generally good when checked elsewhere. Watching salt intake.   Patient has history of stroke. Continues to ambulate in the house regularly. No new symptoms of stroke thankfully.  Patient notes ongoing compliance with  antidepressant medication. No obvious side effects. Reports does not miss a dose. Overall continues to help depression substantially. No thoughts of homicide or suicide. Would like to maintain medication.  States no other concerns this visit.      Review of Systems No headache, no major weight loss or weight gain, no chest pain no back pain abdominal pain no change in bowel habits complete ROS otherwise negative     Objective:   Physical Exam Alert and oriented, vitals reviewed and stable, NAD ENT-TM's and ext canals WNL bilat via otoscopic exam Soft palate, tonsils and post pharynx WNL via oropharyngeal exam Neck-symmetric, no masses; thyroid nonpalpable and nontender Pulmonary-no tachypnea or accessory muscle use; Clear without wheezes via auscultation Card--no abnrml murmurs, rhythm reg and rate WNL Carotid pulses symmetric, without bruits Neurological exam reveals baseline dysphasia       Assessment & Plan:  Impression 1 status post stroke discussed #2 chronic pain with ongoing need for medicine proper use discussed #3 hypertension good control discussed maintain same #4 prior blood work reviewed. Good control in the past. To maintain same pending blood work. Diet exercise discussed. Follow-up as scheduled several months. WSL

## 2016-08-22 ENCOUNTER — Telehealth: Payer: Self-pay | Admitting: Family Medicine

## 2016-08-23 NOTE — Telephone Encounter (Signed)
error 

## 2016-08-24 ENCOUNTER — Emergency Department (HOSPITAL_COMMUNITY)
Admission: EM | Admit: 2016-08-24 | Discharge: 2016-08-24 | Disposition: A | Discharge status: Home / Self Care | Payer: Medicare HMO | Attending: Emergency Medicine | Admitting: Emergency Medicine

## 2016-08-24 ENCOUNTER — Encounter (HOSPITAL_COMMUNITY): Payer: Self-pay | Admitting: Emergency Medicine

## 2016-08-24 DIAGNOSIS — Z79899 Other long term (current) drug therapy: Secondary | ICD-10-CM | POA: Diagnosis not present

## 2016-08-24 DIAGNOSIS — R4701 Aphasia: Secondary | ICD-10-CM | POA: Diagnosis present

## 2016-08-24 DIAGNOSIS — J449 Chronic obstructive pulmonary disease, unspecified: Secondary | ICD-10-CM | POA: Insufficient documentation

## 2016-08-24 DIAGNOSIS — I10 Essential (primary) hypertension: Secondary | ICD-10-CM | POA: Insufficient documentation

## 2016-08-24 DIAGNOSIS — E039 Hypothyroidism, unspecified: Secondary | ICD-10-CM | POA: Diagnosis not present

## 2016-08-24 DIAGNOSIS — F17210 Nicotine dependence, cigarettes, uncomplicated: Secondary | ICD-10-CM | POA: Insufficient documentation

## 2016-08-24 DIAGNOSIS — K047 Periapical abscess without sinus: Secondary | ICD-10-CM | POA: Insufficient documentation

## 2016-08-24 DIAGNOSIS — R69 Illness, unspecified: Secondary | ICD-10-CM | POA: Diagnosis not present

## 2016-08-24 MED ORDER — AMOXICILLIN 500 MG PO CAPS: 500.0000 mg | ORAL_CAPSULE | Freq: Three times a day (TID) | ORAL | 0 refills | Status: AC

## 2016-08-24 MED ORDER — AMOXICILLIN 250 MG PO CAPS
500.0000 mg | ORAL_CAPSULE | Freq: Once | ORAL | Status: AC
  Administered 2016-08-24: 500 mg via ORAL
  Filled 2016-08-24: qty 2

## 2016-08-24 NOTE — ED Triage Notes (Signed)
Pt has a dentist Dr Ulla Potash, but he is not in office today-  She has been here several times for her bond is coming loose and leaving places on her gums

## 2016-08-24 NOTE — ED Provider Notes (Signed)
[facility] DEPT Provider Note   CSN: [id number] Arrival date & time: 08/24/16  7106     History   Chief Complaint Chief Complaint  Patient presents with  . Dental Problem    bond coming loose on her dentures    HPI [name] is a 76 y.o. female with a history of cva which has left her with difficult speech (expressive aphasia) presenting with a mouth complaint.  Her sons bring her in as they have unable to determine the exact problem but she has been complaining of "ball" coming off of her gumline beneath her lower anterior teeth. Her dentist is Dr. Derenda Mis who is not available today. There has been no obvious fevers, no facial swelling and she has been eating without too much difficulty.  The history is provided by the patient and a relative.    Past Medical History:  Diagnosis Date  . Anxiety   . Aphasia due to stroke ([place])   . Apraxia due to stroke ([place])   . COPD (chronic obstructive pulmonary disease) (HCC)    MIld  . Fibromyalgia   . Gastritis   . GERD (gastroesophageal reflux disease)   . Headache(784.0)   . Hyperlipidemia   . Hypertension   . Hypothyroidism   . Lumbar spondylosis 01/23/2011  . Osteoporosis   . Stroke ([place])   . Tobacco abuse     Patient Active Problem List   Diagnosis Date Noted  . Seborrheic keratoses 01/14/2016  . Urge incontinence of urine 03/15/2015  . Colitis   . Diarrhea 04/26/2014  . Hypotension 04/26/2014  . AKI (acute kidney injury) ([place]) 04/26/2014  . SIRS (systemic inflammatory response syndrome) ([place]) 04/26/2014  . Anemia of chronic disease   . Dehydration   . Arterial hypotension   . Anemia 03/30/2014  . Chest pain 03/29/2014  . Aphasia due to stroke ([place]) 03/29/2014  . Apraxia due to stroke ([place]) 03/29/2014  . Tobacco abuse 03/29/2014  . Depression 03/29/2014  . Hypothyroidism 04/19/2013  . Cerebral artery occlusion with cerebral infarction ([place]) 09/02/2012  . Headache(784.0) 09/02/2012  . Other and  unspecified hyperlipidemia 08/13/2012  . Aphasia due to recent cerebral infarction 01/03/2012  . Lumbar spondylosis 01/23/2011  . Hypokalemia 01/22/2011  . Abdominal lipoma 01/22/2011  . Bronchitis 01/22/2011  . HTN (hypertension), malignant 01/21/2011  . Sinusitis 01/21/2011  . Tinnitus 01/21/2011  . Delirium 01/20/2011  . UTI (urinary tract infection) 01/20/2011  . Anxiety 01/20/2011  . Chronic abdominal pain 01/20/2011  . Hypothyroid 01/20/2011  . Fibromyalgia 01/20/2011  . GERD (gastroesophageal reflux disease) 01/20/2011    Past Surgical History:  Procedure Laterality Date  . CHOLECYSTECTOMY    . COLONOSCOPY WITH ESOPHAGOGASTRODUODENOSCOPY (EGD)  2006   Dr. Laural Golden: normal upper endoscopy with surgically alterated stomach, normal colon but redundant  . ESOPHAGOGASTRODUODENOSCOPY  04/06/2014   Dr.Rourk: abnormal esophagus  . GASTRIC BYPASS     open  . KNEE SURGERY Left     OB History    Gravida Para Term Preterm AB Living   2 2 2     2    SAB TAB Ectopic Multiple Live Births                   Home Medications    Prior to Admission medications   Medication Sig Start Date End Date Taking? Authorizing Provider  acetaminophen (TYLENOL) 325 MG tablet Take 2 tablets (650 mg total) by mouth every 6 (six) hours as needed for mild pain (or Fever >/=  101). 04/30/14   Velvet Bathe, MD  alendronate (FOSAMAX) 70 MG tablet TAKE 1 TABLET BY MOUTH ONCE A WEEK AS DIRECTED Patient not taking: Reported on 08/07/2016 07/24/16   Mikey Kirschner, MD  ALPRAZolam Duanne Moron) 1 MG tablet Take 1 tablet at bedtime and 1/2 tablet during the day as needed. 05/03/16   Mikey Kirschner, MD  amoxicillin (AMOXIL) 500 MG capsule Take 1 capsule (500 mg total) by mouth 3 (three) times daily. 08/24/16 09/03/16  Evalee Jefferson, PA-C  HYDROcodone-acetaminophen (NORCO/VICODIN) 5-325 MG tablet 1 tablet up to QID prn pain 08/07/16   Mikey Kirschner, MD  hydrOXYzine (ATARAX/VISTARIL) 25 MG tablet TAKE 1 TABLET BY MOUTH  EVERY 6 HOURS AS NEEDED FOR ITCHING 03/02/16   Mikey Kirschner, MD  levothyroxine (SYNTHROID, LEVOTHROID) 112 MCG tablet TAKE 1 TABLET BY MOUTH EVERY DAY 06/13/16   Mikey Kirschner, MD  lisinopril (PRINIVIL,ZESTRIL) 10 MG tablet Take 0.5 tablets (5 mg total) by mouth daily. 08/07/16   Mikey Kirschner, MD  Misc Natural Products (OSTEO BI-FLEX ADV DOUBLE ST PO) Take 1 tablet by mouth daily.    Historical Provider, MD  Multiple Vitamin (MULTIVITAMIN WITH MINERALS) TABS Take 1 tablet by mouth daily.    Historical Provider, MD  nortriptyline (PAMELOR) 50 MG capsule TAKE ONE CAPSULE AT BEDTIME 06/08/16   Nilda Simmer, NP  ofloxacin (OCUFLOX) 0.3 % ophthalmic solution INSTILL 5 DROPS IN AFFECTED EAR(S) TWICE DAILY FOR SEVEN DAYS 05/28/16   Mikey Kirschner, MD  pantoprazole (PROTONIX) 40 MG tablet TAKE 1 TABLET BY MOUTH EVERY DAY **STOP RANITIDINE 06/18/16   Mikey Kirschner, MD  PARoxetine (PAXIL) 40 MG tablet TAKE 1 TABLET BY MOUTH DAILY AS DIRECTED 05/28/16   Mikey Kirschner, MD  pravastatin (PRAVACHOL) 20 MG tablet TAKE 1 TABLET BY MOUTH EVERY DAY 03/13/16   Mikey Kirschner, MD    Family History Family History  Problem Relation Age of Onset  . Heart attack Father   . Colon cancer Brother     Social History Social History  Substance Use Topics  . Smoking status: Light Tobacco Smoker    Years: 30.00    Types: Cigarettes  . Smokeless tobacco: Never Used     Comment: Smoking Now Every Once & Awhile Per Patient  . Alcohol use No     Allergies   Levaquin [levofloxacin in d5w] and Macrobid [nitrofurantoin macrocrystal]   Review of Systems Review of Systems  Constitutional: Negative for fever.  HENT: Positive for dental problem. Negative for facial swelling and sore throat.   Respiratory: Negative for shortness of breath.   Musculoskeletal: Negative for neck pain and neck stiffness.     Physical Exam Updated Vital Signs BP (!) 182/66 (BP Location: Left Arm)   Pulse 93   Resp  16   Ht 5\' 2"  (1.575 m)   Wt 84.8 kg   SpO2 95%   BMI 34.20 kg/m   Physical Exam  Constitutional: She is oriented to person, place, and time. She appears well-developed and well-nourished. No distress.  HENT:  Head: Normocephalic and atraumatic.  Right Ear: Tympanic membrane and external ear normal.  Left Ear: Tympanic membrane and external ear normal.  Mouth/Throat: Oropharynx is clear and moist and mucous membranes are normal. No oral lesions. No trismus in the jaw. Dental abscesses present.  Pt is predominantly edentulous with only her lower anterior and lateral incisors, canines present.  Severe gingival recession at the central incisors.  There is a  small ulceration at the base of the right lateral incisor that expresses a small bead of purulence when pressed, but no obvious gingival edema, fluctuance and no induration.  Sublingual space is soft.  No facial edema or erythema.  Eyes: Conjunctivae are normal.  Neck: Normal range of motion. Neck supple.  Cardiovascular: Normal rate and normal heart sounds.   Pulmonary/Chest: Effort normal.  Musculoskeletal: Normal range of motion.  Lymphadenopathy:    She has no cervical adenopathy.  Neurological: She is alert and oriented to person, place, and time.  Has difficulty expressing thoughts.  Becomes frustrated and curses frequently. (baseline per sons)  Skin: Skin is warm and dry. No erythema.  Psychiatric: She has a normal mood and affect.     ED Treatments / Results  Labs (all labs ordered are listed, but only abnormal results are displayed) Labs Reviewed - No data to display  EKG  EKG Interpretation None       Radiology No results found.  Procedures Procedures (including critical care time)  Medications Ordered in ED Medications  amoxicillin (AMOXIL) capsule 500 mg (not administered)     Initial Impression / Assessment and Plan / ED Course  I have reviewed the triage vital signs and the nursing  notes.  Pertinent labs & imaging results that were available during my care of the patient were reviewed by me and considered in my medical decision making (see chart for details).     Pt with gingival infection/abscess, draining.  Amoxil prescribed.  Advised f/u with her dentist next week as planned.  Prn f/u here for any worsened swelling or pain in the interim.    Final Clinical Impressions(s) / ED Diagnoses   Final diagnoses:  Dental abscess    New Prescriptions New Prescriptions   AMOXICILLIN (AMOXIL) 500 MG CAPSULE    Take 1 capsule (500 mg total) by mouth 3 (three) times daily.     Evalee Jefferson, PA-C 08/25/16 [id number]    Isla Pence, MD 08/25/16 [phone]

## 2016-08-24 NOTE — ED Notes (Signed)
Patient and family verbalize understanding of discharge instructions, prescriptions, home care and follow up care. Patient out of department at this time with family.

## 2016-09-12 ENCOUNTER — Other Ambulatory Visit: Payer: Self-pay | Admitting: Family Medicine

## 2016-09-18 ENCOUNTER — Other Ambulatory Visit: Payer: Self-pay | Admitting: Family Medicine

## 2016-09-22 ENCOUNTER — Other Ambulatory Visit: Payer: Self-pay | Admitting: Family Medicine

## 2016-10-15 ENCOUNTER — Other Ambulatory Visit: Payer: Self-pay | Admitting: Family Medicine

## 2016-10-16 ENCOUNTER — Telehealth: Payer: Self-pay | Admitting: Family Medicine

## 2016-10-16 NOTE — Telephone Encounter (Signed)
Rx prior auth APPROVED for pt's hydrOXYzine (ATARAX/VISTARIL) 25 MG tablet   Valid 05/12/2017-05/13/2017  Faxed approval letter to [facility] with note to please notify pt

## 2016-10-16 NOTE — Telephone Encounter (Signed)
Rx prior auth for pt's hydrOXYzine (ATARAX/VISTARIL) 25 MG tablet submitted to [employer] through CoverMyMeds Will take 1-5 business days for decision   Once decision rec'd, need to notify pt & [facility]

## 2016-10-18 ENCOUNTER — Ambulatory Visit (INDEPENDENT_AMBULATORY_CARE_PROVIDER_SITE_OTHER): Payer: Medicare HMO | Admitting: Family Medicine

## 2016-10-18 ENCOUNTER — Encounter: Payer: Self-pay | Admitting: Family Medicine

## 2016-10-18 VITALS — BP 130/78 | Temp 98.1°F | Ht 62.0 in | Wt 188.4 lb

## 2016-10-18 DIAGNOSIS — G8929 Other chronic pain: Secondary | ICD-10-CM

## 2016-10-18 DIAGNOSIS — M545 Low back pain: Secondary | ICD-10-CM | POA: Diagnosis not present

## 2016-10-18 DIAGNOSIS — R7301 Impaired fasting glucose: Secondary | ICD-10-CM | POA: Diagnosis not present

## 2016-10-18 DIAGNOSIS — E785 Hyperlipidemia, unspecified: Secondary | ICD-10-CM | POA: Diagnosis not present

## 2016-10-18 MED ORDER — HYDROCODONE-ACETAMINOPHEN 7.5-325 MG PO TABS: 1.0000 | ORAL_TABLET | Freq: Four times a day (QID) | ORAL | 0 refills | Status: DC | PRN

## 2016-10-18 NOTE — Progress Notes (Signed)
   Subjective:    Patient ID: [name], female    DOB: December 29, 1940, 76 y.o.   MRN: [id number] Patient arrives office for very long discussion HPI Patient in today for generalized pain. States pain is in bilateral arm, back and bilateral legs.   See prior notes we been discussing this pain for a very long time.  Has history of arthritis in hips and knees. Progressive in nature. Patient not interested in surgery. Patient has chronic low back pain. Painful in morning when she first gets up. Also painful and she moves around, no significant radiation into the legs.  Patient also notes increased anxiety and wondered if she can go up on her a Presalin and was advised this is not appropriate Patient notes progressive oral and dental surgery. Next  Patient's husband states that she gets in severe pain for her next pain medication.  States no other concerns this visit.    Review of Systems No headache, no major weight loss or weight gain, no chest pain no back pain abdominal pain no change in bowel habits complete ROS otherwise negative     Objective:   Physical Exam Alert and oriented, vitals reviewed and stable, NAD ENT-TM's and ext canals WNL bilat via otoscopic exam Soft palate, tonsils and post pharynx WNL via oropharyngeal exam Neck-symmetric, no masses; thyroid nonpalpable and nontender Pulmonary-no tachypnea or accessory muscle use; Clear without wheezes via auscultation Card--no abnrml murmurs, rhythm reg and rate WNL Carotid pulses symmetric, without bruits Knees bilateral crepitations bilateral effusions low back pain to percussion.       Assessment & Plan:  Impression 1 severe chronic pain unsatisfactory control current medications very long discussion, which is compromised by patient's a facial left over from stroke plan will increased pain medicine to 7.5 mg 4 times a day. Symptom care discussed. Testing patient wondered if she could go up on her Xanax dose and  was advised that this is not appropriate  Advised to tear up prior prescription for pain meds   Greater than 50% of this 25 minute face to face visit was spent in counseling and discussion and coordination of care regarding the above diagnosis/diagnosies

## 2016-10-19 LAB — LIPID PANEL
CHOL/HDL RATIO: 2.2 ratio (ref 0.0–4.4)
Cholesterol, Total: 148 mg/dL (ref 100–199)
HDL: 66 mg/dL (ref >39)
LDL CALC: 66 mg/dL (ref 0–99)
TRIGLYCERIDES: 79 mg/dL (ref 0–149)
VLDL Cholesterol Cal: 16 mg/dL (ref 5–40)

## 2016-10-19 LAB — HEPATIC FUNCTION PANEL
ALT: 10 IU/L (ref 0–32)
AST: 15 IU/L (ref 0–40)
Albumin: 3.8 g/dL (ref 3.5–4.8)
Alkaline Phosphatase: 67 IU/L (ref 39–117)
BILIRUBIN, DIRECT: 0.05 mg/dL (ref 0.00–0.40)
Bilirubin Total: <0.2 mg/dL (ref 0.0–1.2)
Total Protein: 5.9 g/dL — ABNORMAL LOW (ref 6.0–8.5)

## 2016-10-19 LAB — GLUCOSE, RANDOM: Glucose: 86 mg/dL (ref 65–99)

## 2016-10-21 ENCOUNTER — Encounter: Payer: Self-pay | Admitting: Family Medicine

## 2016-10-21 ENCOUNTER — Other Ambulatory Visit: Payer: Self-pay | Admitting: Family Medicine

## 2016-10-22 NOTE — Telephone Encounter (Signed)
Patient seen 10/18/16. May we refill?

## 2016-10-24 ENCOUNTER — Ambulatory Visit (INDEPENDENT_AMBULATORY_CARE_PROVIDER_SITE_OTHER): Payer: Medicare HMO | Admitting: Family Medicine

## 2016-10-24 ENCOUNTER — Telehealth: Payer: Self-pay | Admitting: Family Medicine

## 2016-10-24 ENCOUNTER — Encounter: Payer: Self-pay | Admitting: Family Medicine

## 2016-10-24 VITALS — BP 138/74 | Temp 97.5°F | Wt 183.0 lb

## 2016-10-24 DIAGNOSIS — N390 Urinary tract infection, site not specified: Secondary | ICD-10-CM | POA: Diagnosis not present

## 2016-10-24 LAB — POCT URINALYSIS DIPSTICK
Spec Grav, UA: 1.02 (ref 1.010–1.025)
pH, UA: 5 (ref 5.0–8.0)

## 2016-10-24 MED ORDER — CIPROFLOXACIN HCL 250 MG PO TABS: 250.0000 mg | ORAL_TABLET | Freq: Two times a day (BID) | ORAL | 0 refills | Status: DC

## 2016-10-24 NOTE — Telephone Encounter (Signed)
error 

## 2016-10-24 NOTE — Progress Notes (Signed)
   Subjective:    Patient ID: [name], female    DOB: [birth year], 76 y.o.   MRN: [id number]  Urinary Tract Infection   This is a new problem. Episode onset: 5 days.  Positive increase frequency. Positive dysuria. Some increase nocturia.    Review of Systems No fever no chills no vomiting    Objective:   Physical Exam Alert vitals stable, NAD. Blood pressure good on repeat. HEENT normal. Lungs clear. Heart regular rate and rhythm.  Urinalysis 4-6 white blood cells per high-power field no epithelials       Assessment & Plan:  Impression urinary tract infection. Likely confined bladder. Symptom care discussed diagnosis discussed antibiotics prescribed

## 2016-10-26 ENCOUNTER — Other Ambulatory Visit: Payer: Self-pay | Admitting: Family Medicine

## 2016-10-26 NOTE — Telephone Encounter (Signed)
Last seen 10/18/16

## 2016-10-26 NOTE — Telephone Encounter (Signed)
May refill this +3 additional refills 

## 2016-11-09 ENCOUNTER — Other Ambulatory Visit: Payer: Self-pay | Admitting: Family Medicine

## 2016-11-09 NOTE — Telephone Encounter (Signed)
Last seen 10/24/16

## 2016-11-19 ENCOUNTER — Other Ambulatory Visit: Payer: Self-pay | Admitting: Family Medicine

## 2016-11-19 ENCOUNTER — Ambulatory Visit: Payer: Medicare HMO | Admitting: Family Medicine

## 2016-11-26 ENCOUNTER — Telehealth: Payer: Self-pay | Admitting: Family Medicine

## 2016-11-26 NOTE — Telephone Encounter (Signed)
Patients daughter is currently in to town, [place].  She called concerned that patient is having hallucinations.  She is a Therapist, sports and feels that she is at high risk for injury.  She understands that we may not be able to talk with her regarding Emelyn, but she would like a call back to let her know if we cannot and she will see what she needs to do.

## 2016-11-26 NOTE — Telephone Encounter (Signed)
Only if daughter has hippa authorization  Would rec a face to face visit ith the pt and daughter if possible,

## 2016-12-21 ENCOUNTER — Other Ambulatory Visit: Payer: Self-pay | Admitting: *Deleted

## 2016-12-21 ENCOUNTER — Telehealth: Payer: Self-pay | Admitting: Family Medicine

## 2016-12-21 ENCOUNTER — Other Ambulatory Visit: Payer: Self-pay | Admitting: Family Medicine

## 2016-12-21 MED ORDER — ALPRAZOLAM 1 MG PO TABS: ORAL_TABLET | ORAL | 5 refills | Status: DC

## 2016-12-21 NOTE — Telephone Encounter (Signed)
Ok if time, may refill monthly for six ref

## 2016-12-21 NOTE — Telephone Encounter (Signed)
Requesting Rx for ALPRAZolam (XANAX) 1 MG tablet .

## 2016-12-24 NOTE — Telephone Encounter (Signed)
Prescription faxed to pharmacy.

## 2017-01-13 ENCOUNTER — Other Ambulatory Visit: Payer: Self-pay | Admitting: Family Medicine

## 2017-01-17 ENCOUNTER — Telehealth: Payer: Self-pay | Admitting: Family Medicine

## 2017-01-17 ENCOUNTER — Ambulatory Visit: Payer: Medicare HMO | Admitting: Family Medicine

## 2017-01-17 NOTE — Telephone Encounter (Signed)
Pt is needing a refill on her pain medication. Pt came for appt. Today but was cancelled due to provider not in office.

## 2017-01-17 NOTE — Telephone Encounter (Signed)
Last seen 10/18/16 for pain management

## 2017-01-18 ENCOUNTER — Other Ambulatory Visit: Payer: Self-pay | Admitting: *Deleted

## 2017-01-18 MED ORDER — HYDROCODONE-ACETAMINOPHEN 7.5-325 MG PO TABS: 1.0000 | ORAL_TABLET | Freq: Four times a day (QID) | ORAL | 0 refills | Status: DC | PRN

## 2017-01-18 NOTE — Telephone Encounter (Signed)
Pt brought in bottle of pain med. Last filled at [facility] 8/7. Due 9/6. Script given to pt.

## 2017-01-18 NOTE — Telephone Encounter (Signed)
Nurses, I checked the drug registry, there is no findings on the drug registry regarding when she got her last pain prescription filled, please talk with the patient find out where she got her last pain prescription filled, please confirm the date, she may have an additional 30 days of pain medicine that can be filled 30 days after the last one. Please follow through on this and I will be happy to sign the prescription in the patient can pick it up later today thank you

## 2017-01-18 NOTE — Telephone Encounter (Signed)
Called [facility] in [place]. They said they have never filled hydrocodone for the pt. Called the pt. Pt states [facility] [place] and [facility]'s where she filled it at.

## 2017-01-18 NOTE — Telephone Encounter (Signed)
Per dr. Nicki Reaper bring last bottle of pain med filled so he can date the prescription. Pt notified.

## 2017-01-24 ENCOUNTER — Ambulatory Visit: Payer: Medicare HMO | Admitting: Family Medicine

## 2017-01-31 ENCOUNTER — Ambulatory Visit: Payer: Medicare HMO | Admitting: Family Medicine

## 2017-02-05 ENCOUNTER — Ambulatory Visit (INDEPENDENT_AMBULATORY_CARE_PROVIDER_SITE_OTHER): Payer: Medicare HMO | Admitting: Family Medicine

## 2017-02-05 ENCOUNTER — Encounter: Payer: Self-pay | Admitting: Family Medicine

## 2017-02-05 VITALS — BP 118/70 | Ht 62.0 in

## 2017-02-05 DIAGNOSIS — I10 Essential (primary) hypertension: Secondary | ICD-10-CM | POA: Diagnosis not present

## 2017-02-05 DIAGNOSIS — Z23 Encounter for immunization: Secondary | ICD-10-CM | POA: Diagnosis not present

## 2017-02-05 DIAGNOSIS — E038 Other specified hypothyroidism: Secondary | ICD-10-CM | POA: Diagnosis not present

## 2017-02-05 DIAGNOSIS — R231 Pallor: Secondary | ICD-10-CM | POA: Diagnosis not present

## 2017-02-05 DIAGNOSIS — E785 Hyperlipidemia, unspecified: Secondary | ICD-10-CM

## 2017-02-05 DIAGNOSIS — D649 Anemia, unspecified: Secondary | ICD-10-CM | POA: Diagnosis not present

## 2017-02-05 LAB — POCT HEMOGLOBIN: Hemoglobin: 9.3 g/dL — AB (ref 12.2–16.2)

## 2017-02-05 MED ORDER — HYDROCODONE-ACETAMINOPHEN 7.5-325 MG PO TABS: 1.0000 | ORAL_TABLET | Freq: Four times a day (QID) | ORAL | 0 refills | Status: DC | PRN

## 2017-02-05 MED ORDER — LISINOPRIL 5 MG PO TABS: 5.0000 mg | ORAL_TABLET | Freq: Every day | ORAL | 1 refills | Status: DC

## 2017-02-05 NOTE — Progress Notes (Signed)
   Subjective:    Patient ID: [name], female    DOB: 1940/11/20, 76 y.o.   MRN: [id number] atient and spouse presents. Once again very much a history of colon and management problem. Patient has history of stroke and some difficulty speaking. Husband has some difficulty with [place] language.   HPI This patient was seen today for chronic pain. Takes for fibromyalgia pain.   The medication list was reviewed and updated.   -Compliance with medication: takes more than prescribed  - Number patient states they take daily: more than prescribed because the pain is getting worse  -when was the last dose patient took? today  The patient was advised the importance of maintaining medication and not using illegal substances with these.  Refills needed: yes  The patient was educated that we can provide 3 monthly scripts for their medication, it is their responsibility to follow the instructions.  Side effects or complications from medications: none  Patient is aware that pain medications are meant to minimize the severity of the pain to allow their pain levels to improve to allow for better function. They are aware of that pain medications cannot totally remove their pain.  Due for UDT ( at least once per year) : due today. Unable to get. Pt's legs giving out and not able to stand  Pt's legs have been giving out. Had to bring pt back in wheelchair today because she could not make it down the hall.   Patient has had anemia since for some time. Ferritin low in the past. Will reassess again. If still low we'll work on GI referral no matter what Hemoccult cards show.        Review of Systems No headache, no major weight loss or weight gain, no chest pain no back pain abdominal pain no change in bowel habits complete ROS otherwise negative     Objective:   Physical Exam  Alert and oriented, vitals reviewed and stable, NAD ENT-TM's and ext canals WNL bilat via otoscopic exam Soft  palate, tonsils and post pharynx WNL via oropharyngeal exam Neck-symmetric, no masses; thyroid nonpalpable and nontender Pulmonary-no tachypnea or accessory muscle use; Clear without wheezes via auscultation Card--no abnrml murmurs, rhythm reg and rate WNL Carotid pulses symmetric, without bruits       Assessment & Plan:  Impression chronic pain. Patient adamant she needs pain medicine. Notes that it has helped her #2 hypertension decent control discussed maintain same #3 hyperlipidemia status uncertain check blood work discussed  #4 anemia. Patient has had anemia in recent years. Associated with low ferritin.has not seengastroenterologist for quite some time. Generally has declined wellness checkups over the years along with preventative interventions.  #5 hyperlipidemia status uncertain discuss will check blood work  #6 status post stroke and chronic anxiety both of which affected patient's emotional lability  #7 aphasia secondary to stroke challenges expressing her symptoms somewhat improved compared to last year.  #8hypo-thyroidism. Compliant with meds discussed. Await TSH  #9 anxiety with element ofdepression decent control per family.  #10 worsening fatigue.  Further recommendations based on blood work  Greater than 50% of this 40 minute face to face visit was spent in counseling and discussion and coordination of care regarding the above diagnosis/diagnosies

## 2017-02-06 LAB — HEPATIC FUNCTION PANEL
ALT: 9 IU/L (ref 0–32)
AST: 20 IU/L (ref 0–40)
Albumin: 3.8 g/dL (ref 3.5–4.8)
Alkaline Phosphatase: 82 IU/L (ref 39–117)
BILIRUBIN TOTAL: 0.2 mg/dL (ref 0.0–1.2)
Bilirubin, Direct: 0.1 mg/dL (ref 0.00–0.40)
Total Protein: 6.1 g/dL (ref 6.0–8.5)

## 2017-02-06 LAB — FERRITIN: FERRITIN: 9 ng/mL — AB (ref 15–150)

## 2017-02-06 LAB — CBC WITH DIFFERENTIAL/PLATELET
BASOS: 1 %
Basophils Absolute: 0.1 10*3/uL (ref 0.0–0.2)
EOS (ABSOLUTE): 0.1 10*3/uL (ref 0.0–0.4)
EOS: 1 %
HEMATOCRIT: 32.5 % — AB (ref 34.0–46.6)
Hemoglobin: 9.5 g/dL — ABNORMAL LOW (ref 11.1–15.9)
Immature Grans (Abs): 0 10*3/uL (ref 0.0–0.1)
Immature Granulocytes: 0 %
LYMPHS ABS: 1.5 10*3/uL (ref 0.7–3.1)
Lymphs: 24 %
MCH: 24 pg — AB (ref 26.6–33.0)
MCHC: 29.2 g/dL — AB (ref 31.5–35.7)
MCV: 82 fL (ref 79–97)
MONOS ABS: 0.4 10*3/uL (ref 0.1–0.9)
Monocytes: 6 %
Neutrophils Absolute: 4.2 10*3/uL (ref 1.4–7.0)
Neutrophils: 68 %
Platelets: 368 10*3/uL (ref 150–379)
RBC: 3.96 x10E6/uL (ref 3.77–5.28)
RDW: 19.9 % — AB (ref 12.3–15.4)
WBC: 6.2 10*3/uL (ref 3.4–10.8)

## 2017-02-06 LAB — TSH: TSH: 3.23 u[IU]/mL (ref 0.450–4.500)

## 2017-02-06 LAB — BASIC METABOLIC PANEL
BUN/Creatinine Ratio: 16 (ref 12–28)
BUN: 11 mg/dL (ref 8–27)
CO2: 19 mmol/L — ABNORMAL LOW (ref 20–29)
Calcium: 9.5 mg/dL (ref 8.7–10.3)
Chloride: 106 mmol/L (ref 96–106)
Creatinine, Ser: 0.67 mg/dL (ref 0.57–1.00)
GFR, EST AFRICAN AMERICAN: 99 mL/min/{1.73_m2} (ref >59)
GFR, EST NON AFRICAN AMERICAN: 86 mL/min/{1.73_m2} (ref >59)
Glucose: 87 mg/dL (ref 65–99)
POTASSIUM: 5 mmol/L (ref 3.5–5.2)
SODIUM: 144 mmol/L (ref 134–144)

## 2017-02-12 ENCOUNTER — Encounter: Payer: Self-pay | Admitting: Family Medicine

## 2017-02-13 ENCOUNTER — Encounter (INDEPENDENT_AMBULATORY_CARE_PROVIDER_SITE_OTHER): Payer: Self-pay | Admitting: Internal Medicine

## 2017-02-17 ENCOUNTER — Emergency Department (HOSPITAL_COMMUNITY): Payer: Medicare HMO

## 2017-02-17 ENCOUNTER — Encounter (HOSPITAL_COMMUNITY): Payer: Self-pay | Admitting: Emergency Medicine

## 2017-02-17 ENCOUNTER — Observation Stay (HOSPITAL_COMMUNITY)
Admission: EM | Admit: 2017-02-17 | Discharge: 2017-02-20 | Disposition: A | Discharge status: Home / Self Care | Payer: Medicare HMO | Attending: Internal Medicine | Admitting: Internal Medicine

## 2017-02-17 DIAGNOSIS — I10 Essential (primary) hypertension: Secondary | ICD-10-CM | POA: Diagnosis not present

## 2017-02-17 DIAGNOSIS — E785 Hyperlipidemia, unspecified: Secondary | ICD-10-CM | POA: Insufficient documentation

## 2017-02-17 DIAGNOSIS — F17210 Nicotine dependence, cigarettes, uncomplicated: Secondary | ICD-10-CM | POA: Diagnosis not present

## 2017-02-17 DIAGNOSIS — R339 Retention of urine, unspecified: Secondary | ICD-10-CM | POA: Insufficient documentation

## 2017-02-17 DIAGNOSIS — Z7189 Other specified counseling: Secondary | ICD-10-CM

## 2017-02-17 DIAGNOSIS — I959 Hypotension, unspecified: Secondary | ICD-10-CM | POA: Diagnosis present

## 2017-02-17 DIAGNOSIS — F0150 Vascular dementia without behavioral disturbance: Secondary | ICD-10-CM | POA: Diagnosis present

## 2017-02-17 DIAGNOSIS — J9811 Atelectasis: Secondary | ICD-10-CM | POA: Diagnosis not present

## 2017-02-17 DIAGNOSIS — R55 Syncope and collapse: Secondary | ICD-10-CM | POA: Diagnosis not present

## 2017-02-17 DIAGNOSIS — M25552 Pain in left hip: Secondary | ICD-10-CM | POA: Diagnosis not present

## 2017-02-17 DIAGNOSIS — Z8673 Personal history of transient ischemic attack (TIA), and cerebral infarction without residual deficits: Secondary | ICD-10-CM | POA: Diagnosis not present

## 2017-02-17 DIAGNOSIS — Z79899 Other long term (current) drug therapy: Secondary | ICD-10-CM | POA: Diagnosis not present

## 2017-02-17 DIAGNOSIS — R69 Illness, unspecified: Secondary | ICD-10-CM | POA: Diagnosis not present

## 2017-02-17 DIAGNOSIS — D638 Anemia in other chronic diseases classified elsewhere: Secondary | ICD-10-CM | POA: Diagnosis present

## 2017-02-17 DIAGNOSIS — J449 Chronic obstructive pulmonary disease, unspecified: Secondary | ICD-10-CM | POA: Insufficient documentation

## 2017-02-17 DIAGNOSIS — N179 Acute kidney failure, unspecified: Secondary | ICD-10-CM | POA: Diagnosis present

## 2017-02-17 DIAGNOSIS — R197 Diarrhea, unspecified: Secondary | ICD-10-CM | POA: Insufficient documentation

## 2017-02-17 DIAGNOSIS — R531 Weakness: Secondary | ICD-10-CM | POA: Diagnosis not present

## 2017-02-17 DIAGNOSIS — Z720 Tobacco use: Secondary | ICD-10-CM | POA: Diagnosis present

## 2017-02-17 DIAGNOSIS — Z515 Encounter for palliative care: Secondary | ICD-10-CM

## 2017-02-17 DIAGNOSIS — R1013 Epigastric pain: Secondary | ICD-10-CM | POA: Diagnosis not present

## 2017-02-17 DIAGNOSIS — S0003XA Contusion of scalp, initial encounter: Secondary | ICD-10-CM | POA: Diagnosis not present

## 2017-02-17 DIAGNOSIS — E039 Hypothyroidism, unspecified: Secondary | ICD-10-CM | POA: Diagnosis present

## 2017-02-17 LAB — CBC WITH DIFFERENTIAL/PLATELET
BASOS ABS: 0 10*3/uL (ref 0.0–0.1)
BASOS PCT: 0 %
EOS ABS: 0 10*3/uL (ref 0.0–0.7)
EOS PCT: 0 %
HCT: 33 % — ABNORMAL LOW (ref 36.0–46.0)
HEMOGLOBIN: 10.3 g/dL — AB (ref 12.0–15.0)
LYMPHS ABS: 0.7 10*3/uL (ref 0.7–4.0)
Lymphocytes Relative: 7 %
MCH: 25.1 pg — ABNORMAL LOW (ref 26.0–34.0)
MCHC: 31.2 g/dL (ref 30.0–36.0)
MCV: 80.3 fL (ref 78.0–100.0)
Monocytes Absolute: 0.5 10*3/uL (ref 0.1–1.0)
Monocytes Relative: 5 %
NEUTROS PCT: 88 %
Neutro Abs: 8.8 10*3/uL — ABNORMAL HIGH (ref 1.7–7.7)
PLATELETS: 453 10*3/uL — AB (ref 150–400)
RBC: 4.11 MIL/uL (ref 3.87–5.11)
RDW: 20.7 % — ABNORMAL HIGH (ref 11.5–15.5)
WBC: 10 10*3/uL (ref 4.0–10.5)

## 2017-02-17 LAB — LACTIC ACID, PLASMA: LACTIC ACID, VENOUS: 0.9 mmol/L (ref 0.5–1.9)

## 2017-02-17 LAB — COMPREHENSIVE METABOLIC PANEL
ALBUMIN: 2.9 g/dL — AB (ref 3.5–5.0)
ALK PHOS: 81 U/L (ref 38–126)
ALT: 18 U/L (ref 14–54)
AST: 36 U/L (ref 15–41)
Anion gap: 9 (ref 5–15)
BUN: 11 mg/dL (ref 6–20)
CALCIUM: 8.8 mg/dL — AB (ref 8.9–10.3)
CHLORIDE: 109 mmol/L (ref 101–111)
CO2: 20 mmol/L — AB (ref 22–32)
CREATININE: 1.12 mg/dL — AB (ref 0.44–1.00)
GFR calc non Af Amer: 46 mL/min — ABNORMAL LOW (ref >60)
GFR, EST AFRICAN AMERICAN: 54 mL/min — AB (ref >60)
GLUCOSE: 136 mg/dL — AB (ref 65–99)
Potassium: 4.3 mmol/L (ref 3.5–5.1)
SODIUM: 138 mmol/L (ref 135–145)
Total Bilirubin: 0.5 mg/dL (ref 0.3–1.2)
Total Protein: 5.8 g/dL — ABNORMAL LOW (ref 6.5–8.1)

## 2017-02-17 LAB — C DIFFICILE QUICK SCREEN W PCR REFLEX
C DIFFICILE (CDIFF) INTERP: NOT DETECTED
C Diff antigen: NEGATIVE
C Diff toxin: NEGATIVE

## 2017-02-17 LAB — TSH: TSH: 3.131 u[IU]/mL (ref 0.350–4.500)

## 2017-02-17 LAB — TROPONIN I: TROPONIN I: 0.03 ng/mL — AB (ref <0.03)

## 2017-02-17 MED ORDER — ACETAMINOPHEN 650 MG RE SUPP: 650.0000 mg | Freq: Four times a day (QID) | RECTAL | Status: DC | PRN

## 2017-02-17 MED ORDER — NORTRIPTYLINE HCL 25 MG PO CAPS
50.0000 mg | ORAL_CAPSULE | Freq: Every day | ORAL | Status: DC
  Administered 2017-02-17 – 2017-02-19 (×2): 50 mg via ORAL
  Filled 2017-02-17 (×3): qty 2

## 2017-02-17 MED ORDER — PRAVASTATIN SODIUM 10 MG PO TABS
20.0000 mg | ORAL_TABLET | Freq: Every day | ORAL | Status: DC
  Administered 2017-02-18 – 2017-02-20 (×3): 20 mg via ORAL
  Filled 2017-02-17 (×3): qty 2

## 2017-02-17 MED ORDER — ALPRAZOLAM 1 MG PO TABS
1.0000 mg | ORAL_TABLET | Freq: Two times a day (BID) | ORAL | Status: DC | PRN
  Administered 2017-02-17 – 2017-02-19 (×5): 1 mg via ORAL
  Filled 2017-02-17 (×5): qty 1

## 2017-02-17 MED ORDER — LEVOTHYROXINE SODIUM 112 MCG PO TABS
112.0000 ug | ORAL_TABLET | Freq: Every day | ORAL | Status: DC
  Administered 2017-02-18 – 2017-02-20 (×3): 112 ug via ORAL
  Filled 2017-02-17 (×3): qty 1

## 2017-02-17 MED ORDER — ONDANSETRON HCL 4 MG/2ML IJ SOLN
4.0000 mg | Freq: Four times a day (QID) | INTRAMUSCULAR | Status: DC | PRN
  Administered 2017-02-18: 4 mg via INTRAVENOUS
  Filled 2017-02-17: qty 2

## 2017-02-17 MED ORDER — PANTOPRAZOLE SODIUM 40 MG PO TBEC
40.0000 mg | DELAYED_RELEASE_TABLET | Freq: Every day | ORAL | Status: DC
  Administered 2017-02-18 – 2017-02-20 (×3): 40 mg via ORAL
  Filled 2017-02-17 (×3): qty 1

## 2017-02-17 MED ORDER — ONDANSETRON HCL 4 MG PO TABS: 4.0000 mg | ORAL_TABLET | Freq: Four times a day (QID) | ORAL | Status: DC | PRN

## 2017-02-17 MED ORDER — ENOXAPARIN SODIUM 40 MG/0.4ML ~~LOC~~ SOLN
40.0000 mg | SUBCUTANEOUS | Status: DC
  Administered 2017-02-17 – 2017-02-19 (×2): 40 mg via SUBCUTANEOUS
  Filled 2017-02-17 (×3): qty 0.4

## 2017-02-17 MED ORDER — SODIUM CHLORIDE 0.9 % IV BOLUS (SEPSIS)
1000.0000 mL | Freq: Once | INTRAVENOUS | Status: AC
  Administered 2017-02-17: 1000 mL via INTRAVENOUS

## 2017-02-17 MED ORDER — ACETAMINOPHEN 325 MG PO TABS
650.0000 mg | ORAL_TABLET | Freq: Four times a day (QID) | ORAL | Status: DC | PRN
  Administered 2017-02-19 – 2017-02-20 (×2): 650 mg via ORAL
  Filled 2017-02-17 (×2): qty 2

## 2017-02-17 MED ORDER — PAROXETINE HCL 20 MG PO TABS
40.0000 mg | ORAL_TABLET | Freq: Every day | ORAL | Status: DC
  Administered 2017-02-18 – 2017-02-20 (×3): 40 mg via ORAL
  Filled 2017-02-17 (×3): qty 2

## 2017-02-17 MED ORDER — LISINOPRIL 5 MG PO TABS
5.0000 mg | ORAL_TABLET | Freq: Every day | ORAL | Status: DC
  Administered 2017-02-18: 5 mg via ORAL
  Filled 2017-02-17 (×2): qty 1

## 2017-02-17 MED ORDER — LOPERAMIDE HCL 2 MG PO CAPS
2.0000 mg | ORAL_CAPSULE | Freq: Once | ORAL | Status: AC
  Administered 2017-02-17: 2 mg via ORAL
  Filled 2017-02-17: qty 1

## 2017-02-17 MED ORDER — LACTATED RINGERS IV SOLN
INTRAVENOUS | Status: DC
  Administered 2017-02-17 – 2017-02-19 (×3): via INTRAVENOUS

## 2017-02-17 MED ORDER — SODIUM CHLORIDE 0.9% FLUSH
3.0000 mL | Freq: Two times a day (BID) | INTRAVENOUS | Status: DC
  Administered 2017-02-17 – 2017-02-20 (×4): 3 mL via INTRAVENOUS

## 2017-02-17 MED ORDER — ACETAMINOPHEN 325 MG PO TABS
650.0000 mg | ORAL_TABLET | Freq: Once | ORAL | Status: AC
  Administered 2017-02-17: 650 mg via ORAL
  Filled 2017-02-17: qty 2

## 2017-02-17 MED ORDER — OXYCODONE HCL 5 MG PO TABS
5.0000 mg | ORAL_TABLET | Freq: Four times a day (QID) | ORAL | Status: DC | PRN
  Administered 2017-02-17 – 2017-02-19 (×4): 5 mg via ORAL
  Filled 2017-02-17 (×4): qty 1

## 2017-02-17 MED ORDER — DOCUSATE SODIUM 100 MG PO CAPS
100.0000 mg | ORAL_CAPSULE | Freq: Two times a day (BID) | ORAL | Status: DC
  Administered 2017-02-18: 100 mg via ORAL
  Filled 2017-02-17 (×4): qty 1

## 2017-02-17 NOTE — ED Notes (Signed)
Report to Allison, RN

## 2017-02-17 NOTE — ED Notes (Signed)
Loose stool

## 2017-02-17 NOTE — ED Provider Notes (Signed)
[facility] DEPT Provider Note   CSN: [id number] Arrival date & time: 02/17/17  1506     History   Chief Complaint Chief Complaint  Patient presents with  . Loss of Consciousness    HPI [name] is a 76 y.o. female.  Level V caveat for complications from a stroke and dementia. Most of history obtained from son and husband. Patient has been feeling poorly lately. She is weak c a poor appetite.  She is bedridden and unable to independently ambulate. When patient was transferred to the EMS stretcher, she apparently had a syncopal episode. She was initially hypotensive and diaphoretic. Glucose on the scene was 178. She has multiple serious health problems well documented in the past medical history       Past Medical History:  Diagnosis Date  . Anxiety   . Aphasia due to stroke   . Apraxia due to stroke   . COPD (chronic obstructive pulmonary disease) (HCC)    [place]  . Fibromyalgia   . Gastritis   . GERD (gastroesophageal reflux disease)   . Headache(784.0)   . Hyperlipidemia   . Hypertension   . Hypothyroidism   . Lumbar spondylosis 01/23/2011  . Osteoporosis   . Stroke ([place])   . Tobacco abuse     Patient Active Problem List   Diagnosis Date Noted  . Seborrheic keratoses 01/14/2016  . Urge incontinence of urine 03/15/2015  . Colitis   . Diarrhea 04/26/2014  . Hypotension 04/26/2014  . AKI (acute kidney injury) ([place]) 04/26/2014  . SIRS (systemic inflammatory response syndrome) ([place]) 04/26/2014  . Anemia of chronic disease   . Dehydration   . Arterial hypotension   . Anemia 03/30/2014  . Chest pain 03/29/2014  . Aphasia due to stroke 03/29/2014  . Apraxia due to stroke 03/29/2014  . Tobacco abuse 03/29/2014  . Depression 03/29/2014  . Hypothyroidism 04/19/2013  . Cerebral artery occlusion with cerebral infarction ([place]) 09/02/2012  . Headache(784.0) 09/02/2012  . Other and unspecified hyperlipidemia 08/13/2012  . Aphasia due to recent cerebral  infarction 01/03/2012  . Lumbar spondylosis 01/23/2011  . Hypokalemia 01/22/2011  . Abdominal lipoma 01/22/2011  . Bronchitis 01/22/2011  . HTN (hypertension), malignant 01/21/2011  . Sinusitis 01/21/2011  . Tinnitus 01/21/2011  . Delirium 01/20/2011  . UTI (urinary tract infection) 01/20/2011  . Anxiety 01/20/2011  . Chronic abdominal pain 01/20/2011  . Hypothyroid 01/20/2011  . Fibromyalgia 01/20/2011  . GERD (gastroesophageal reflux disease) 01/20/2011    Past Surgical History:  Procedure Laterality Date  . CHOLECYSTECTOMY    . COLONOSCOPY WITH ESOPHAGOGASTRODUODENOSCOPY (EGD)  2006   Dr. Laural Golden: normal upper endoscopy with surgically alterated stomach, normal colon but redundant  . ESOPHAGOGASTRODUODENOSCOPY  04/06/2014   Dr.Rourk: abnormal esophagus  . GASTRIC BYPASS     open  . KNEE SURGERY Left     OB History    Gravida Para Term Preterm AB Living   2 2 2     2    SAB TAB Ectopic Multiple Live Births                   Home Medications    Prior to Admission medications   Medication Sig Start Date End Date Taking? Authorizing Provider  acetaminophen (TYLENOL) 325 MG tablet Take 2 tablets (650 mg total) by mouth every 6 (six) hours as needed for mild pain (or Fever >/= 101). 04/30/14   Velvet Bathe, MD  alendronate (FOSAMAX) 70 MG tablet TAKE 1 TABLET BY MOUTH  ONCE A WEEK AS DIRECTED 01/15/17   Mikey Kirschner, MD  ALPRAZolam Duanne Moron) 1 MG tablet TAKE 1 TABLET BY MOUTH AT BEDTIME AND 1/2 A TABLET DURING THE DAY AS NEEDED 12/21/16   Mikey Kirschner, MD  HYDROcodone-acetaminophen (NORCO) 7.5-325 MG tablet Take 1 tablet by mouth every 6 (six) hours as needed for moderate pain. 02/05/17   Mikey Kirschner, MD  hydrOXYzine (ATARAX/VISTARIL) 25 MG tablet TAKE 1 TABLET BY MOUTH EVERY 6 HOURS AS NEEDED FOR ITCHING 10/16/16   Mikey Kirschner, MD  levothyroxine (SYNTHROID, LEVOTHROID) 112 MCG tablet TAKE 1 TABLET BY MOUTH EVERY DAY 09/12/16   Mikey Kirschner, MD    levothyroxine (SYNTHROID, LEVOTHROID) 112 MCG tablet TAKE 1 TABLET BY MOUTH EVERY DAY 09/24/16   Mikey Kirschner, MD  lisinopril (PRINIVIL,ZESTRIL) 5 MG tablet Take 1 tablet (5 mg total) by mouth daily. 02/05/17   Mikey Kirschner, MD  Misc Natural Products (OSTEO BI-FLEX ADV DOUBLE ST PO) Take 1 tablet by mouth daily.    [provider]  Multiple Vitamin (MULTIVITAMIN WITH MINERALS) TABS Take 1 tablet by mouth daily.    [provider]  nortriptyline (PAMELOR) 50 MG capsule TAKE ONE CAPSULE BY MOUTH AT BEDTIME 11/19/16   Mikey Kirschner, MD  pantoprazole (PROTONIX) 40 MG tablet TAKE 1 TABLET BY MOUTH EVERY DAY *STOP RANITIDINE* 12/21/16   Mikey Kirschner, MD  PARoxetine (PAXIL) 40 MG tablet TAKE 1 TABLET BY MOUTH DAILY 11/09/16   Kathyrn Drown, MD  pravastatin (PRAVACHOL) 20 MG tablet TAKE 1 TABLET BY MOUTH EVERY DAY 03/13/16   Mikey Kirschner, MD    Family History Family History  Problem Relation Age of Onset  . Heart attack Father   . Colon cancer Brother     Social History Social History  Substance Use Topics  . Smoking status: Light Tobacco Smoker    Years: 30.00    Types: Cigarettes  . Smokeless tobacco: Never Used     Comment: Smoking Now Every Once & Awhile Per Patient  . Alcohol use No     Allergies   Levaquin [levofloxacin in d5w] and Macrobid [nitrofurantoin macrocrystal]   Review of Systems Review of Systems  Unable to perform ROS: Dementia     Physical Exam Updated Vital Signs BP (!) 108/55   Pulse 87   Temp 100 F (37.8 C) (Rectal)   Resp 15   Ht 5\' 2"  (1.575 m)   Wt 81.6 kg (180 lb)   SpO2 93%   BMI 32.92 kg/m   Physical Exam  Constitutional:  Pale, hypotensive  HENT:  Head: Normocephalic and atraumatic.  Eyes: Conjunctivae are normal.  Neck: Neck supple.  Cardiovascular: Normal rate and regular rhythm.   Pulmonary/Chest: Effort normal and breath sounds normal.  Abdominal: Soft. Bowel sounds are normal.   Musculoskeletal: Normal range of motion.  Neurological: She is alert.  Skin: Skin is warm and dry.  Psychiatric:  Flat affect  Nursing note and vitals reviewed.    ED Treatments / Results  Labs (all labs ordered are listed, but only abnormal results are displayed) Labs Reviewed  CBC WITH DIFFERENTIAL/PLATELET - Abnormal; Notable for the following:       Result Value   Hemoglobin 10.3 (*)    HCT 33.0 (*)    MCH 25.1 (*)    RDW 20.7 (*)    Platelets 453 (*)    Neutro Abs 8.8 (*)    All other components within normal  limits  COMPREHENSIVE METABOLIC PANEL - Abnormal; Notable for the following:    CO2 20 (*)    Glucose, Bld 136 (*)    Creatinine, Ser 1.12 (*)    Calcium 8.8 (*)    Total Protein 5.8 (*)    Albumin 2.9 (*)    GFR calc non Af Amer 46 (*)    GFR calc Af Amer 54 (*)    All other components within normal limits  CULTURE, BLOOD (ROUTINE X 2)  CULTURE, BLOOD (ROUTINE X 2)  URINALYSIS, ROUTINE W REFLEX MICROSCOPIC  TSH    EKG  EKG Interpretation  Date/Time:  Sunday February 17 2017 15:19:13 EDT Ventricular Rate:  102 PR Interval:    QRS Duration: 134 QT Interval:  372 QTC Calculation: 485 R Axis:   -62 Text Interpretation:  Sinus tachycardia RBBB and LAFB LVH with secondary repolarization abnormality No STEMI. Similar to 2016 tracing.  Confirmed by Nanda Quinton [id number]) on 02/17/2017 3:24:36 PM       Radiology Ct Head Wo Contrast  Result Date: 02/17/2017 CLINICAL DATA:  Syncope. EXAM: CT HEAD WITHOUT CONTRAST TECHNIQUE: Contiguous axial images were obtained from the base of the skull through the vertex without intravenous contrast. COMPARISON:  CT head dated July 10, 2016. FINDINGS: Brain: No evidence of acute infarction, hemorrhage, hydrocephalus, extra-axial collection or mass lesion/mass effect. Age-related cerebral atrophy with compensatory dilatation of the ventricles. Unchanged encephalomalacia at the left temporoparietal junction. Age-related  cerebral atrophy with compensatory dilatation of the ventricles, similar to prior study. Mild periventricular white matter and corona radiata hypodensities favor chronic ischemic microvascular white matter disease. Vascular: Intracranial atherosclerotic vascular calcifications. No hyperdense vessel. Skull: Normal. Negative for fracture or focal lesion. Sinuses/Orbits: The bilateral paranasal sinuses and mastoid air cells are clear. The orbits are unremarkable. Other: Small left parietal scalp hematoma. IMPRESSION: 1. No acute intracranial abnormality. Unchanged encephalomalacia at the left temporoparietal junction. 2. Small left parietal scalp hematoma. Electronically Signed   By: Titus Dubin M.D.   On: 02/17/2017 16:07   Dg Chest Port 1 View  Result Date: 02/17/2017 CLINICAL DATA:  Fever, syncope and weakness. EXAM: PORTABLE CHEST 1 VIEW COMPARISON:  04/24/2015 FINDINGS: The heart size and mediastinal contours are within normal limits. A aortic atherosclerosis involving the arch. No definite aneurysm. No pneumonic consolidation, effusion or overt pulmonary edema. Lung volumes are slightly low. Probable minimal atelectasis at the lung bases medially. The visualized skeletal structures are unremarkable. Surgical clips are seen in the right upper quadrant of the abdomen. IMPRESSION: 1. Low lung volumes with minimal bibasilar atelectasis. 2. No active cardiopulmonary disease. 3. Aortic atherosclerosis. Electronically Signed   By: Ashley Royalty M.D.   On: 02/17/2017 16:06    Procedures Procedures (including critical care time)  Medications Ordered in ED Medications  sodium chloride 0.9 % bolus 1,000 mL (0 mLs Intravenous Stopped 02/17/17 1718)  acetaminophen (TYLENOL) tablet 650 mg (650 mg Oral Given 02/17/17 1606)  loperamide (IMODIUM) capsule 2 mg (2 mg Oral Given 02/17/17 1722)     Initial Impression / Assessment and Plan / ED Course  I have reviewed the triage vital signs and the nursing  notes.  Pertinent labs & imaging results that were available during my care of the patient were reviewed by me and considered in my medical decision making (see chart for details).     Patient is in poor health. It appears that she has been in a failure to thrive situation lately. She had a syncopal spell today  while attempting to stand. She was initially hypotensive, but this is improved with IV fluids. She is slightly anemic. Chest x-ray and CT head showed no acute findings. Urinalysis pending. Will need to admit for further evaluation.  Final Clinical Impressions(s) / ED Diagnoses   Final diagnoses:  Syncope, unspecified syncope type    New Prescriptions New Prescriptions   No medications on file     Nat Christen, MD 02/17/17 1754

## 2017-02-17 NOTE — ED Notes (Signed)
Pt with odoriferous stools x 3  A great amount of flatulence

## 2017-02-17 NOTE — ED Notes (Signed)
To radiology

## 2017-02-17 NOTE — H&P (Signed)
History and Physical    [name] FAO:[id number] DOB: [birth year] DOA: 02/17/2017  PCP: Mikey Kirschner, MD Consultants:  None Patient coming from:  Home - lives with husband and brother; NOK: husband, [phone], [phone]  Chief Complaint: weakness  HPI: [name] is a 76 y.o. female with medical history significant of ongoing tobacco dependence; remote CVA; hypothyroidism; HTN: HLD; fibromyalgia; COPD on intermittent O2 presenting with weakness.  She was sitting in the recliner watching tv and ready to go to bed and tried to get up and was unable.  She fell back into the chair.  Also with abdominal pain.  She had been weak for days.  She has been drinking sodas but not eating well.  Abdominal pain for a couple of weeks but really bothering her today.  Occasional diarrhea but no n/v.  Possible subjective fever some nights.  Falling a lot at home.  BP medication changed recently but not last visit on 9/25.  Continued and progressive weakness even since her appointment on 9/25.   ED Course:  Weakness, FTT, hypotensive upon presentation but improved with IVF.  Mild anemia, borderline troponin.  Review of Systems: Unable to assess due to dementia   Ambulatory Status:  Ambulates without assistance - has cane and walker but does not use them  Past Medical History:  Diagnosis Date  . Anxiety   . Aphasia due to stroke   . Apraxia due to stroke   . COPD (chronic obstructive pulmonary disease) (HCC)    [place]  . Fibromyalgia   . Gastritis   . GERD (gastroesophageal reflux disease)   . Headache(784.0)   . Hyperlipidemia   . Hypertension   . Hypothyroidism   . Lumbar spondylosis 01/23/2011  . Osteoporosis   . Stroke ([place])   . Tobacco abuse     Past Surgical History:  Procedure Laterality Date  . CHOLECYSTECTOMY    . COLONOSCOPY WITH ESOPHAGOGASTRODUODENOSCOPY (EGD)  2006   Dr. Laural Golden: normal upper endoscopy with surgically alterated stomach, normal colon but  redundant  . ESOPHAGOGASTRODUODENOSCOPY  04/06/2014   Dr.Rourk: abnormal esophagus  . GASTRIC BYPASS     open  . KNEE SURGERY Left     Social History   Social History  . Marital status: Married    Spouse name: [name]  . Number of children: [name]  . Years of education: [name]   Occupational History  . Not on file.   Social History Main Topics  . Smoking status: Light Tobacco Smoker    Packs/day: 0.33    Years: 64.00    Types: Cigarettes  . Smokeless tobacco: Never Used  . Alcohol use No  . Drug use: No  . Sexual activity: Not Currently   Other Topics Concern  . Not on file   Social History Narrative  . No narrative on file    Allergies  Allergen Reactions  . Levaquin [Levofloxacin In D5w] Swelling    To mouth  . Macrobid [Nitrofurantoin Macrocrystal] Other (See Comments)    Patient can't remember reaction.    Family History  Problem Relation Age of Onset  . Heart attack Father   . Colon cancer Brother     Prior to Admission medications   Medication Sig Start Date End Date Taking? Authorizing Provider  acetaminophen (TYLENOL) 325 MG tablet Take 2 tablets (650 mg total) by mouth every 6 (six) hours as needed for mild pain (or Fever >/= 101). 04/30/14   Velvet Bathe, MD  alendronate (FOSAMAX) 70  MG tablet TAKE 1 TABLET BY MOUTH ONCE A WEEK AS DIRECTED 01/15/17   Mikey Kirschner, MD  ALPRAZolam Duanne Moron) 1 MG tablet TAKE 1 TABLET BY MOUTH AT BEDTIME AND 1/2 A TABLET DURING THE DAY AS NEEDED 12/21/16   Mikey Kirschner, MD  HYDROcodone-acetaminophen (NORCO) 7.5-325 MG tablet Take 1 tablet by mouth every 6 (six) hours as needed for moderate pain. 02/05/17   Mikey Kirschner, MD  hydrOXYzine (ATARAX/VISTARIL) 25 MG tablet TAKE 1 TABLET BY MOUTH EVERY 6 HOURS AS NEEDED FOR ITCHING 10/16/16   Mikey Kirschner, MD  levothyroxine (SYNTHROID, LEVOTHROID) 112 MCG tablet TAKE 1 TABLET BY MOUTH EVERY DAY 09/12/16   Mikey Kirschner, MD  levothyroxine (SYNTHROID, LEVOTHROID) 112 MCG  tablet TAKE 1 TABLET BY MOUTH EVERY DAY 09/24/16   Mikey Kirschner, MD  lisinopril (PRINIVIL,ZESTRIL) 5 MG tablet Take 1 tablet (5 mg total) by mouth daily. 02/05/17   Mikey Kirschner, MD  Misc Natural Products (OSTEO BI-FLEX ADV DOUBLE ST PO) Take 1 tablet by mouth daily.    [provider]  Multiple Vitamin (MULTIVITAMIN WITH MINERALS) TABS Take 1 tablet by mouth daily.    [provider]  nortriptyline (PAMELOR) 50 MG capsule TAKE ONE CAPSULE BY MOUTH AT BEDTIME 11/19/16   Mikey Kirschner, MD  pantoprazole (PROTONIX) 40 MG tablet TAKE 1 TABLET BY MOUTH EVERY DAY *STOP RANITIDINE* 12/21/16   Mikey Kirschner, MD  PARoxetine (PAXIL) 40 MG tablet TAKE 1 TABLET BY MOUTH DAILY 11/09/16   Kathyrn Drown, MD  pravastatin (PRAVACHOL) 20 MG tablet TAKE 1 TABLET BY MOUTH EVERY DAY 03/13/16   Mikey Kirschner, MD    Physical Exam: Vitals:   02/17/17 1800 02/17/17 1830 02/17/17 1843 02/17/17 1845  BP: (!) 114/48 105/60 128/81 (!) 122/51  Pulse: 83   87  Resp: 15 18 19 17   Temp:      TempSrc:      SpO2: 94%   97%  Weight:      Height:         General:  Appears calm and comfortable and is NAD; she was not overly invested in the conversation or able to participate effectively.  Small parietal scalp hematoma palpated but not visible on superficial inspection. Eyes:  PERRLA, EOMI, normal lids, iris ENT:  grossly normal hearing, lips & tongue, mmm; edentulous Neck:  no LAD, masses or thyromegaly; no carotid bruits Cardiovascular:  RRR, no m/r/g. No LE edema.  Respiratory:   CTA bilaterally with no wheezes/rales/rhonchi.  Normal respiratory effort. Abdomen:  soft, NT, ND, NABS Skin:  no rash or induration seen on limited exam Musculoskeletal:  grossly normal tone BUE/BLE, good ROM, no bony abnormality Lower extremity:  No LE edema.  Limited foot exam with no ulcerations.  2+ distal pulses. Psychiatric:  grossly normal mood and affect, speech generally fluent and appropriate  with some aphasia, appears to have mild to moderate cognitive impairment Neurologic:  CN 2-12 grossly intact, moves all extremities in coordinated fashion, sensation intact    Radiological Exams on Admission: Ct Head Wo Contrast  Result Date: 02/17/2017 CLINICAL DATA:  Syncope. EXAM: CT HEAD WITHOUT CONTRAST TECHNIQUE: Contiguous axial images were obtained from the base of the skull through the vertex without intravenous contrast. COMPARISON:  CT head dated July 10, 2016. FINDINGS: Brain: No evidence of acute infarction, hemorrhage, hydrocephalus, extra-axial collection or mass lesion/mass effect. Age-related cerebral atrophy with compensatory dilatation of the ventricles. Unchanged encephalomalacia at the left  temporoparietal junction. Age-related cerebral atrophy with compensatory dilatation of the ventricles, similar to prior study. Mild periventricular white matter and corona radiata hypodensities favor chronic ischemic microvascular white matter disease. Vascular: Intracranial atherosclerotic vascular calcifications. No hyperdense vessel. Skull: Normal. Negative for fracture or focal lesion. Sinuses/Orbits: The bilateral paranasal sinuses and mastoid air cells are clear. The orbits are unremarkable. Other: Small left parietal scalp hematoma. IMPRESSION: 1. No acute intracranial abnormality. Unchanged encephalomalacia at the left temporoparietal junction. 2. Small left parietal scalp hematoma. Electronically Signed   By: Titus Dubin M.D.   On: 02/17/2017 16:07   Dg Chest Port 1 View  Result Date: 02/17/2017 CLINICAL DATA:  Fever, syncope and weakness. EXAM: PORTABLE CHEST 1 VIEW COMPARISON:  04/24/2015 FINDINGS: The heart size and mediastinal contours are within normal limits. A aortic atherosclerosis involving the arch. No definite aneurysm. No pneumonic consolidation, effusion or overt pulmonary edema. Lung volumes are slightly low. Probable minimal atelectasis at the lung bases medially.  The visualized skeletal structures are unremarkable. Surgical clips are seen in the right upper quadrant of the abdomen. IMPRESSION: 1. Low lung volumes with minimal bibasilar atelectasis. 2. No active cardiopulmonary disease. 3. Aortic atherosclerosis. Electronically Signed   By: Ashley Royalty M.D.   On: 02/17/2017 16:06    EKG: Independently reviewed.  Sinus tachycardia with rate 102; RBBB and LAFB; LVH; nonspecific ST changes with no evidence of acute ischemia   Labs on Admission: I have personally reviewed the available labs and imaging studies at the time of the admission.  Pertinent labs:   Glucose 136 BUN 11/Creatinine 1.12/GFR 46; 11/0.67/86 on 9/25 Albumin 2.9 Troponin 0.03 Lactate 0.9 WBC 10.0 Hgb 10.3 Platelets 453 TSH 3.131  Assessment/Plan Principal Problem:   Weakness Active Problems:   Hypothyroidism   Tobacco abuse   Diarrhea   Hypotension   AKI (acute kidney injury) ([place])   Anemia of chronic disease   Vascular dementia   Weakness with Hypotension, AKI and Malnutrition -Patient with prolonged period of poor PO intake presenting with weakness and repeated falls -Hypotensive upon presentation, resolved with IVF -Physical exam generally unremarkable other than probable dementia (see below) -Labs unremarkable other than UA (pending); troponin borderline abnormal, will trend; and AKI -Patient also with recurrent foul-smelling diarrhea while in the ER (although family reports only occasional diarrhea at home - see below) -Largely suspect that her presentation is due to progressive dementia with poor PO intake leading to dehydration/FTT/recurrent falls -Husband does not voice an inability with caring for her at home, although he would appreciate additional resources, if available - will request PT, OT, CM, and nutrition consults -Will continue gentle IVF hydration overnight with trending of troponins; if creatinine and troponin stable in AM, she will likely be  appropriate for d/c to home tomorrow -If UTI appears likely, will need treatment and urine culture -Additionally, she has been taking a variety of sedating medications which may be contributing including Norco 7.5/325 q6h prn (changed to oxy 5 mg q6h prn with tylenol also ordered); Xanax (continued); Vistaril (held); nortriptyline (continued).  Vascular Dementia -She does not appear to have a diagnosis of dementia from her PCP -However, in talking with her and her family it appears that she is not engaging in the conversation around her and family members have noticed intermittent memory concerns -She would benefit from further neurocognitive testing as an outpatient -She may benefit from initiation of Aricept as an outpatient  Hypothyroidism -TSH stable on 9/25 and today -Continue Synthroid at current dose  Diarrhea -Patient with multiple foul-smelling loose stools while in the ER -Will order stool pathogen panel as well as C diff -C diff, if present, could also contribute to weakness/FTT  Anemia -Stable/better than baseline -Of note, she has had recurrent thrombocytosis since May -This may be a myeloproliferative disorder  -Will start low-dose ASA for now -Consider outpatient oncology f/u  Tobacco dependence -Encourage cessation.  This was discussed with the patient and should be reviewed on an ongoing basis.  -Patch declined.  DVT prophylaxis: Lovenox  Code Status:  DNR - confirmed with family Family Communication: Husband and 2 granddaughters present throughout evaluation  Disposition Plan:  Home once clinically improved Consults called: None  Admission status: It is my clinical opinion that referral for OBSERVATION is reasonable and necessary in this patient based on the above information provided. The aforementioned taken together are felt to place the patient at high risk for further clinical deterioration. However it is anticipated that the patient may be medically stable  for discharge from the hospital within 24 to 48 hours.    Karmen Bongo MD [facility]  If note is complete, please contact covering daytime or nighttime physician. [email or web address] Password [facility]  02/17/2017, 8:37 PM

## 2017-02-17 NOTE — ED Triage Notes (Signed)
Per EMS, called out in reference to weakness. When pt was assisted to EMS stretcher, pt had syncopal episode. EMS initiated IV and IV NS. Aprroximately 600cc infused at time of arrival. Pt reports abd pain has improved since EMS arrival. Pt alert, airway patent. Pt diaphoretic. Pt denies chest pain, shortness of breath. cbg 178 en route.

## 2017-02-17 NOTE — ED Notes (Addendum)
Initial blood pressure systolic 74, pt repositioned in trendelenburg systolic 433.  EDP at bedside.

## 2017-02-17 NOTE — ED Notes (Signed)
Pt on bedpan by family while this RN in a code stroke  Family did not cut off suction to flex cath pt with redness from bedpan suction to gluts

## 2017-02-17 NOTE — ED Notes (Signed)
CRITICAL VALUE ALERT  Critical Value:  Troponin 0.03  Date & Time Notied:  02/17/17 & 2006 hrs  Provider Notified: Dr. Lorin Mercy  Orders Received/Actions taken: [name]

## 2017-02-17 NOTE — ED Notes (Signed)
Continues in radiology 

## 2017-02-17 NOTE — ED Notes (Signed)
Call to floor for report  "nurse cannot take report" When asked to give report to buddy, was told "we don't do that"  Awaiting nurse call back

## 2017-02-17 NOTE — ED Notes (Signed)
POC occult blood negative. EDP aware.

## 2017-02-18 DIAGNOSIS — N179 Acute kidney failure, unspecified: Secondary | ICD-10-CM | POA: Diagnosis not present

## 2017-02-18 DIAGNOSIS — R531 Weakness: Secondary | ICD-10-CM | POA: Diagnosis not present

## 2017-02-18 LAB — CBC
HCT: 30.9 % — ABNORMAL LOW (ref 36.0–46.0)
HEMOGLOBIN: 9.9 g/dL — AB (ref 12.0–15.0)
MCH: 25 pg — ABNORMAL LOW (ref 26.0–34.0)
MCHC: 32 g/dL (ref 30.0–36.0)
MCV: 78 fL (ref 78.0–100.0)
Platelets: 439 10*3/uL — ABNORMAL HIGH (ref 150–400)
RBC: 3.96 MIL/uL (ref 3.87–5.11)
RDW: 21 % — ABNORMAL HIGH (ref 11.5–15.5)
WBC: 9.1 10*3/uL (ref 4.0–10.5)

## 2017-02-18 LAB — LACTIC ACID, PLASMA: Lactic Acid, Venous: 1 mmol/L (ref 0.5–1.9)

## 2017-02-18 LAB — URINALYSIS, ROUTINE W REFLEX MICROSCOPIC
Bilirubin Urine: NEGATIVE
GLUCOSE, UA: NEGATIVE mg/dL
KETONES UR: 5 mg/dL — AB
Nitrite: NEGATIVE
PH: 5 (ref 5.0–8.0)
Protein, ur: 30 mg/dL — AB
Specific Gravity, Urine: 1.02 (ref 1.005–1.030)

## 2017-02-18 LAB — BASIC METABOLIC PANEL
ANION GAP: 11 (ref 5–15)
BUN: 12 mg/dL (ref 6–20)
CALCIUM: 8.2 mg/dL — AB (ref 8.9–10.3)
CO2: 18 mmol/L — AB (ref 22–32)
Chloride: 105 mmol/L (ref 101–111)
Creatinine, Ser: 0.91 mg/dL (ref 0.44–1.00)
GFR, EST NON AFRICAN AMERICAN: 60 mL/min — AB (ref >60)
Glucose, Bld: 114 mg/dL — ABNORMAL HIGH (ref 65–99)
Potassium: 3.6 mmol/L (ref 3.5–5.1)
Sodium: 134 mmol/L — ABNORMAL LOW (ref 135–145)

## 2017-02-18 LAB — TROPONIN I

## 2017-02-18 LAB — VITAMIN B12: VITAMIN B 12: 900 pg/mL (ref 180–914)

## 2017-02-18 MED ORDER — BOOST / RESOURCE BREEZE PO LIQD
1.0000 | Freq: Three times a day (TID) | ORAL | Status: DC
  Administered 2017-02-19 – 2017-02-20 (×4): 1 via ORAL

## 2017-02-18 MED ORDER — MEGESTROL ACETATE 400 MG/10ML PO SUSP
200.0000 mg | Freq: Every day | ORAL | Status: DC
  Administered 2017-02-18 – 2017-02-20 (×3): 200 mg via ORAL
  Filled 2017-02-18 (×3): qty 10

## 2017-02-18 MED ORDER — ADULT MULTIVITAMIN W/MINERALS CH
1.0000 | ORAL_TABLET | Freq: Every day | ORAL | Status: DC
  Administered 2017-02-19 – 2017-02-20 (×2): 1 via ORAL
  Filled 2017-02-18 (×2): qty 1

## 2017-02-18 MED ORDER — HYDROCODONE-ACETAMINOPHEN 5-325 MG PO TABS
1.0000 | ORAL_TABLET | Freq: Four times a day (QID) | ORAL | Status: DC | PRN
  Administered 2017-02-19: 1 via ORAL
  Filled 2017-02-18 (×2): qty 1

## 2017-02-18 NOTE — Progress Notes (Signed)
Patient still refusing to get orthostatic vital signs. She began to cry earlier when asked. Patient was given PRN medication and hopefully this will help her calm down so we can get her orthostatics.

## 2017-02-18 NOTE — Care Management Obs Status (Signed)
[place] NOTIFICATION   Patient Details  Name: [name] MRN: [id number] Date of Birth: 04/01/1941   Medicare Observation Status Notification Given:  Yes    Sherald Barge, RN 02/18/2017, 9:29 AM

## 2017-02-18 NOTE — Progress Notes (Addendum)
Initial Nutrition Assessment  DOCUMENTATION CODES:      INTERVENTION:  Boost Breeze po TID, each supplement provides 250 kcal and 9 grams of protein   Regular diet   RD will continue to follow    NUTRITION DIAGNOSIS:   Inadequate oral intake related to acute illness as evidenced by per patient/family report, percent weight loss.  GOAL:   Patient will meet greater than or equal to 90% of their needs   MONITOR:   Supplement acceptance, PO intake, Labs, Weight trends  REASON FOR ASSESSMENT:   Consult COPD Protocol  ASSESSMENT: the patient is a 76 yo female who lives with husband.  She has a hx of smoking COPD and presents with c/o weakness. Her home diet is regular but has not been eating well according to nursing report. Today she did not eat at breakfast/ lunch per NT.    Weight hx: shows loss of 8% (6.3 kg) in the past 14 weeks which is trending toward significant. Her usual range 83-86 kg.   Unable to complete Nutrition-Focused physical exam at this time.  Daughter asked RD to return another time her mom had just went to sleep. Expect this patient is malnourished but unable to diagnose at this time. Will continue to follow for additional details.   Recent Labs Lab 02/17/17 1650 02/18/17 0558  NA 138 134*  K 4.3 3.6  CL 109 105  CO2 20* 18*  BUN 11 12  CREATININE 1.12* 0.91  CALCIUM 8.8* 8.2*  GLUCOSE 136* 114*   labs and meds reviewed. Patient is taking Megace, PPI.   Diet Order:  Diet regular Room service appropriate? Yes; Fluid consistency: Thin  Skin:   (MASD-right and left groin per RN)  Last BM:  10/7 large liquid stool- C. diff (negative)   Height:   Ht Readings from Last 1 Encounters:  02/18/17 5\' 2"  (1.575 m)    Weight:   Wt Readings from Last 1 Encounters:  02/18/17 168 lb 14 oz (76.6 kg)    Ideal Body Weight:  50 kg  BMI:  Body mass index is 30.89 kg/m.  Estimated Nutritional Needs:   Kcal:  1450-1550 (MSJ x 1.2 AF +/- 100  kcal)  Protein:  75-85 gr   Fluid:  1.5-1.6 liters daily  EDUCATION NEEDS:  No education needs identified at this time  Colman Cater MS,RD,CSG,LDN Office: #[phone] Pager: [phone]

## 2017-02-18 NOTE — Care Management Note (Signed)
Case Management Note  Patient Details  Name: [name] MRN: [id number] Date of Birth: 28-Jun-1940  Subjective/Objective:                  Admitted with weakness, AMS and UTI. Pt is from home with husband. She has supplementary home oxygen, uses a RW with ambulation at baseline. Pt very confused this AM and per husband at bedside this is not her norm. PT has recommended SNF.   Action/Plan: Anticipate DC to SNF. CSW will make arrangements. CM may be consulted if SNF pt does not go to SNF.   Expected Discharge Date:     02/20/2017             Expected Discharge Plan:  [place]  In-House Referral:  Clinical Social Work  Discharge planning Services  CM Consult  Post Acute Care Choice:  NA Choice offered to:  NA  Status of Service:  Completed, signed off  Sherald Barge, RN 02/18/2017, 3:43 PM

## 2017-02-18 NOTE — Progress Notes (Signed)
PROGRESS NOTE    [name]  XAJ:[id number] DOB: Dec 02, 1940 DOA: 02/17/2017 PCP: Mikey Kirschner, MD     Brief Narrative:  76 year old lady admitted from home on 10/7 due to weakness and generalized failure to thrive. She has a history of remote CVA and hypothyroidism as well as COPD on intermittent oxygen. She has been getting progressively weaker over the past couple weeks, however yesterday she was sitting in the recliner watching TV and was unable to get up afterwards.   Assessment & Plan:   Principal Problem:   Weakness Active Problems:   Hypothyroidism   Tobacco abuse   Diarrhea   Hypotension   AKI (acute kidney injury) ([place])   Anemia of chronic disease   Vascular dementia   Generalized weakness -Suspect to be multifactorial secondary to dehydration, poor oral intake, acute renal failure, orthostatic hypotension also age and possible diagnosis of dementia. -TSH  within normal limits ; check vitamin B-12 -Seen by PT who is recommending SNF, social work to follow. -Discontinue lisinopril, systolic blood pressure was 80 today.  Acute urinary retention -Keep Foley catheter today, start voiding trial tomorrow.   DVT prophylaxis: Lovenox  Code Status: DO NOT RESUSCITATE  Family Communication: Discussed with husband and son at bedside  Disposition Plan: Still to be determined, family requesting palliative care consultation  Consultants:   None  Procedures:   None  Antimicrobials:  Anti-infectives    None       Subjective: In bed, calm can answer simple questions  Objective: Vitals:   02/17/17 2100 02/17/17 2134 02/18/17 0444 02/18/17 1300  BP: (!) 159/124 (!) 87/54 122/88 129/78  Pulse: 65 (!) 101 92 86  Resp: 18  18 19   Temp: 98.9 F (37.2 C)  98.3 F (36.8 C) 98 F (36.7 C)  TempSrc: Oral  Oral Oral  SpO2: 100%  99% 99%  Weight:   76.6 kg (168 lb 14 oz)   Height: 5\' 2"  (1.575 m)  5\' 2"  (1.575 m)     Intake/Output Summary (Last 24  hours) at 02/18/17 1747 Last data filed at 02/18/17 1500  Gross per 24 hour  Intake          1308.75 ml  Output              555 ml  Net           753.75 ml   Filed Weights   02/17/17 1519 02/18/17 0444  Weight: 81.6 kg (180 lb) 76.6 kg (168 lb 14 oz)    Examination:  General exam: Alert, awake, altho seems confused and disoriented at times Respiratory system: Clear to auscultation. Respiratory effort normal. Cardiovascular system:RRR. No murmurs, rubs, gallops. Gastrointestinal system: Abdomen is nondistended, soft and nontender. No organomegaly or masses felt. Normal bowel sounds heard. Central nervous system: Alert and oriented. No focal neurological deficits. Extremities: No C/C/E, +pedal pulses Skin: No rashes, lesions or ulcers    Data Reviewed: I have personally reviewed following labs and imaging studies  CBC:  Recent Labs Lab 02/17/17 1650 02/18/17 0558  WBC 10.0 9.1  NEUTROABS 8.8*  --   HGB 10.3* 9.9*  HCT 33.0* 30.9*  MCV 80.3 78.0  PLT 453* 094*   Basic Metabolic Panel:  Recent Labs Lab 02/17/17 1650 02/18/17 0558  NA 138 134*  K 4.3 3.6  CL 109 105  CO2 20* 18*  GLUCOSE 136* 114*  BUN 11 12  CREATININE 1.12* 0.91  CALCIUM 8.8* 8.2*   GFR: Estimated  Creatinine Clearance: 50.4 mL/min (by C-G formula based on SCr of 0.91 mg/dL). Liver Function Tests:  Recent Labs Lab 02/17/17 1650  AST 36  ALT 18  ALKPHOS 81  BILITOT 0.5  PROT 5.8*  ALBUMIN 2.9*   No results for input(s): LIPASE, AMYLASE in the last 168 hours. No results for input(s): AMMONIA in the last 168 hours. Coagulation Profile: No results for input(s): INR, PROTIME in the last 168 hours. Cardiac Enzymes:  Recent Labs Lab 02/17/17 1848 02/18/17 0001 02/18/17 0558 02/18/17 1231  TROPONINI 0.03* <0.03 <0.03 <0.03   BNP (last 3 results) No results for input(s): PROBNP in the last 8760 hours. HbA1C: No results for input(s): HGBA1C in the last 72 hours. CBG: No  results for input(s): GLUCAP in the last 168 hours. Lipid Profile: No results for input(s): CHOL, HDL, LDLCALC, TRIG, CHOLHDL, LDLDIRECT in the last 72 hours. Thyroid Function Tests:  Recent Labs  02/17/17 1650  TSH 3.131   Anemia Panel: No results for input(s): VITAMINB12, FOLATE, FERRITIN, TIBC, IRON, RETICCTPCT in the last 72 hours. Urine analysis:    Component Value Date/Time   COLORURINE YELLOW 02/18/2017 0137   APPEARANCEUR CLOUDY (A) 02/18/2017 0137   LABSPEC 1.020 02/18/2017 0137   PHURINE 5.0 02/18/2017 0137   GLUCOSEU NEGATIVE 02/18/2017 0137   HGBUR SMALL (A) 02/18/2017 0137   BILIRUBINUR NEGATIVE 02/18/2017 0137   BILIRUBINUR ++ 09/30/2012 1319   KETONESUR 5 (A) 02/18/2017 0137   PROTEINUR 30 (A) 02/18/2017 0137   UROBILINOGEN 0.2 03/20/2015 1400   NITRITE NEGATIVE 02/18/2017 0137   LEUKOCYTESUR MODERATE (A) 02/18/2017 0137   Sepsis Labs: @LABRCNTIP (procalcitonin:4,lacticidven:4)  ) Recent Results (from the past 240 hour(s))  Blood culture (routine x 2)     Status: None (Preliminary result)   Collection Time: 02/17/17  4:58 PM  Result Value Ref Range Status   Specimen Description BLOOD LEFT ANTECUBITAL  Final   Special Requests   Final    BOTTLES DRAWN AEROBIC AND ANAEROBIC Blood Culture adequate volume   Culture NO GROWTH < 24 HOURS  Final   Report Status PENDING  Incomplete  Blood culture (routine x 2)     Status: None (Preliminary result)   Collection Time: 02/17/17  4:59 PM  Result Value Ref Range Status   Specimen Description BLOOD LEFT HAND  Final   Special Requests   Final    BOTTLES DRAWN AEROBIC AND ANAEROBIC Blood Culture results may not be optimal due to an inadequate volume of blood received in culture bottles   Culture NO GROWTH < 24 HOURS  Final   Report Status PENDING  Incomplete  C difficile quick scan w PCR reflex     Status: None   Collection Time: 02/17/17  7:00 PM  Result Value Ref Range Status   C Diff antigen NEGATIVE NEGATIVE  Final   C Diff toxin NEGATIVE NEGATIVE Final   C Diff interpretation No C. difficile detected.  Final         Radiology Studies: Ct Head Wo Contrast  Result Date: 02/17/2017 CLINICAL DATA:  Syncope. EXAM: CT HEAD WITHOUT CONTRAST TECHNIQUE: Contiguous axial images were obtained from the base of the skull through the vertex without intravenous contrast. COMPARISON:  CT head dated July 10, 2016. FINDINGS: Brain: No evidence of acute infarction, hemorrhage, hydrocephalus, extra-axial collection or mass lesion/mass effect. Age-related cerebral atrophy with compensatory dilatation of the ventricles. Unchanged encephalomalacia at the left temporoparietal junction. Age-related cerebral atrophy with compensatory dilatation of the ventricles, similar to prior  study. Mild periventricular white matter and corona radiata hypodensities favor chronic ischemic microvascular white matter disease. Vascular: Intracranial atherosclerotic vascular calcifications. No hyperdense vessel. Skull: Normal. Negative for fracture or focal lesion. Sinuses/Orbits: The bilateral paranasal sinuses and mastoid air cells are clear. The orbits are unremarkable. Other: Small left parietal scalp hematoma. IMPRESSION: 1. No acute intracranial abnormality. Unchanged encephalomalacia at the left temporoparietal junction. 2. Small left parietal scalp hematoma. Electronically Signed   By: Titus Dubin M.D.   On: 02/17/2017 16:07   Dg Chest Port 1 View  Result Date: 02/17/2017 CLINICAL DATA:  Fever, syncope and weakness. EXAM: PORTABLE CHEST 1 VIEW COMPARISON:  04/24/2015 FINDINGS: The heart size and mediastinal contours are within normal limits. A aortic atherosclerosis involving the arch. No definite aneurysm. No pneumonic consolidation, effusion or overt pulmonary edema. Lung volumes are slightly low. Probable minimal atelectasis at the lung bases medially. The visualized skeletal structures are unremarkable. Surgical clips are seen  in the right upper quadrant of the abdomen. IMPRESSION: 1. Low lung volumes with minimal bibasilar atelectasis. 2. No active cardiopulmonary disease. 3. Aortic atherosclerosis. Electronically Signed   By: Ashley Royalty M.D.   On: 02/17/2017 16:06        Scheduled Meds: . docusate sodium  100 mg Oral BID  . enoxaparin (LOVENOX) injection  40 mg Subcutaneous Q24H  . feeding supplement  1 Container Oral TID BM  . levothyroxine  112 mcg Oral Daily  . lisinopril  5 mg Oral Daily  . megestrol  200 mg Oral Daily  . multivitamin with minerals  1 tablet Oral Daily  . nortriptyline  50 mg Oral QHS  . pantoprazole  40 mg Oral Daily  . PARoxetine  40 mg Oral Daily  . pravastatin  20 mg Oral QAC breakfast  . sodium chloride flush  3 mL Intravenous Q12H   Continuous Infusions: . lactated ringers 75 mL/hr at 02/17/17 2309     LOS: 0 days    Time spent: 25 minutes. Greater than 50% of this time was spent in direct contact with the patient coordinating care.     Lelon Frohlich, MD [facility] Pager [phone]  If 7PM-7AM, please contact night-coverage [email or web address] Password [email or web address] 02/18/2017, 5:47 PM

## 2017-02-18 NOTE — Evaluation (Signed)
Physical Therapy Evaluation Patient Details Name: [name] MRN: [id number] DOB: [birth year] Today's Date: 02/18/2017   History of Present Illness  [name] is a 76 y.o. female with medical history significant of ongoing tobacco dependence; remote CVA; hypothyroidism; HTN: HLD; fibromyalgia; COPD on intermittent O2 presenting with weakness.  She was sitting in the recliner watching tv and ready to go to bed and tried to get up and was unable.  She fell back into the chair.  Also with abdominal pain.  She had been weak for days.  She has been drinking sodas but not eating well.  Abdominal pain for a couple of weeks but really bothering her today.  Occasional diarrhea but no n/v.  Possible subjective fever some nights.  Falling a lot at home.  BP medication changed recently but not last visit on 9/25.  Continued and progressive weakness even since her appointment on 9/25.    Clinical Impression  Patient requires constant verbal and tactile cueing to participate, demeanor more agreeable in PM than AM, but severely limited to weakness and possible confusion for attempting to stand and transfer to chair.  Patient will benefit from continued physical therapy in hospital and recommended venue below to increase strength, balance, endurance for safe ADLs and gait.    Follow Up Recommendations SNF;Supervision/Assistance - 24 hour    Equipment Recommendations       Recommendations for Other Services       Precautions / Restrictions Precautions Precautions: Fall Restrictions Weight Bearing Restrictions: No      Mobility  Bed Mobility Overal bed mobility: Needs Assistance Bed Mobility: Supine to Sit;Sit to Supine     Supine to sit: Max assist Sit to supine: Max assist      Transfers Overall transfer level: Needs assistance Equipment used: Rolling walker (2 wheeled);2 person hand held assist Transfers: Sit to/from Stand Sit to Stand: Max assist         General  transfer comment: Max to partial stand, unable to lock knees due to weakness and/or poor carry over for following instructions  Ambulation/Gait                Stairs            Wheelchair Mobility    Modified Rankin (Stroke Patients Only)       Balance Overall balance assessment: Needs assistance Sitting-balance support: Feet supported;Bilateral upper extremity supported Sitting balance-Leahy Scale: Poor     Standing balance support: Bilateral upper extremity supported;During functional activity Standing balance-Leahy Scale: Poor Standing balance comment: partially stood for approx 5-10 seconds due to BLE weakness                             Pertinent Vitals/Pain Pain Assessment: No/denies pain    Home Living Family/patient expects to be discharged to:: Private residence Living Arrangements: Spouse/significant other;Other relatives (lives with spouse and brother) Available Help at Discharge: Family Type of Home: House Home Access: Stairs to enter Entrance Stairs-Rails: Right;Left;Can reach both Technical brewer of Steps: 3 Home Layout: One level Home Equipment: Cane - single point;Walker - 2 wheels      Prior Function Level of Independence: Independent with assistive device(s)               Hand Dominance        Extremity/Trunk Assessment   Upper Extremity Assessment Upper Extremity Assessment: Generalized weakness    Lower Extremity Assessment Lower Extremity Assessment:  Generalized weakness    Cervical / Trunk Assessment Cervical / Trunk Assessment: Kyphotic  Communication   Communication: Expressive difficulties;Receptive difficulties  Cognition Arousal/Alertness: Awake/alert Behavior During Therapy: Agitated;Restless Overall Cognitive Status: Impaired/Different from baseline Area of Impairment: Orientation                 Orientation Level: Person             General Comments: Patient appears  confused with poor carry over for following instructions      General Comments      Exercises     Assessment/Plan    PT Assessment Patient needs continued PT services  PT Problem List Decreased strength;Decreased activity tolerance;Decreased balance;Decreased mobility       PT Treatment Interventions Gait training;Functional mobility training;Stair training;Therapeutic activities;Therapeutic exercise;Patient/family education    PT Goals (Current goals can be found in the Care Plan section)  Acute Rehab PT Goals Patient Stated Goal: nothing stated by patient, family wants her to go to rehab, then return home PT Goal Formulation: With patient/family Time For Goal Achievement: 03/04/17 Potential to Achieve Goals: Fair    Frequency Min 3X/week   Barriers to discharge        Co-evaluation               AM-PAC PT "6 Clicks" Daily Activity  Outcome Measure Difficulty turning over in bed (including adjusting bedclothes, sheets and blankets)?: A Lot Difficulty moving from lying on back to sitting on the side of the bed? : A Lot Difficulty sitting down on and standing up from a chair with arms (e.g., wheelchair, bedside commode, etc,.)?: Unable Help needed moving to and from a bed to chair (including a wheelchair)?: A Lot Help needed walking in hospital room?: Total Help needed climbing 3-5 steps with a railing? : Total 6 Click Score: 9    End of Session   Activity Tolerance: Patient limited by fatigue;Treatment limited secondary to agitation Patient left: in bed;with call bell/phone within reach;with family/visitor present Nurse Communication: Mobility status PT Visit Diagnosis: Unsteadiness on feet (R26.81);Other abnormalities of gait and mobility (R26.89);Muscle weakness (generalized) (M62.81)    Time: 6629-4765 PT Time Calculation (min) (ACUTE ONLY): 31 min   Charges:   PT Evaluation $PT Eval Moderate Complexity: 1 Mod PT Treatments $Therapeutic Activity:  23-37 mins   PT G Codes:   PT G-Codes **NOT FOR INPATIENT CLASS** Functional Assessment Tool Used: AM-PAC 6 Clicks Basic Mobility Functional Limitation: Mobility: Walking and moving around Mobility: Walking and Moving Around Current Status (Y6503): At least 60 percent but less than 80 percent impaired, limited or restricted Mobility: Walking and Moving Around Goal Status [phone]): At least 60 percent but less than 80 percent impaired, limited or restricted Mobility: Walking and Moving Around Discharge Status [phone]): At least 60 percent but less than 80 percent impaired, limited or restricted     3:42 PM, 02/18/17 Lonell Grandchild, MPT Physical Therapist with [facility] 336 [phone] office [phone] mobile phone

## 2017-02-18 NOTE — Plan of Care (Signed)
Problem: Pain Managment: Goal: General experience of comfort will improve Outcome: Adequate for Discharge Pt c/o generalized pain, given oxycodone x1 per MD order. Pt educated on pain mgmt as well as medications available. Pt verbalized understanding. Will continue to monitor pt

## 2017-02-18 NOTE — Progress Notes (Signed)
After getting pt up to [facility], pt urinated 79ml. Dr Olevia Bowens paged and made aware of bladder scan volume of 441mL and not urinating when up to [facility]. New order for foley catheter to be placed. Will educate pt and place foley

## 2017-02-18 NOTE — Progress Notes (Signed)
Pt has not voided and has said she needs to urinate, but then changes her mind. RN bladder scanned pt with a result of 422mL in bladder. NT to get pt up to [facility] to see if pt voids. If not will page MD for I&O cath.

## 2017-02-18 NOTE — Plan of Care (Signed)
Problem: Pain Managment: Goal: General experience of comfort will improve Outcome: Progressing Patient c/o abdominal pain. According to family this is not new for her. Patient has PRN medication for this that she also takes at home.

## 2017-02-18 NOTE — Progress Notes (Signed)
Patient is rather confused this morning. She knows who she is and that she is in the hospital but she I confused on the date and keeps asking about going to the bathroom. I have told her several times that she has a catheter for this but she does not seem to understand.   Husband is at bedside and says that she is not always like this but sometimes will wake up confused.

## 2017-02-19 ENCOUNTER — Encounter (HOSPITAL_COMMUNITY): Payer: Self-pay | Admitting: Primary Care

## 2017-02-19 ENCOUNTER — Observation Stay (HOSPITAL_COMMUNITY): Payer: Medicare HMO

## 2017-02-19 ENCOUNTER — Other Ambulatory Visit: Payer: Self-pay | Admitting: Nurse Practitioner

## 2017-02-19 DIAGNOSIS — F0150 Vascular dementia without behavioral disturbance: Secondary | ICD-10-CM

## 2017-02-19 DIAGNOSIS — R69 Illness, unspecified: Secondary | ICD-10-CM | POA: Diagnosis not present

## 2017-02-19 DIAGNOSIS — Z515 Encounter for palliative care: Secondary | ICD-10-CM | POA: Diagnosis not present

## 2017-02-19 DIAGNOSIS — Z7189 Other specified counseling: Secondary | ICD-10-CM | POA: Diagnosis not present

## 2017-02-19 DIAGNOSIS — R531 Weakness: Secondary | ICD-10-CM | POA: Diagnosis not present

## 2017-02-19 DIAGNOSIS — M25552 Pain in left hip: Secondary | ICD-10-CM | POA: Diagnosis not present

## 2017-02-19 DIAGNOSIS — N179 Acute kidney failure, unspecified: Secondary | ICD-10-CM | POA: Diagnosis not present

## 2017-02-19 DIAGNOSIS — S79912A Unspecified injury of left hip, initial encounter: Secondary | ICD-10-CM | POA: Diagnosis not present

## 2017-02-19 LAB — GASTROINTESTINAL PANEL BY PCR, STOOL (REPLACES STOOL CULTURE)

## 2017-02-19 NOTE — NC FL2 (Signed)
[place] LEVEL OF CARE SCREENING TOOL     IDENTIFICATION  Patient Name: [name] Birthdate: 19-Oct-1940 Sex: female Admission Date (Current Location): 02/17/2017  [facility] and [place] Number:  [employer] and Address:  [facility] [address], [place]      Provider Number: [id number]  Attending Physician Name and Address:  Isaac Bliss, Olam Idler*  Relative Name and Phone Number:       Current Level of Care: Other (Comment) (Patient is currently in observation) Recommended Level of Care: [place] Prior Approval Number:    Date Approved/Denied:   PASRR Number: [id number] ([id number])  Discharge Plan: SNF    Current Diagnoses: Patient Active Problem List   Diagnosis Date Noted  . Palliative care encounter   . Goals of care, counseling/discussion   . Weakness 02/17/2017  . Vascular dementia 02/17/2017  . Seborrheic keratoses 01/14/2016  . Urge incontinence of urine 03/15/2015  . Colitis   . Diarrhea 04/26/2014  . Hypotension 04/26/2014  . AKI (acute kidney injury) ([place]) 04/26/2014  . SIRS (systemic inflammatory response syndrome) ([place]) 04/26/2014  . Anemia of chronic disease   . Dehydration   . Anemia 03/30/2014  . Chest pain 03/29/2014  . Aphasia due to stroke 03/29/2014  . Apraxia due to stroke 03/29/2014  . Tobacco abuse 03/29/2014  . Depression 03/29/2014  . Hypothyroidism 04/19/2013  . Cerebral artery occlusion with cerebral infarction ([place]) 09/02/2012  . Headache(784.0) 09/02/2012  . Other and unspecified hyperlipidemia 08/13/2012  . Aphasia due to recent cerebral infarction 01/03/2012  . Lumbar spondylosis 01/23/2011  . Hypokalemia 01/22/2011  . Abdominal lipoma 01/22/2011  . Bronchitis 01/22/2011  . HTN (hypertension), malignant 01/21/2011  . Sinusitis 01/21/2011  . Tinnitus 01/21/2011  . Delirium 01/20/2011  . UTI (urinary tract infection) 01/20/2011  . Anxiety  01/20/2011  . Chronic abdominal pain 01/20/2011  . Fibromyalgia 01/20/2011  . GERD (gastroesophageal reflux disease) 01/20/2011    Orientation RESPIRATION BLADDER Height & Weight     Self  Normal Continent Weight: 168 lb 14 oz (76.6 kg) Height:  5\' 2"  (157.5 cm)  BEHAVIORAL SYMPTOMS/MOOD NEUROLOGICAL BOWEL NUTRITION STATUS      Continent Diet (Regular)  AMBULATORY STATUS COMMUNICATION OF NEEDS Skin   Extensive Assist Verbally Normal                       Personal Care Assistance Level of Assistance  Bathing, Feeding, Dressing Bathing Assistance: Maximum assistance Feeding assistance: Limited assistance Dressing Assistance: Maximum assistance     Functional Limitations Info  Sight, Hearing, Speech Sight Info: Adequate Hearing Info: Adequate Speech Info: Adequate    SPECIAL CARE FACTORS FREQUENCY  PT (By licensed PT), OT (By licensed OT)     PT Frequency: 5x/week OT Frequency: 3x/week            Contractures Contractures Info: Not present    Additional Factors Info  Code Status, Allergies, Psychotropic, Isolation Precautions Code Status Info: DNR Allergies Info: Levaquin, Macrobid Psychotropic Info: Xanax, Paxil   Isolation Precautions Info: Enteric precautions     Current Medications (02/19/2017):  This is the current hospital active medication list Current Facility-Administered Medications  Medication Dose Route Frequency Provider Last Rate Last Dose  . acetaminophen (TYLENOL) tablet 650 mg  650 mg Oral Q6H PRN Karmen Bongo, MD       Or  . acetaminophen (TYLENOL) suppository 650 mg  650 mg Rectal Q6H PRN Karmen Bongo,  MD      . ALPRAZolam Duanne Moron) tablet 1 mg  1 mg Oral BID PRN Karmen Bongo, MD   1 mg at 02/19/17 0144  . docusate sodium (COLACE) capsule 100 mg  100 mg Oral BID Karmen Bongo, MD   100 mg at 02/18/17 0905  . enoxaparin (LOVENOX) injection 40 mg  40 mg Subcutaneous Q24H Karmen Bongo, MD   40 mg at 02/17/17 2309  . feeding  supplement (BOOST / RESOURCE BREEZE) liquid 1 Container  1 Container Oral TID BM Isaac Bliss, Rayford Halsted, MD   1 Container at 02/19/17 [phone]  . HYDROcodone-acetaminophen (NORCO/VICODIN) 5-325 MG per tablet 1 tablet  1 tablet Oral Q6H PRN Isaac Bliss, Rayford Halsted, MD   1 tablet at 02/19/17 0144  . lactated ringers infusion   Intravenous Continuous Karmen Bongo, MD 75 mL/hr at 02/19/17 0144    . levothyroxine (SYNTHROID, LEVOTHROID) tablet 112 mcg  112 mcg Oral Daily Karmen Bongo, MD   112 mcg at 02/18/17 [phone]  . megestrol (MEGACE) 400 MG/10ML suspension 200 mg  200 mg Oral Daily Isaac Bliss, Rayford Halsted, MD   200 mg at 02/18/17 1740  . multivitamin with minerals tablet 1 tablet  1 tablet Oral Daily Isaac Bliss, Rayford Halsted, MD      . nortriptyline (PAMELOR) capsule 50 mg  50 mg Oral Ivery Quale, MD   50 mg at 02/17/17 2309  . ondansetron (ZOFRAN) tablet 4 mg  4 mg Oral Q6H PRN Karmen Bongo, MD       Or  . ondansetron [facility]) injection 4 mg  4 mg Intravenous Q6H PRN Karmen Bongo, MD   4 mg at 02/18/17 1437  . oxyCODONE (Oxy IR/ROXICODONE) immediate release tablet 5 mg  5 mg Oral Q6H PRN Karmen Bongo, MD   5 mg at 02/18/17 1437  . pantoprazole (PROTONIX) EC tablet 40 mg  40 mg Oral Daily Karmen Bongo, MD   40 mg at 02/18/17 0904  . PARoxetine (PAXIL) tablet 40 mg  40 mg Oral Daily Karmen Bongo, MD   40 mg at 02/18/17 0904  . pravastatin (PRAVACHOL) tablet 20 mg  20 mg Oral QAC breakfast Karmen Bongo, MD   20 mg at 02/18/17 0904  . sodium chloride flush (NS) 0.9 % injection 3 mL  3 mL Intravenous Q12H Karmen Bongo, MD   3 mL at 02/18/17 3546     Discharge Medications: Please see discharge summary for a list of discharge medications.  Relevant Imaging Results:  Relevant Lab Results:   Additional Information SSN [id number], Clydene Pugh, LCSW

## 2017-02-19 NOTE — Clinical Social Work Note (Signed)
LCSW sought LOG and it was denied due to patient's diagnosis and being observation status.    Micha Erck, Clydene Pugh, LCSW

## 2017-02-19 NOTE — Consult Note (Signed)
Consultation Note Date: 02/19/2017   Patient Name: [name]  DOB: December 02, 1940  MRN: [id number]  Age / Sex: 76 y.o., female  PCP: Mikey Kirschner, MD Referring Physician: Isaac Bliss, Olam Idler*  Reason for Consultation: Establishing goals of care and Psychosocial/spiritual support  HPI/Patient Profile: 76 y.o. female  with past medical history of stroke approximately 4 years ago that lead to aphasia and apraxia, fibromyalgia, high blood pressure and cholesterol, hypothyroidism,current smoker with COPD and intermittent oxygen use, osteoporosis, GER D, gastritis,history of gastric bypass 40+ years ago with staph infection, headache, admitted on 02/17/2017 with weakness with hypotension, acute kidney injury and malnutrition,likely vascular dementia.   Clinical Assessment and Goals of Care: [name] is lying quietly in  She does not open her eyes when I ask her. Present at bedside today is husband of 69 years, [name], son [name], and daughter [name]. We talk about [name] and her functional status at home.  Family states that things have changed for her since her stroke approximately 4 years ago. She is no longer able to manage the  Household, cooking, cleaning, mobility and communication have suffered also.  Daughter states that her mother had hallucinations 3 months ago, she would "pull balls from her leg and tried to hand them to family members". We review her health history in Detail. We review her labs and images in detail. We review the treatment plan in detail. I share a diagram of the chronic illness pathway, what is normal and expected.   Son, Truman Hayward, states that for the last 6 months, [name] has stated that she does "not have the quality of life she wants to live".  He also states that she has stated  "just wanted to die". Daughter states that [name] has stated this her  whole life, that she has had these problems. As we are talking, [name] wakes. She starts talking about her grandchildren visiting her,and while one of her daughters does not love her children. Daughter stops our conversation to focus almost solely on her mother. I share with husband and son that it that [name] has existential suffering. Daughter had previously discussed her mother constant pain, including headaches.   Daughter states her concern is abdomen pain and the possibility of a left hip fracture.  I share I will discuss this with hospitalist. Daughter [name] will leave tomorrow morning, I share with [name] and Truman Hayward that I will return to visit again tomorrow.  Healthcare power of attorney NEXT OF KIN - husband of 26 years, [name], is primary Media planner. Son, [name] lives nearby. Daughter, Knute Neu lives in [place], does hospice nursing. [phone]*   SUMMARY OF RECOMMENDATIONS   At this point, 24 to 48 hours for outcomes.  Open to rehab at [facility].   Code Status/Advance Care Planning:  DNR  Symptom Management:   Per hospitalist, no additional needs at this time.  Palliative Prophylaxis:   Frequent Pain Assessment and Oral Care  Additional Recommendations (  Limitations, Scope, Preferences):  continue to treat the treatable but no CPR, no intubation.  Psycho-social/Spiritual:   Desire for further Chaplaincy support:no  Additional Recommendations: Caregiving  Support/Resources  Prognosis:   < 12 months, possibly less than 6 months would not be surprising based on functional decline over the last 3 to 4 months, frailty.   Discharge Planning: family is open to rehab at [facility].       Primary Diagnoses: Present on Admission: . AKI (acute kidney injury) ([place]) . Anemia of chronic disease . Diarrhea . Hypotension . Hypothyroidism . Tobacco abuse . Vascular dementia   I have reviewed the medical record,  interviewed the patient and family, and examined the patient. The following aspects are pertinent.  Past Medical History:  Diagnosis Date  . Anxiety   . Aphasia due to stroke   . Apraxia due to stroke   . COPD (chronic obstructive pulmonary disease) (HCC)    MIld  . Fibromyalgia   . Gastritis   . GERD (gastroesophageal reflux disease)   . Headache(784.0)   . Hyperlipidemia   . Hypertension   . Hypothyroidism   . Lumbar spondylosis 01/23/2011  . Osteoporosis   . Stroke ([name])   . Tobacco abuse    Social History   Social History  . Marital status: Married    Spouse name: [name]  . Number of children: [name]  . Years of education: [name]   Social History Main Topics  . Smoking status: Light Tobacco Smoker    Packs/day: 0.33    Years: 64.00    Types: Cigarettes  . Smokeless tobacco: Never Used  . Alcohol use No  . Drug use: No  . Sexual activity: Not Currently   Other Topics Concern  . None   Social History Narrative  . None   Family History  Problem Relation Age of Onset  . Heart attack Father   . Colon cancer Brother    Scheduled Meds: . docusate sodium  100 mg Oral BID  . enoxaparin (LOVENOX) injection  40 mg Subcutaneous Q24H  . feeding supplement  1 Container Oral TID BM  . levothyroxine  112 mcg Oral Daily  . megestrol  200 mg Oral Daily  . multivitamin with minerals  1 tablet Oral Daily  . nortriptyline  50 mg Oral QHS  . pantoprazole  40 mg Oral Daily  . PARoxetine  40 mg Oral Daily  . pravastatin  20 mg Oral QAC breakfast  . sodium chloride flush  3 mL Intravenous Q12H   Continuous Infusions: . lactated ringers 75 mL/hr at 02/19/17 0144   PRN Meds:.acetaminophen **OR** acetaminophen, ALPRAZolam, HYDROcodone-acetaminophen, ondansetron **OR** ondansetron (ZOFRAN) IV, oxyCODONE Medications Prior to Admission:  Prior to Admission medications   Medication Sig Start Date End Date Taking? Authorizing Provider  acetaminophen (TYLENOL) 325 MG tablet Take 2  tablets (650 mg total) by mouth every 6 (six) hours as needed for mild pain (or Fever >/= 101). 04/30/14  Yes Velvet Bathe, MD  ALPRAZolam Duanne Moron) 1 MG tablet TAKE 1 TABLET BY MOUTH AT BEDTIME AND 1/2 A TABLET DURING THE DAY AS NEEDED 12/21/16  Yes Mikey Kirschner, MD  carbamide peroxide (DEBROX) 6.5 % OTIC solution Place 5 drops into both ears daily as needed.   Yes [provider]  HYDROcodone-acetaminophen (NORCO) 7.5-325 MG tablet Take 1 tablet by mouth every 6 (six) hours as needed for moderate pain. 02/05/17  Yes Mikey Kirschner, MD  hydrOXYzine (ATARAX/VISTARIL) 25 MG tablet TAKE  1 TABLET BY MOUTH EVERY 6 HOURS AS NEEDED FOR ITCHING 10/16/16  Yes Mikey Kirschner, MD  levothyroxine (SYNTHROID, LEVOTHROID) 112 MCG tablet TAKE 1 TABLET BY MOUTH EVERY DAY 09/12/16  Yes Mikey Kirschner, MD  lisinopril (PRINIVIL,ZESTRIL) 5 MG tablet Take 1 tablet (5 mg total) by mouth daily. 02/05/17  Yes Mikey Kirschner, MD  Multiple Vitamin (MULTIVITAMIN WITH MINERALS) TABS Take 1 tablet by mouth daily.   Yes [provider]  nortriptyline (PAMELOR) 50 MG capsule TAKE ONE CAPSULE BY MOUTH AT BEDTIME 11/19/16  Yes Mikey Kirschner, MD  pantoprazole (PROTONIX) 40 MG tablet TAKE 1 TABLET BY MOUTH EVERY DAY *STOP RANITIDINE* 12/21/16  Yes Mikey Kirschner, MD  PARoxetine (PAXIL) 40 MG tablet TAKE 1 TABLET BY MOUTH DAILY 11/09/16  Yes Kathyrn Drown, MD  pravastatin (PRAVACHOL) 20 MG tablet TAKE 1 TABLET BY MOUTH EVERY DAY 03/13/16  Yes Mikey Kirschner, MD  alendronate (FOSAMAX) 70 MG tablet TAKE 1 TABLET BY MOUTH ONCE A WEEK AS DIRECTED 01/15/17   Mikey Kirschner, MD   Allergies  Allergen Reactions  . Levaquin [Levofloxacin In D5w] Swelling    To mouth  . Macrobid [Nitrofurantoin Macrocrystal] Other (See Comments)    Patient can't remember reaction.   Review of Systems  Unable to perform ROS: Mental status change    Physical Exam  Constitutional: No distress.  HENT:  Temporal wasting   Cardiovascular: Normal rate and regular rhythm.   Pulmonary/Chest: Effort normal. No respiratory distress.  Abdominal: Soft. She exhibits no distension.  Musculoskeletal: She exhibits no edema.  Neurological:  Sleepy, oriented to person only at this time.  Skin: Skin is warm and dry.  Nursing note and vitals reviewed.   Vital Signs: BP 129/78 (BP Location: Right Arm)   Pulse 86   Temp 98 F (36.7 C) (Oral)   Resp 19   Ht 5\' 2"  (1.575 m)   Wt 76.6 kg (168 lb 14 oz)   SpO2 98%   BMI 30.89 kg/m  Pain Assessment: PAINAD POSS *See Group Information*: S-Acceptable,Sleep, easy to arouse Pain Score: 2    SpO2: SpO2: 98 % O2 Device:SpO2: 98 % O2 Flow Rate: .O2 Flow Rate (L/min): 1 L/min  IO: Intake/output summary:  Intake/Output Summary (Last 24 hours) at 02/19/17 1227 Last data filed at 02/19/17 0500  Gross per 24 hour  Intake           863.75 ml  Output              800 ml  Net            63.75 ml    LBM: Last BM Date: 02/19/17 Baseline Weight: Weight: 81.6 kg (180 lb) Most recent weight: Weight: 76.6 kg (168 lb 14 oz)     Palliative Assessment/Data:   Flowsheet Rows     Most Recent Value  Intake Tab  Referral Department  Hospitalist  Unit at Time of Referral  Cardiac/Telemetry Unit  Palliative Care Primary Diagnosis  Other (Comment)  Date Notified  02/18/17  Palliative Care Type  New Palliative care  Reason for referral  Clarify Goals of Care  Date of Admission  02/17/17  Date first seen by Palliative Care  02/19/17  # of days Palliative referral response time  1 Day(s)  # of days IP prior to Palliative referral  1  Clinical Assessment  Palliative Performance Scale Score  30%  Pain Max last 24 hours  Not able to report  Pain Min Last 24 hours  Not able to report  Dyspnea Max Last 24 Hours  Not able to report  Dyspnea Min Last 24 hours  Not able to report  Psychosocial & Spiritual Assessment  Palliative Care Outcomes  Patient/Family meeting held?  Yes  Who  was at the meeting?  husbandRicky, son Truman Hayward, daughter Knute Neu at bedside.   Palliative Care Outcomes  Provided psychosocial or spiritual support, Clarified goals of care  Patient/Family wishes: Interventions discontinued/not started   Mechanical Ventilation      Time In: 1030 Time Out: 1140 Time Total: 70 minutes Greater than 50%  of this time was spent counseling and coordinating care related to the above assessment and plan.  Signed by: Drue Novel, NP   Please contact Palliative Medicine Team phone at [phone] for questions and concerns.  For individual provider: See Shea Evans

## 2017-02-19 NOTE — Progress Notes (Signed)
Physical Therapy Treatment Patient Details Name: [name] MRN: [id number] DOB: [birth year] Today's Date: 02/19/2017    History of Present Illness [name] is a 76 y.o. female with medical history significant of ongoing tobacco dependence; remote CVA; hypothyroidism; HTN: HLD; fibromyalgia; COPD on intermittent O2 presenting with weakness.  She was sitting in the recliner watching tv and ready to go to bed and tried to get up and was unable.  She fell back into the chair.  Also with abdominal pain.  She had been weak for days.  She has been drinking sodas but not eating well.  Abdominal pain for a couple of weeks but really bothering her today.  Occasional diarrhea but no n/v.  Possible subjective fever some nights.  Falling a lot at home.  BP medication changed recently but not last visit on 9/25.  Continued and progressive weakness even since her appointment on 9/25.    PT Comments    Patient much more cooperative and able to participate with therapy.  Patient demonstrated slow labored movement for sit to stands and taking side steps at bedside requiring Mod assist and frequent verbal/tactile cueing.  Patient will benefit from continued physical therapy in hospital and recommended venue below to increase strength, balance, endurance for safe ADLs and gait.    Follow Up Recommendations  SNF;Supervision/Assistance - 24 hour     Equipment Recommendations  None recommended by PT    Recommendations for Other Services       Precautions / Restrictions Precautions Precautions: Fall Restrictions Weight Bearing Restrictions: No    Mobility  Bed Mobility Overal bed mobility: Needs Assistance Bed Mobility: Supine to Sit;Sit to Supine     Supine to sit: Mod assist Sit to supine: Mod assist   General bed mobility comments: has difficulty using UE's for sitting up  Transfers Overall transfer level: Needs assistance Equipment used: Rolling walker (2 wheeled) Transfers:  Sit to/from Stand Sit to Stand: Mod assist         General transfer comment: demonstrates slow labored movement  Ambulation/Gait Ambulation/Gait assistance: Mod assist Ambulation Distance (Feet): 12 Feet Assistive device: Rolling walker (2 wheeled) Gait Pattern/deviations: Decreased step length - right;Decreased step length - left;Decreased stride length Gait velocity: slow Gait velocity interpretation: Below normal speed for age/gender General Gait Details: Patient able to take side steps at bedside demonstrating slow labored movement, tolerated stand/taking steps for up to 5-6 minutes before having to rest x 2 trials   Stairs            Wheelchair Mobility    Modified Rankin (Stroke Patients Only)       Balance Overall balance assessment: Needs assistance Sitting-balance support: Feet supported;Bilateral upper extremity supported Sitting balance-Leahy Scale: Fair     Standing balance support: Bilateral upper extremity supported;During functional activity Standing balance-Leahy Scale: Poor                              Cognition Arousal/Alertness: Awake/alert Behavior During Therapy: WFL for tasks assessed/performed Overall Cognitive Status: Within Functional Limits for tasks assessed                                        Exercises General Exercises - Lower Extremity Ankle Circles/Pumps: Seated;AROM;Both;Strengthening;10 reps Long Arc Quad: Seated;AROM;Strengthening;Both;10 reps Hip Flexion/Marching: Seated;AROM;Strengthening;Both;10 reps    General Comments  Pertinent Vitals/Pain Pain Assessment: Faces Pain Score: 0-No pain Faces Pain Scale: No hurt    Home Living                      Prior Function            PT Goals (current goals can now be found in the care plan section)      Frequency    Min 3X/week      PT Plan Current plan remains appropriate    Co-evaluation               AM-PAC PT "6 Clicks" Daily Activity  Outcome Measure  Difficulty turning over in bed (including adjusting bedclothes, sheets and blankets)?: A Lot Difficulty moving from lying on back to sitting on the side of the bed? : A Lot Difficulty sitting down on and standing up from a chair with arms (e.g., wheelchair, bedside commode, etc,.)?: A Lot Help needed moving to and from a bed to chair (including a wheelchair)?: A Lot Help needed walking in hospital room?: Total   6 Click Score: 9    End of Session Equipment Utilized During Treatment: Gait belt Activity Tolerance: Patient tolerated treatment well;Patient limited by fatigue Patient left: in bed;with call bell/phone within reach;with family/visitor present Nurse Communication: Mobility status PT Visit Diagnosis: Unsteadiness on feet (R26.81);Other abnormalities of gait and mobility (R26.89);Muscle weakness (generalized) (M62.81)     Time: 1410-1443 PT Time Calculation (min) (ACUTE ONLY): 33 min  Charges:  $Therapeutic Activity: 23-37 mins                    G Codes:  Functional Assessment Tool Used: AM-PAC 6 Clicks Basic Mobility Functional Limitation: Mobility: Walking and moving around Mobility: Walking and Moving Around Current Status (P8242): At least 60 percent but less than 80 percent impaired, limited or restricted Mobility: Walking and Moving Around Goal Status [phone]): At least 60 percent but less than 80 percent impaired, limited or restricted Mobility: Walking and Moving Around Discharge Status [phone]): At least 60 percent but less than 80 percent impaired, limited or restricted    2:49 PM, 02/19/17 Lonell Grandchild, MPT Physical Therapist with [facility] 336 [phone] office [phone] mobile phone

## 2017-02-19 NOTE — Progress Notes (Signed)
PROGRESS NOTE    [name]  VEL:[id number] DOB: 1941-01-05 DOA: 02/17/2017 PCP: Mikey Kirschner, MD     Brief Narrative:  76 year old lady admitted from home on 10/7 due to weakness and generalized failure to thrive. She has a history of remote CVA and hypothyroidism as well as COPD on intermittent oxygen. She has been getting progressively weaker over the past couple weeks, however yesterday she was sitting in the recliner watching TV and was unable to get up afterwards.   Assessment & Plan:   Principal Problem:   Weakness Active Problems:   Hypothyroidism   Tobacco abuse   Diarrhea   Hypotension   AKI (acute kidney injury) ([place])   Anemia of chronic disease   Vascular dementia   Palliative care encounter   Goals of care, counseling/discussion   Generalized weakness -Suspect to be multifactorial secondary to dehydration, poor oral intake, acute renal failure, orthostatic hypotension also age and possible diagnosis of dementia. -TSH  within normal limits ; B12 WNL. -Seen by PT who is recommending SNF, social work following. -Discontinue lisinopril, systolic blood pressure was 80 today.  Hypotension -BP improved with cessation of lisinopril.  Left Hip Pain -Xray negative for fracture. -Severe bilateral hip OA.  Acute urinary retention -DC foley today and start voiding trial.   DVT prophylaxis: Lovenox  Code Status: DO NOT RESUSCITATE  Family Communication: Discussed with husband and daughter at bedside  Disposition Plan: SNF. Unclear if from hospital or from home pending insurance authorization. Consultants:   None  Procedures:   None  Antimicrobials:  Anti-infectives    None       Subjective: Lying in bed, calm. Seems confused at times.  Objective: Vitals:   02/17/17 2134 02/18/17 0444 02/18/17 1300 02/18/17 2041  BP: (!) 87/54 122/88 129/78   Pulse: (!) 101 92 86   Resp:  18 19   Temp:  98.3 F (36.8 C) 98 F (36.7 C)   TempSrc:   Oral Oral   SpO2:  99% 99% 98%  Weight:  76.6 kg (168 lb 14 oz)    Height:  5\' 2"  (1.575 m)      Intake/Output Summary (Last 24 hours) at 02/19/17 1644 Last data filed at 02/19/17 1100  Gross per 24 hour  Intake              240 ml  Output             2500 ml  Net            -2260 ml   Filed Weights   02/17/17 1519 02/18/17 0444  Weight: 81.6 kg (180 lb) 76.6 kg (168 lb 14 oz)    Examination:  General exam: Awake, alert, but seems confused at times. Respiratory system: Clear to auscultation. Respiratory effort normal. Cardiovascular system:RRR. No murmurs, rubs, gallops. Gastrointestinal system: Abdomen is nondistended, soft and nontender. No organomegaly or masses felt. Normal bowel sounds heard. Central nervous system: Alert and oriented. No focal neurological deficits. Extremities: No C/C/E, +pedal pulses Skin: No rashes, lesions or ulcers Psychiatry: Judgement and insight appear normal. Mood & affect appropriate.      Data Reviewed: I have personally reviewed following labs and imaging studies  CBC:  Recent Labs Lab 02/17/17 1650 02/18/17 0558  WBC 10.0 9.1  NEUTROABS 8.8*  --   HGB 10.3* 9.9*  HCT 33.0* 30.9*  MCV 80.3 78.0  PLT 453* 258*   Basic Metabolic Panel:  Recent Labs Lab 02/17/17 1650 02/18/17  0558  NA 138 134*  K 4.3 3.6  CL 109 105  CO2 20* 18*  GLUCOSE 136* 114*  BUN 11 12  CREATININE 1.12* 0.91  CALCIUM 8.8* 8.2*   GFR: Estimated Creatinine Clearance: 50.4 mL/min (by C-G formula based on SCr of 0.91 mg/dL). Liver Function Tests:  Recent Labs Lab 02/17/17 1650  AST 36  ALT 18  ALKPHOS 81  BILITOT 0.5  PROT 5.8*  ALBUMIN 2.9*   No results for input(s): LIPASE, AMYLASE in the last 168 hours. No results for input(s): AMMONIA in the last 168 hours. Coagulation Profile: No results for input(s): INR, PROTIME in the last 168 hours. Cardiac Enzymes:  Recent Labs Lab 02/17/17 1848 02/18/17 0001 02/18/17 0558 02/18/17 1231    TROPONINI 0.03* <0.03 <0.03 <0.03   BNP (last 3 results) No results for input(s): PROBNP in the last 8760 hours. HbA1C: No results for input(s): HGBA1C in the last 72 hours. CBG: No results for input(s): GLUCAP in the last 168 hours. Lipid Profile: No results for input(s): CHOL, HDL, LDLCALC, TRIG, CHOLHDL, LDLDIRECT in the last 72 hours. Thyroid Function Tests:  Recent Labs  02/17/17 1650  TSH 3.131   Anemia Panel:  Recent Labs  02/18/17 1802  VITAMINB12 900   Urine analysis:    Component Value Date/Time   COLORURINE YELLOW 02/18/2017 0137   APPEARANCEUR CLOUDY (A) 02/18/2017 0137   LABSPEC 1.020 02/18/2017 0137   PHURINE 5.0 02/18/2017 0137   GLUCOSEU NEGATIVE 02/18/2017 0137   HGBUR SMALL (A) 02/18/2017 0137   BILIRUBINUR NEGATIVE 02/18/2017 0137   BILIRUBINUR ++ 09/30/2012 1319   KETONESUR 5 (A) 02/18/2017 0137   PROTEINUR 30 (A) 02/18/2017 0137   UROBILINOGEN 0.2 03/20/2015 1400   NITRITE NEGATIVE 02/18/2017 0137   LEUKOCYTESUR MODERATE (A) 02/18/2017 0137   Sepsis Labs: @LABRCNTIP (procalcitonin:4,lacticidven:4)  ) Recent Results (from the past 240 hour(s))  Blood culture (routine x 2)     Status: None (Preliminary result)   Collection Time: 02/17/17  4:58 PM  Result Value Ref Range Status   Specimen Description BLOOD LEFT ANTECUBITAL  Final   Special Requests   Final    BOTTLES DRAWN AEROBIC AND ANAEROBIC Blood Culture adequate volume   Culture NO GROWTH 2 DAYS  Final   Report Status PENDING  Incomplete  Blood culture (routine x 2)     Status: None (Preliminary result)   Collection Time: 02/17/17  4:59 PM  Result Value Ref Range Status   Specimen Description BLOOD LEFT HAND  Final   Special Requests   Final    BOTTLES DRAWN AEROBIC AND ANAEROBIC Blood Culture results may not be optimal due to an inadequate volume of blood received in culture bottles   Culture NO GROWTH 2 DAYS  Final   Report Status PENDING  Incomplete  Gastrointestinal Panel by  PCR , Stool     Status: None   Collection Time: 02/17/17  7:00 PM  Result Value Ref Range Status   Campylobacter species NOT DETECTED NOT DETECTED Final   Plesimonas shigelloides NOT DETECTED NOT DETECTED Final   Salmonella species NOT DETECTED NOT DETECTED Final   Yersinia enterocolitica NOT DETECTED NOT DETECTED Final   Vibrio species NOT DETECTED NOT DETECTED Final   Vibrio cholerae NOT DETECTED NOT DETECTED Final   Enteroaggregative E coli (EAEC) NOT DETECTED NOT DETECTED Final   Enteropathogenic E coli (EPEC) NOT DETECTED NOT DETECTED Final   Enterotoxigenic E coli (ETEC) NOT DETECTED NOT DETECTED Final   Shiga like toxin  producing E coli (STEC) NOT DETECTED NOT DETECTED Final   Shigella/Enteroinvasive E coli (EIEC) NOT DETECTED NOT DETECTED Final   Cryptosporidium NOT DETECTED NOT DETECTED Final   Cyclospora cayetanensis NOT DETECTED NOT DETECTED Final   Entamoeba histolytica NOT DETECTED NOT DETECTED Final   Giardia lamblia NOT DETECTED NOT DETECTED Final   Adenovirus F40/41 NOT DETECTED NOT DETECTED Final   Astrovirus NOT DETECTED NOT DETECTED Final   Norovirus GI/GII NOT DETECTED NOT DETECTED Final   Rotavirus A NOT DETECTED NOT DETECTED Final   Sapovirus (I, II, IV, and V) NOT DETECTED NOT DETECTED Final  C difficile quick scan w PCR reflex     Status: None   Collection Time: 02/17/17  7:00 PM  Result Value Ref Range Status   C Diff antigen NEGATIVE NEGATIVE Final   C Diff toxin NEGATIVE NEGATIVE Final   C Diff interpretation No C. difficile detected.  Final         Radiology Studies: Dg Hip Unilat With Pelvis 2-3 Views Left  Result Date: 02/19/2017 CLINICAL DATA:  Left hip pain since a fall in a bathroom 02/16/2017. Initial encounter. EXAM: DG HIP (WITH OR WITHOUT PELVIS) 2-3V LEFT COMPARISON:  None. FINDINGS: No acute bony or joint abnormality is seen on the right or left. There is joint space narrowing of both hips with subchondral sclerosis and cyst formation on  the right. No evidence of avascular necrosis of the femoral heads. Lower lumbar spondylosis is noted. Atherosclerotic vascular disease is seen. IMPRESSION: No acute abnormality. Moderate to moderately severe bilateral hip osteoarthritis appears worse on the right. Atherosclerosis. Electronically Signed   By: Inge Rise M.D.   On: 02/19/2017 15:23        Scheduled Meds: . docusate sodium  100 mg Oral BID  . enoxaparin (LOVENOX) injection  40 mg Subcutaneous Q24H  . feeding supplement  1 Container Oral TID BM  . levothyroxine  112 mcg Oral Daily  . megestrol  200 mg Oral Daily  . multivitamin with minerals  1 tablet Oral Daily  . nortriptyline  50 mg Oral QHS  . pantoprazole  40 mg Oral Daily  . PARoxetine  40 mg Oral Daily  . pravastatin  20 mg Oral QAC breakfast  . sodium chloride flush  3 mL Intravenous Q12H   Continuous Infusions: . lactated ringers 75 mL/hr at 02/19/17 0144     LOS: 0 days    Time spent: 25 minutes. Greater than 50% of this time was spent in direct contact with the patient coordinating care.     Lelon Frohlich, MD [facility] Pager [phone]  If 7PM-7AM, please contact night-coverage [email or web address] Password [email or web address] 02/19/2017, 4:44 PM

## 2017-02-19 NOTE — Evaluation (Signed)
Occupational Therapy Evaluation Patient Details Name: [name] MRN: [id number] DOB: 10/29/1940 Today's Date: 02/19/2017    History of Present Illness [name] is a 76 y.o. female with medical history significant of ongoing tobacco dependence; remote CVA; hypothyroidism; HTN: HLD; fibromyalgia; COPD on intermittent O2 presenting with weakness.  She was sitting in the recliner watching tv and ready to go to bed and tried to get up and was unable.  She fell back into the chair.  Also with abdominal pain.  She had been weak for days.  She has been drinking sodas but not eating well.  Abdominal pain for a couple of weeks but really bothering her today.  Occasional diarrhea but no n/v.  Possible subjective fever some nights.  Falling a lot at home.  BP medication changed recently but not last visit on 9/25.  Continued and progressive weakness even since her appointment on 9/25.   Clinical Impression   Pt received semi-reclined in bed, son present during evaluation. PTA pt able to assist with ADL completion, husband assists with all ADL completion, level of assistance unknown as husband not present for evaluation. At this time pt requiring max to total assistance for all ADLs due to cognition and weakness. Pt emotionally labile during evaluation, oriented to person only, asking for her father. Pt unable/unwilling to participate ADL completion/mobility tasks, grimacing with P/ROM to RUE; unable to follow simple one-step commands. Will continue to see pt while in acute care, recommend SNF on discharge as pt is requiring increased level of assistance for safety and completion of B/ADLs.     Follow Up Recommendations  SNF;Supervision/Assistance - 24 hour    Equipment Recommendations  None recommended by OT       Precautions / Restrictions Precautions Precautions: Fall Restrictions Weight Bearing Restrictions: No      Mobility Bed Mobility               General bed mobility  comments: not tested due to pt cognition  Transfers                 General transfer comment: not tested due to pt cognition        ADL either performed or assessed with clinical judgement   ADL Overall ADL's : Needs assistance/impaired Eating/Feeding: Moderate assistance;Bed level   Grooming: Maximal assistance;Bed level   Upper Body Bathing: Total assistance;Bed level   Lower Body Bathing: Total assistance;Bed level   Upper Body Dressing : Maximal assistance;Bed level   Lower Body Dressing: Total assistance;Bed level   Toilet Transfer: Maximal assistance (bedpan)   Toileting- Clothing Manipulation and Hygiene: Total assistance;Bed level               Vision Baseline Vision/History:  (unknown) Patient Visual Report: No change from baseline              Pertinent Vitals/Pain Pain Assessment: Faces Faces Pain Scale: Hurts a little bit Pain Location: head Pain Descriptors / Indicators: Aching Pain Intervention(s): Limited activity within patient's tolerance;Monitored during session     Hand Dominance  (unsure)   Extremity/Trunk Assessment Upper Extremity Assessment Upper Extremity Assessment: Generalized weakness   Lower Extremity Assessment Lower Extremity Assessment: Defer to PT evaluation   Cervical / Trunk Assessment Cervical / Trunk Assessment: Kyphotic   Communication Communication Communication: Expressive difficulties;Receptive difficulties   Cognition Arousal/Alertness: Awake/alert Behavior During Therapy: Restless Overall Cognitive Status: Impaired/Different from baseline Area of Impairment: Orientation  Orientation Level: Person             General Comments: Pt confused and emotionally labile this am; asking for her Dad (who is deceased)              Home Living Family/patient expects to be discharged to:: Private residence Living Arrangements: Spouse/significant other;Other relatives  (brother) Available Help at Discharge: Family Type of Home: House Home Access: Stairs to enter [employer] of Steps: 3 Entrance Stairs-Rails: Right;Left;Can reach both Home Layout: One level         Biochemist, clinical: [place] - single point;Walker - 2 wheels          Prior Functioning/Environment Level of Independence: Independent with assistive device(s)                 OT Problem List: Decreased strength;Decreased activity tolerance;Impaired balance (sitting and/or standing);Decreased coordination;Decreased cognition;Decreased safety awareness;Decreased knowledge of use of DME or AE;Impaired UE functional use      OT Treatment/Interventions: Self-care/ADL training;Therapeutic exercise;DME and/or AE instruction;Therapeutic activities;Cognitive remediation/compensation;Patient/family education    OT Goals(Current goals can be found in the care plan section) Acute Rehab OT Goals Patient Stated Goal: none stated  OT Frequency: Min 2X/week    AM-PAC PT "6 Clicks" Daily Activity     Outcome Measure Help from another person eating meals?: A Lot Help from another person taking care of personal grooming?: A Lot Help from another person toileting, which includes using toliet, bedpan, or urinal?: Total Help from another person bathing (including washing, rinsing, drying)?: Total Help from another person to put on and taking off regular upper body clothing?: Total Help from another person to put on and taking off regular lower body clothing?: Total 6 Click Score: 8   End of Session    Activity Tolerance: Patient limited by lethargy (limited due to cognition) Patient left: in bed;with call bell/phone within reach;with bed alarm set  OT Visit Diagnosis: Muscle weakness (generalized) (M62.81)                Time: 2703-5009 OT Time Calculation (min): 17 min Charges:  OT General Charges $OT Visit: 1 Visit OT Evaluation $OT Eval Moderate  Complexity: 1 Mod G-Codes: OT G-codes **NOT FOR INPATIENT CLASS** Functional Assessment Tool Used: AM-PAC 6 Clicks Daily Activity Functional Limitation: Self care Self Care Current Status (F8182): At least 80 percent but less than 100 percent impaired, limited or restricted Self Care Goal Status (X9371): At least 80 percent but less than 100 percent impaired, limited or restricted   Guadelupe Sabin, OTR/L  [phone] 02/19/2017, 8:58 AM

## 2017-02-20 DIAGNOSIS — I959 Hypotension, unspecified: Secondary | ICD-10-CM | POA: Diagnosis not present

## 2017-02-20 DIAGNOSIS — R531 Weakness: Secondary | ICD-10-CM

## 2017-02-20 DIAGNOSIS — N179 Acute kidney failure, unspecified: Secondary | ICD-10-CM

## 2017-02-20 DIAGNOSIS — R197 Diarrhea, unspecified: Secondary | ICD-10-CM

## 2017-02-20 DIAGNOSIS — Z720 Tobacco use: Secondary | ICD-10-CM

## 2017-02-20 DIAGNOSIS — R55 Syncope and collapse: Secondary | ICD-10-CM | POA: Diagnosis not present

## 2017-02-20 DIAGNOSIS — D638 Anemia in other chronic diseases classified elsewhere: Secondary | ICD-10-CM

## 2017-02-20 DIAGNOSIS — Z7401 Bed confinement status: Secondary | ICD-10-CM | POA: Diagnosis not present

## 2017-02-20 DIAGNOSIS — M25552 Pain in left hip: Secondary | ICD-10-CM | POA: Diagnosis not present

## 2017-02-20 DIAGNOSIS — R279 Unspecified lack of coordination: Secondary | ICD-10-CM | POA: Diagnosis not present

## 2017-02-20 MED ORDER — LOPERAMIDE HCL 2 MG PO CAPS
2.0000 mg | ORAL_CAPSULE | ORAL | Status: DC | PRN
  Administered 2017-02-20: 2 mg via ORAL
  Filled 2017-02-20: qty 1

## 2017-02-20 NOTE — Care Management Note (Signed)
Case Management Note  Patient Details  Name: SAMAYA BOARDLEY MRN: [id number] Date of Birth: December 02, 1940  Expected Discharge Date:  02/20/17               Expected Discharge Plan:  [place]  In-House Referral:  Clinical Social Work  Discharge planning Services  CM Consult  Post Acute Care Choice:  Home Health Choice offered to:  NA  HH Arranged:  RN, PT, Nurse's Aide [place] Agency:  [facility]  Status of Service:  Completed, signed off  Additional Comments: Discharging today. Has chosen to go home with [facility] services. Husband has chosen [facility] from list of [facility] providers, aware [facility] has 48 hrs to make first visit. Pt has no DME needs. Options for getting PCS services discussed with husband and son. Husband and son both present for DC planning. CM has made arrangements for EMS transport at [place], [facility] rep, aware of referral and will pull pt info from chart.    Sherald Barge, RN 02/20/2017, 12:07 PM

## 2017-02-20 NOTE — Progress Notes (Signed)
Physical Therapy Treatment Patient Details Name: [name] MRN: [id number] DOB: [birth year] Today's Date: 02/20/2017    History of Present Illness [name] is a 76 y.o. female with medical history significant of ongoing tobacco dependence; remote CVA; hypothyroidism; HTN: HLD; fibromyalgia; COPD on intermittent O2 presenting with weakness.  She was sitting in the recliner watching tv and ready to go to bed and tried to get up and was unable.  She fell back into the chair.  Also with abdominal pain.  She had been weak for days.  She has been drinking sodas but not eating well.  Abdominal pain for a couple of weeks but really bothering her today.  Occasional diarrhea but no n/v.  Possible subjective fever some nights.  Falling a lot at home.  BP medication changed recently but not last visit on 9/25.  Continued and progressive weakness even since her appointment on 9/25.    PT Comments    Patient demonstrates much improvement in functional mobility and able to ambulate outside of room and tolerated sitting up in chair after therapy with family supervising.  Patient will benefit from continued physical therapy in hospital and recommended venue below to increase strength, balance, endurance for safe ADLs and gait.    Follow Up Recommendations  SNF;Supervision/Assistance - 24 hour     Equipment Recommendations  None recommended by PT    Recommendations for Other Services       Precautions / Restrictions Precautions Precautions: Fall Restrictions Weight Bearing Restrictions: No    Mobility  Bed Mobility Overal bed mobility: Needs Assistance Bed Mobility: Supine to Sit;Sit to Supine     Supine to sit: Min assist Sit to supine: Min assist   General bed mobility comments: Demonstrates improvement for using UE to sit up at bedside  Transfers Overall transfer level: Needs assistance Equipment used: Rolling walker (2 wheeled) Transfers: Sit to/from [employer] Sit to Stand: Min assist Stand pivot transfers: Min assist       General transfer comment: increased BLE strength for sit to stands and transfer to chair  Ambulation/Gait Ambulation/Gait assistance: Min assist Ambulation Distance (Feet): 25 Feet Assistive device: Rolling walker (2 wheeled) Gait Pattern/deviations: Decreased step length - right;Decreased step length - left;Decreased stride length Gait velocity: slow Gait velocity interpretation: Below normal speed for age/gender General Gait Details: Demonstrates increased endurance for gait with slow labored cadence without loss of balance, unsteady when making turns   Financial trader [place] (Stroke Patients Only)       Balance Overall balance assessment: Needs assistance Sitting-balance support: Feet supported;Bilateral upper extremity supported Sitting balance-Leahy Scale: Good     Standing balance support: Bilateral upper extremity supported;During functional activity Standing balance-Leahy Scale: Fair                              Cognition Arousal/Alertness: Awake/alert Behavior During Therapy: WFL for tasks assessed/performed Overall Cognitive Status: Within Functional Limits for tasks assessed                                        Exercises General Exercises - Lower Extremity Ankle Circles/Pumps: Seated;AROM;Both;Strengthening;10 reps Long Arc Quad: Seated;AROM;Strengthening;Both;10 reps Hip Flexion/Marching: Seated;AROM;Strengthening;Both;10 reps    General Comments  Pertinent Vitals/Pain Pain Assessment: Faces Pain Score: 4  Faces Pain Scale: Hurts little more Pain Location: headache Pain Descriptors / Indicators: Aching Pain Intervention(s): Limited activity within patient's tolerance;Monitored during session    Home Living                      Prior Function            PT Goals (current goals can  now be found in the care plan section) Acute Rehab PT Goals Patient Stated Goal: return home with family PT Goal Formulation: With patient/family Time For Goal Achievement: 03/04/17 Potential to Achieve Goals: Good Progress towards PT goals: Progressing toward goals    Frequency    Min 3X/week      PT Plan Current plan remains appropriate    Co-evaluation   Reason for Co-Treatment: Complexity of the patient's impairments (multi-system involvement);Necessary to address cognition/behavior during functional activity;To address functional/ADL transfers   OT goals addressed during session: ADL's and self-care      AM-PAC PT "6 Clicks" Daily Activity  Outcome Measure  Difficulty turning over in bed (including adjusting bedclothes, sheets and blankets)?: A Little Difficulty moving from lying on back to sitting on the side of the bed? : A Little Difficulty sitting down on and standing up from a chair with arms (e.g., wheelchair, bedside commode, etc,.)?: A Little Help needed moving to and from a bed to chair (including a wheelchair)?: A Little Help needed walking in hospital room?: A Lot Help needed climbing 3-5 steps with a railing? : A Lot 6 Click Score: 16    End of Session Equipment Utilized During Treatment: Gait belt Activity Tolerance: Patient tolerated treatment well;Patient limited by fatigue Patient left: in chair;with call bell/phone within reach;with family/visitor present Nurse Communication: Mobility status PT Visit Diagnosis: Unsteadiness on feet (R26.81);Other abnormalities of gait and mobility (R26.89);Muscle weakness (generalized) (M62.81)     Time: 1610-9604 PT Time Calculation (min) (ACUTE ONLY): 32 min  Charges:  $Therapeutic Activity: 23-37 mins                    G Codes:  Functional Assessment Tool Used: AM-PAC 6 Clicks Basic Mobility Functional Limitation: Mobility: Walking and moving around Mobility: Walking and Moving Around Current Status  (V4098): At least 40 percent but less than 60 percent impaired, limited or restricted Mobility: Walking and Moving Around Goal Status [phone]): At least 40 percent but less than 60 percent impaired, limited or restricted Mobility: Walking and Moving Around Discharge Status [phone]): At least 40 percent but less than 60 percent impaired, limited or restricted    1:14 PM, 02/20/17 Lonell Grandchild, MPT Physical Therapist with [facility] 336 [phone] office [phone] mobile phone

## 2017-02-20 NOTE — Discharge Planning (Signed)
IV removed.  Discharge papers given, explained and educated with patient and family.  RN assessment and VS revealed stability for DC to home.  Informed of suggested FU appt and appts made.  No scripts needed iat this time, but informed to stop taking lisinopril, until re-assesses with PCP. Imodium x 2 given prior to DC.  EMS contacted to transport - should arrive about 1500.  Husband in room till EMS arrives.

## 2017-02-20 NOTE — Progress Notes (Signed)
Occupational Therapy Treatment Patient Details Name: [name] MRN: [id number] DOB: Apr 22, 1941 Today's Date: 02/20/2017    History of present illness [name] is a 76 y.o. female with medical history significant of ongoing tobacco dependence; remote CVA; hypothyroidism; HTN: HLD; fibromyalgia; COPD on intermittent O2 presenting with weakness.  She was sitting in the recliner watching tv and ready to go to bed and tried to get up and was unable.  She fell back into the chair.  Also with abdominal pain.  She had been weak for days.  She has been drinking sodas but not eating well.  Abdominal pain for a couple of weeks but really bothering her today.  Occasional diarrhea but no n/v.  Possible subjective fever some nights.  Falling a lot at home.  BP medication changed recently but not last visit on 9/25.  Continued and progressive weakness even since her appointment on 9/25.   OT comments  Pt received supine in bed, family present and requesting information on pt condition and discharge planning. Pt agreeable to PT/OT treatment. Pt cognition much improved today, able to sit at EOB and assist with donning socks, following commands this session. At this time family is planning to take pt home and would like [facility] services instead of SNF. Pt will be discharged from OT services as she is planning to discharge this afternoon.    Follow Up Recommendations  Home health OT;Supervision/Assistance - 24 hour    Equipment Recommendations  Tub/shower seat       Precautions / Restrictions Precautions Precautions: Fall       Mobility Bed Mobility               General bed mobility comments: defer to PT  Transfers                 General transfer comment: defer to PT        ADL either performed or assessed with clinical judgement   ADL Overall ADL's : Needs assistance/impaired Eating/Feeding: Set up;Bed level                   Lower Body Dressing: Set  up;Minimal assistance;Sitting/lateral leans                                 Cognition Arousal/Alertness: Awake/alert Behavior During Therapy: WFL for tasks assessed/performed Overall Cognitive Status: Within Functional Limits for tasks assessed                                                     Pertinent Vitals/ Pain       Pain Assessment: No/denies pain         Frequency  Min 2X/week        Progress Toward Goals  OT Goals(current goals can now be found in the care plan section)  Progress towards OT goals: Progressing toward goals  Acute Rehab OT Goals Patient Stated Goal: none stated ADL Goals Pt Will Perform Eating: with min assist;sitting Pt Will Perform Grooming: with min assist;sitting Pt Will Transfer to Toilet: with mod assist;stand pivot transfer;regular height toilet;bedside commode Pt/caregiver will Perform Home Exercise Program: Increased strength;Both right and left upper extremity;With minimal assist;With written HEP provided  Plan Discharge plan needs to be updated  Co-evaluation    PT/OT/SLP Co-Evaluation/Treatment: Yes Reason for Co-Treatment: Complexity of the patient's impairments (multi-system involvement);Necessary to address cognition/behavior during functional activity;To address functional/ADL transfers   OT goals addressed during session: ADL's and self-care      AM-PAC PT "6 Clicks" Daily Activity     Outcome Measure   Help from another person eating meals?: A Little Help from another person taking care of personal grooming?: A Little Help from another person toileting, which includes using toliet, bedpan, or urinal?: A Lot Help from another person bathing (including washing, rinsing, drying)?: A Lot Help from another person to put on and taking off regular upper body clothing?: A Little Help from another person to put on and taking off regular lower body clothing?: A Lot 6 Click Score: 15    End  of Session Equipment Utilized During Treatment: Gait belt;Rolling walker  OT Visit Diagnosis: Muscle weakness (generalized) (M62.81)   Activity Tolerance Patient limited by lethargy   Patient Left in chair;with call bell/phone within reach;with nursing/sitter in room;with family/visitor present       Functional Assessment Tool Used: AM-PAC 6 Clicks Daily Activity Functional Limitation: Self care Self Care Current Status (B5208): At least 40 percent but less than 60 percent impaired, limited or restricted Self Care Goal Status (Y2233): At least 40 percent but less than 60 percent impaired, limited or restricted Self Care Discharge Status [phone]): At least 40 percent but less than 60 percent impaired, limited or restricted   Time: 0912-0931 OT Time Calculation (min): 19 min  Charges: OT G-codes **NOT FOR INPATIENT CLASS** Functional Assessment Tool Used: AM-PAC 6 Clicks Daily Activity Functional Limitation: Self care Self Care Current Status (S9753): At least 40 percent but less than 60 percent impaired, limited or restricted Self Care Goal Status (Y0511): At least 40 percent but less than 60 percent impaired, limited or restricted Self Care Discharge Status [phone]): At least 40 percent but less than 60 percent impaired, limited or restricted OT General Charges $OT Visit: 1 Visit OT Treatments $Self Care/Home Management : 8-22 mins   Guadelupe Sabin, OTR/L  [phone] 02/20/2017, 12:20 PM

## 2017-02-20 NOTE — Clinical Social Work Note (Signed)
Patient and family made the decision to discharge to her home with HHPT services. CM is involved in arranging these services for patient.  LCSW signing off.    Toshiye Kever, Clydene Pugh, LCSW

## 2017-02-20 NOTE — Discharge Summary (Addendum)
Physician Discharge Summary  [name] DQQ:[id number] DOB: 07-Jun-1940 DOA: 02/17/2017  PCP: Mikey Kirschner, MD  Admit date: 02/17/2017 Discharge date: 02/20/2017  Admitted From: HOME Disposition:  Home  Recommendations for Outpatient Follow-up:  1. Follow up with PCP in 1-2 weeks 2. Please obtain BMP/CBC in one week   Home Health: YES Equipment/Devices: HHPT  Discharge Condition: Stable CODE STATUS:DNR Diet recommendation: Heart Healthy   Brief/Interim Summary: 76 year old female with a history of stroke, COPD, tobacco abuse, cognitive impairment, essential hypertension, hypothyroidism, anxiety/depression presented with 2 week history of generalized weakness and failure to thrive. The patient's family stated that for 2 weeks she has had increasing difficulty making transfers and with ambulation. She has had decreased oral intake. In addition, the patient has had 2-3 loose stools daily without hematochezia or melena. There was no reports of fevers, chills, headache, chest pain, short of breath, nausea, vomiting, dysuria, hematuria. Because the patient had difficulty getting up from her recliner secondary to worsening weakness, EMS was activated. On trying to stand up and transfer to the stretcher, the patient has syncopal episode. She was noted initially to be hypotensive. She was fluid resuscitated with improvement of her blood pressure. Her lisinopril was discontinued. Initial orthostatic vital signs reveal orthostatic hypotension. This improved with fluid resuscitation. Urinalysis was negative for pyuria. PT was consulted and recommended skilled nursing facility. There was difficulty obtaining insurance approval. The patient was medically stabilized. The patient's family did not want to pay out of pocket to go to a skilled nursing facility until which time her insurance granted approval. As result, home physical therapy and social work set up.  Discharge Diagnoses:    Generalized weakness -Suspect to be multifactorial secondary to dehydration, poor oral intake, acute renal failure, orthostatic hypotension also age and possible diagnosis of dementia. -TSH  within normal limits ; B12 WNL. -Seen by PT who is recommending SNF, social work following. -Discontinue lisinopril-->BP improved  Hypotension -BP improved with cessation of lisinopril. -will not restart lisinopril -Lactic acid 1.0 -Blood cultures negative -Urinalysis negative for pyuria -Chest x-ray negative for infiltrates  AKI -Baseline creatinine 0.6-0.8 -Presenting creatinine 1.12 -Improved with IV fluids -Secondary to volume depletion  Left Hip Pain -Xray negative for fracture. -Severe bilateral hip OA.  Acute urinary retention -DC foley--patient was able to urinate spontaneously  Diarrhea -cdiff negative -GI pathogen panel negative -prn imodium -outpt GI referral if no improvement   Discharge Instructions  Discharge Instructions    Diet - low sodium heart healthy    Complete by:  As directed    Increase activity slowly    Complete by:  As directed      Allergies as of 02/20/2017      Reactions   Levaquin [levofloxacin In D5w] Swelling   To mouth   Macrobid [nitrofurantoin Macrocrystal] Other (See Comments)   Patient can't remember reaction.      Medication List    STOP taking these medications   lisinopril 5 MG tablet Commonly known as:  PRINIVIL,ZESTRIL     TAKE these medications   acetaminophen 325 MG tablet Commonly known as:  TYLENOL Take 2 tablets (650 mg total) by mouth every 6 (six) hours as needed for mild pain (or Fever >/= 101).   alendronate 70 MG tablet Commonly known as:  FOSAMAX TAKE 1 TABLET BY MOUTH ONCE A WEEK AS DIRECTED   ALPRAZolam 1 MG tablet Commonly known as:  XANAX TAKE 1 TABLET BY MOUTH AT BEDTIME AND 1/2 A TABLET DURING  THE DAY AS NEEDED   carbamide peroxide 6.5 % OTIC solution Commonly known as:  DEBROX Place 5 drops  into both ears daily as needed.   HYDROcodone-acetaminophen 7.5-325 MG tablet Commonly known as:  NORCO Take 1 tablet by mouth every 6 (six) hours as needed for moderate pain.   hydrOXYzine 25 MG tablet Commonly known as:  ATARAX/VISTARIL TAKE 1 TABLET BY MOUTH EVERY 6 HOURS AS NEEDED FOR ITCHING   levothyroxine 112 MCG tablet Commonly known as:  SYNTHROID, LEVOTHROID TAKE 1 TABLET BY MOUTH EVERY DAY   multivitamin with minerals Tabs tablet Take 1 tablet by mouth daily.   nortriptyline 50 MG capsule Commonly known as:  PAMELOR TAKE ONE CAPSULE BY MOUTH AT BEDTIME   pantoprazole 40 MG tablet Commonly known as:  PROTONIX TAKE 1 TABLET BY MOUTH EVERY DAY *STOP RANITIDINE*   PARoxetine 40 MG tablet Commonly known as:  PAXIL TAKE 1 TABLET BY MOUTH DAILY   pravastatin 20 MG tablet Commonly known as:  PRAVACHOL TAKE 1 TABLET BY MOUTH EVERY DAY       Allergies  Allergen Reactions  . Levaquin [Levofloxacin In D5w] Swelling    To mouth  . Macrobid [Nitrofurantoin Macrocrystal] Other (See Comments)    Patient can't remember reaction.    Consultations:  none   Procedures/Studies: Ct Head Wo Contrast  Result Date: 02/17/2017 CLINICAL DATA:  Syncope. EXAM: CT HEAD WITHOUT CONTRAST TECHNIQUE: Contiguous axial images were obtained from the base of the skull through the vertex without intravenous contrast. COMPARISON:  CT head dated July 10, 2016. FINDINGS: Brain: No evidence of acute infarction, hemorrhage, hydrocephalus, extra-axial collection or mass lesion/mass effect. Age-related cerebral atrophy with compensatory dilatation of the ventricles. Unchanged encephalomalacia at the left temporoparietal junction. Age-related cerebral atrophy with compensatory dilatation of the ventricles, similar to prior study. Mild periventricular white matter and corona radiata hypodensities favor chronic ischemic microvascular white matter disease. Vascular: Intracranial atherosclerotic  vascular calcifications. No hyperdense vessel. Skull: Normal. Negative for fracture or focal lesion. Sinuses/Orbits: The bilateral paranasal sinuses and mastoid air cells are clear. The orbits are unremarkable. Other: Small left parietal scalp hematoma. IMPRESSION: 1. No acute intracranial abnormality. Unchanged encephalomalacia at the left temporoparietal junction. 2. Small left parietal scalp hematoma. Electronically Signed   By: Titus Dubin M.D.   On: 02/17/2017 16:07   Dg Chest Port 1 View  Result Date: 02/17/2017 CLINICAL DATA:  Fever, syncope and weakness. EXAM: PORTABLE CHEST 1 VIEW COMPARISON:  04/24/2015 FINDINGS: The heart size and mediastinal contours are within normal limits. A aortic atherosclerosis involving the arch. No definite aneurysm. No pneumonic consolidation, effusion or overt pulmonary edema. Lung volumes are slightly low. Probable minimal atelectasis at the lung bases medially. The visualized skeletal structures are unremarkable. Surgical clips are seen in the right upper quadrant of the abdomen. IMPRESSION: 1. Low lung volumes with minimal bibasilar atelectasis. 2. No active cardiopulmonary disease. 3. Aortic atherosclerosis. Electronically Signed   By: Ashley Royalty M.D.   On: 02/17/2017 16:06   Dg Hip Unilat With Pelvis 2-3 Views Left  Result Date: 02/19/2017 CLINICAL DATA:  Left hip pain since a fall in a bathroom 02/16/2017. Initial encounter. EXAM: DG HIP (WITH OR WITHOUT PELVIS) 2-3V LEFT COMPARISON:  None. FINDINGS: No acute bony or joint abnormality is seen on the right or left. There is joint space narrowing of both hips with subchondral sclerosis and cyst formation on the right. No evidence of avascular necrosis of the femoral heads. Lower lumbar spondylosis is  noted. Atherosclerotic vascular disease is seen. IMPRESSION: No acute abnormality. Moderate to moderately severe bilateral hip osteoarthritis appears worse on the right. Atherosclerosis. Electronically Signed   By:  Inge Rise M.D.   On: 02/19/2017 15:23         Discharge Exam: Vitals:   02/19/17 2110 02/20/17 0939  BP: 129/70   Pulse: 83   Resp: 20   Temp: 99.2 F (37.3 C)   SpO2: 94% 96%   Vitals:   02/18/17 2041 02/19/17 1943 02/19/17 2110 02/20/17 0939  BP:   129/70   Pulse:   83   Resp:   20   Temp:   99.2 F (37.3 C)   TempSrc:   Oral   SpO2: 98% 92% 94% 96%  Weight:      Height:        General: Pt is alert, awake, not in acute distress Cardiovascular: RRR, S1/S2 +, no rubs, no gallops Respiratory: CTA bilaterally, no wheezing, no rhonchi Abdominal: Soft, NT, ND, bowel sounds + Extremities: no edema, no cyanosis   The results of significant diagnostics from this hospitalization (including imaging, microbiology, ancillary and laboratory) are listed below for reference.    Significant Diagnostic Studies: Ct Head Wo Contrast  Result Date: 02/17/2017 CLINICAL DATA:  Syncope. EXAM: CT HEAD WITHOUT CONTRAST TECHNIQUE: Contiguous axial images were obtained from the base of the skull through the vertex without intravenous contrast. COMPARISON:  CT head dated July 10, 2016. FINDINGS: Brain: No evidence of acute infarction, hemorrhage, hydrocephalus, extra-axial collection or mass lesion/mass effect. Age-related cerebral atrophy with compensatory dilatation of the ventricles. Unchanged encephalomalacia at the left temporoparietal junction. Age-related cerebral atrophy with compensatory dilatation of the ventricles, similar to prior study. Mild periventricular white matter and corona radiata hypodensities favor chronic ischemic microvascular white matter disease. Vascular: Intracranial atherosclerotic vascular calcifications. No hyperdense vessel. Skull: Normal. Negative for fracture or focal lesion. Sinuses/Orbits: The bilateral paranasal sinuses and mastoid air cells are clear. The orbits are unremarkable. Other: Small left parietal scalp hematoma. IMPRESSION: 1. No acute  intracranial abnormality. Unchanged encephalomalacia at the left temporoparietal junction. 2. Small left parietal scalp hematoma. Electronically Signed   By: Titus Dubin M.D.   On: 02/17/2017 16:07   Dg Chest Port 1 View  Result Date: 02/17/2017 CLINICAL DATA:  Fever, syncope and weakness. EXAM: PORTABLE CHEST 1 VIEW COMPARISON:  04/24/2015 FINDINGS: The heart size and mediastinal contours are within normal limits. A aortic atherosclerosis involving the arch. No definite aneurysm. No pneumonic consolidation, effusion or overt pulmonary edema. Lung volumes are slightly low. Probable minimal atelectasis at the lung bases medially. The visualized skeletal structures are unremarkable. Surgical clips are seen in the right upper quadrant of the abdomen. IMPRESSION: 1. Low lung volumes with minimal bibasilar atelectasis. 2. No active cardiopulmonary disease. 3. Aortic atherosclerosis. Electronically Signed   By: Ashley Royalty M.D.   On: 02/17/2017 16:06   Dg Hip Unilat With Pelvis 2-3 Views Left  Result Date: 02/19/2017 CLINICAL DATA:  Left hip pain since a fall in a bathroom 02/16/2017. Initial encounter. EXAM: DG HIP (WITH OR WITHOUT PELVIS) 2-3V LEFT COMPARISON:  None. FINDINGS: No acute bony or joint abnormality is seen on the right or left. There is joint space narrowing of both hips with subchondral sclerosis and cyst formation on the right. No evidence of avascular necrosis of the femoral heads. Lower lumbar spondylosis is noted. Atherosclerotic vascular disease is seen. IMPRESSION: No acute abnormality. Moderate to moderately severe bilateral hip osteoarthritis appears  worse on the right. Atherosclerosis. Electronically Signed   By: Inge Rise M.D.   On: 02/19/2017 15:23     Microbiology: Recent Results (from the past 240 hour(s))  Blood culture (routine x 2)     Status: None (Preliminary result)   Collection Time: 02/17/17  4:58 PM  Result Value Ref Range Status   Specimen Description  BLOOD LEFT ANTECUBITAL  Final   Special Requests   Final    BOTTLES DRAWN AEROBIC AND ANAEROBIC Blood Culture adequate volume   Culture NO GROWTH 3 DAYS  Final   Report Status PENDING  Incomplete  Blood culture (routine x 2)     Status: None (Preliminary result)   Collection Time: 02/17/17  4:59 PM  Result Value Ref Range Status   Specimen Description BLOOD LEFT HAND  Final   Special Requests   Final    BOTTLES DRAWN AEROBIC AND ANAEROBIC Blood Culture results may not be optimal due to an inadequate volume of blood received in culture bottles   Culture NO GROWTH 3 DAYS  Final   Report Status PENDING  Incomplete  Gastrointestinal Panel by PCR , Stool     Status: None   Collection Time: 02/17/17  7:00 PM  Result Value Ref Range Status   Campylobacter species NOT DETECTED NOT DETECTED Final   Plesimonas shigelloides NOT DETECTED NOT DETECTED Final   Salmonella species NOT DETECTED NOT DETECTED Final   Yersinia enterocolitica NOT DETECTED NOT DETECTED Final   Vibrio species NOT DETECTED NOT DETECTED Final   Vibrio cholerae NOT DETECTED NOT DETECTED Final   Enteroaggregative E coli (EAEC) NOT DETECTED NOT DETECTED Final   Enteropathogenic E coli (EPEC) NOT DETECTED NOT DETECTED Final   Enterotoxigenic E coli (ETEC) NOT DETECTED NOT DETECTED Final   Shiga like toxin producing E coli (STEC) NOT DETECTED NOT DETECTED Final   Shigella/Enteroinvasive E coli (EIEC) NOT DETECTED NOT DETECTED Final   Cryptosporidium NOT DETECTED NOT DETECTED Final   Cyclospora cayetanensis NOT DETECTED NOT DETECTED Final   Entamoeba histolytica NOT DETECTED NOT DETECTED Final   Giardia lamblia NOT DETECTED NOT DETECTED Final   Adenovirus F40/41 NOT DETECTED NOT DETECTED Final   Astrovirus NOT DETECTED NOT DETECTED Final   Norovirus GI/GII NOT DETECTED NOT DETECTED Final   Rotavirus A NOT DETECTED NOT DETECTED Final   Sapovirus (I, II, IV, and V) NOT DETECTED NOT DETECTED Final  C difficile quick scan w PCR  reflex     Status: None   Collection Time: 02/17/17  7:00 PM  Result Value Ref Range Status   C Diff antigen NEGATIVE NEGATIVE Final   C Diff toxin NEGATIVE NEGATIVE Final   C Diff interpretation No C. difficile detected.  Final     Labs: Basic Metabolic Panel:  Recent Labs Lab 02/17/17 1650 02/18/17 0558  NA 138 134*  K 4.3 3.6  CL 109 105  CO2 20* 18*  GLUCOSE 136* 114*  BUN 11 12  CREATININE 1.12* 0.91  CALCIUM 8.8* 8.2*   Liver Function Tests:  Recent Labs Lab 02/17/17 1650  AST 36  ALT 18  ALKPHOS 81  BILITOT 0.5  PROT 5.8*  ALBUMIN 2.9*   No results for input(s): LIPASE, AMYLASE in the last 168 hours. No results for input(s): AMMONIA in the last 168 hours. CBC:  Recent Labs Lab 02/17/17 1650 02/18/17 0558  WBC 10.0 9.1  NEUTROABS 8.8*  --   HGB 10.3* 9.9*  HCT 33.0* 30.9*  MCV 80.3 78.0  PLT 453* 439*   Cardiac Enzymes:  Recent Labs Lab 02/17/17 1848 02/18/17 0001 02/18/17 0558 02/18/17 1231  TROPONINI 0.03* <0.03 <0.03 <0.03   BNP: Invalid input(s): POCBNP CBG: No results for input(s): GLUCAP in the last 168 hours.  Time coordinating discharge:  Greater than 30 minutes  Signed:  Hanako Tipping, DO [facility] Pager: [phone] 02/20/2017, 10:07 AM

## 2017-02-20 NOTE — Clinical Social Work Note (Signed)
LCSW was notified by Cleon Dew, [facility], that the facility had obtained authorization for patient. LCSW spoke with patient's spouse, Mr. Runyon, and advised that patient had been authorized and that patient could go to facility. Mr. Steven indicated that they had made the decision to take patient home.    LCSW signing off.    Antavion Bartoszek, Clydene Pugh, LCSW

## 2017-02-21 ENCOUNTER — Telehealth: Payer: Self-pay

## 2017-02-21 NOTE — Telephone Encounter (Signed)
Call family , see how she is doing, make sure taking her meds,make sure gets appt within 7 days with our office for follow up. Spoke with pt husband he states pt is doing better and taking medications as instructed. Transferred husband to the front to schedule a follow up appointment.

## 2017-02-22 LAB — CULTURE, BLOOD (ROUTINE X 2)
CULTURE: NO GROWTH
CULTURE: NO GROWTH
Special Requests: ADEQUATE

## 2017-02-23 DIAGNOSIS — M25552 Pain in left hip: Secondary | ICD-10-CM | POA: Diagnosis not present

## 2017-02-23 DIAGNOSIS — J449 Chronic obstructive pulmonary disease, unspecified: Secondary | ICD-10-CM | POA: Diagnosis not present

## 2017-02-23 DIAGNOSIS — M6281 Muscle weakness (generalized): Secondary | ICD-10-CM | POA: Diagnosis not present

## 2017-02-23 DIAGNOSIS — I10 Essential (primary) hypertension: Secondary | ICD-10-CM | POA: Diagnosis not present

## 2017-02-23 DIAGNOSIS — E039 Hypothyroidism, unspecified: Secondary | ICD-10-CM | POA: Diagnosis not present

## 2017-02-23 DIAGNOSIS — G3184 Mild cognitive impairment, so stated: Secondary | ICD-10-CM | POA: Diagnosis not present

## 2017-02-23 DIAGNOSIS — R69 Illness, unspecified: Secondary | ICD-10-CM | POA: Diagnosis not present

## 2017-02-23 DIAGNOSIS — Z8673 Personal history of transient ischemic attack (TIA), and cerebral infarction without residual deficits: Secondary | ICD-10-CM | POA: Diagnosis not present

## 2017-02-23 DIAGNOSIS — R627 Adult failure to thrive: Secondary | ICD-10-CM | POA: Diagnosis not present

## 2017-02-25 DIAGNOSIS — E039 Hypothyroidism, unspecified: Secondary | ICD-10-CM | POA: Diagnosis not present

## 2017-02-25 DIAGNOSIS — Z8673 Personal history of transient ischemic attack (TIA), and cerebral infarction without residual deficits: Secondary | ICD-10-CM | POA: Diagnosis not present

## 2017-02-25 DIAGNOSIS — M25552 Pain in left hip: Secondary | ICD-10-CM | POA: Diagnosis not present

## 2017-02-25 DIAGNOSIS — I10 Essential (primary) hypertension: Secondary | ICD-10-CM | POA: Diagnosis not present

## 2017-02-25 DIAGNOSIS — G3184 Mild cognitive impairment, so stated: Secondary | ICD-10-CM | POA: Diagnosis not present

## 2017-02-25 DIAGNOSIS — J449 Chronic obstructive pulmonary disease, unspecified: Secondary | ICD-10-CM | POA: Diagnosis not present

## 2017-02-25 DIAGNOSIS — R627 Adult failure to thrive: Secondary | ICD-10-CM | POA: Diagnosis not present

## 2017-02-25 DIAGNOSIS — M6281 Muscle weakness (generalized): Secondary | ICD-10-CM | POA: Diagnosis not present

## 2017-02-25 DIAGNOSIS — R69 Illness, unspecified: Secondary | ICD-10-CM | POA: Diagnosis not present

## 2017-02-26 DIAGNOSIS — M6281 Muscle weakness (generalized): Secondary | ICD-10-CM | POA: Diagnosis not present

## 2017-02-26 DIAGNOSIS — R69 Illness, unspecified: Secondary | ICD-10-CM | POA: Diagnosis not present

## 2017-02-26 DIAGNOSIS — R627 Adult failure to thrive: Secondary | ICD-10-CM | POA: Diagnosis not present

## 2017-02-26 DIAGNOSIS — J449 Chronic obstructive pulmonary disease, unspecified: Secondary | ICD-10-CM | POA: Diagnosis not present

## 2017-02-26 DIAGNOSIS — G3184 Mild cognitive impairment, so stated: Secondary | ICD-10-CM | POA: Diagnosis not present

## 2017-02-26 DIAGNOSIS — I10 Essential (primary) hypertension: Secondary | ICD-10-CM | POA: Diagnosis not present

## 2017-02-26 DIAGNOSIS — Z8673 Personal history of transient ischemic attack (TIA), and cerebral infarction without residual deficits: Secondary | ICD-10-CM | POA: Diagnosis not present

## 2017-02-26 DIAGNOSIS — E039 Hypothyroidism, unspecified: Secondary | ICD-10-CM | POA: Diagnosis not present

## 2017-02-26 DIAGNOSIS — M25552 Pain in left hip: Secondary | ICD-10-CM | POA: Diagnosis not present

## 2017-02-27 DIAGNOSIS — R627 Adult failure to thrive: Secondary | ICD-10-CM | POA: Diagnosis not present

## 2017-02-27 DIAGNOSIS — M25552 Pain in left hip: Secondary | ICD-10-CM | POA: Diagnosis not present

## 2017-02-27 DIAGNOSIS — Z8673 Personal history of transient ischemic attack (TIA), and cerebral infarction without residual deficits: Secondary | ICD-10-CM | POA: Diagnosis not present

## 2017-02-27 DIAGNOSIS — G3184 Mild cognitive impairment, so stated: Secondary | ICD-10-CM | POA: Diagnosis not present

## 2017-02-27 DIAGNOSIS — I10 Essential (primary) hypertension: Secondary | ICD-10-CM | POA: Diagnosis not present

## 2017-02-27 DIAGNOSIS — J449 Chronic obstructive pulmonary disease, unspecified: Secondary | ICD-10-CM | POA: Diagnosis not present

## 2017-02-27 DIAGNOSIS — E039 Hypothyroidism, unspecified: Secondary | ICD-10-CM | POA: Diagnosis not present

## 2017-02-27 DIAGNOSIS — R69 Illness, unspecified: Secondary | ICD-10-CM | POA: Diagnosis not present

## 2017-02-27 DIAGNOSIS — M6281 Muscle weakness (generalized): Secondary | ICD-10-CM | POA: Diagnosis not present

## 2017-02-28 ENCOUNTER — Ambulatory Visit: Payer: Medicare HMO | Admitting: Family Medicine

## 2017-03-01 DIAGNOSIS — J449 Chronic obstructive pulmonary disease, unspecified: Secondary | ICD-10-CM | POA: Diagnosis not present

## 2017-03-01 DIAGNOSIS — E039 Hypothyroidism, unspecified: Secondary | ICD-10-CM | POA: Diagnosis not present

## 2017-03-01 DIAGNOSIS — R69 Illness, unspecified: Secondary | ICD-10-CM | POA: Diagnosis not present

## 2017-03-01 DIAGNOSIS — M6281 Muscle weakness (generalized): Secondary | ICD-10-CM | POA: Diagnosis not present

## 2017-03-01 DIAGNOSIS — R627 Adult failure to thrive: Secondary | ICD-10-CM | POA: Diagnosis not present

## 2017-03-01 DIAGNOSIS — I10 Essential (primary) hypertension: Secondary | ICD-10-CM | POA: Diagnosis not present

## 2017-03-01 DIAGNOSIS — M25552 Pain in left hip: Secondary | ICD-10-CM | POA: Diagnosis not present

## 2017-03-01 DIAGNOSIS — G3184 Mild cognitive impairment, so stated: Secondary | ICD-10-CM | POA: Diagnosis not present

## 2017-03-01 DIAGNOSIS — Z8673 Personal history of transient ischemic attack (TIA), and cerebral infarction without residual deficits: Secondary | ICD-10-CM | POA: Diagnosis not present

## 2017-03-04 ENCOUNTER — Ambulatory Visit: Payer: Medicare HMO | Admitting: Family Medicine

## 2017-03-05 ENCOUNTER — Ambulatory Visit (INDEPENDENT_AMBULATORY_CARE_PROVIDER_SITE_OTHER): Payer: Self-pay | Admitting: Internal Medicine

## 2017-03-05 ENCOUNTER — Ambulatory Visit (INDEPENDENT_AMBULATORY_CARE_PROVIDER_SITE_OTHER): Payer: Medicare HMO | Admitting: Internal Medicine

## 2017-03-05 ENCOUNTER — Encounter (INDEPENDENT_AMBULATORY_CARE_PROVIDER_SITE_OTHER): Payer: Self-pay | Admitting: Internal Medicine

## 2017-03-05 DIAGNOSIS — I10 Essential (primary) hypertension: Secondary | ICD-10-CM | POA: Diagnosis not present

## 2017-03-05 DIAGNOSIS — M6281 Muscle weakness (generalized): Secondary | ICD-10-CM | POA: Diagnosis not present

## 2017-03-05 DIAGNOSIS — R627 Adult failure to thrive: Secondary | ICD-10-CM | POA: Diagnosis not present

## 2017-03-05 DIAGNOSIS — R69 Illness, unspecified: Secondary | ICD-10-CM | POA: Diagnosis not present

## 2017-03-05 DIAGNOSIS — G3184 Mild cognitive impairment, so stated: Secondary | ICD-10-CM | POA: Diagnosis not present

## 2017-03-05 DIAGNOSIS — Z8673 Personal history of transient ischemic attack (TIA), and cerebral infarction without residual deficits: Secondary | ICD-10-CM | POA: Diagnosis not present

## 2017-03-05 DIAGNOSIS — M25552 Pain in left hip: Secondary | ICD-10-CM | POA: Diagnosis not present

## 2017-03-05 DIAGNOSIS — E039 Hypothyroidism, unspecified: Secondary | ICD-10-CM | POA: Diagnosis not present

## 2017-03-05 DIAGNOSIS — J449 Chronic obstructive pulmonary disease, unspecified: Secondary | ICD-10-CM | POA: Diagnosis not present

## 2017-03-08 DIAGNOSIS — R627 Adult failure to thrive: Secondary | ICD-10-CM | POA: Diagnosis not present

## 2017-03-08 DIAGNOSIS — I10 Essential (primary) hypertension: Secondary | ICD-10-CM | POA: Diagnosis not present

## 2017-03-08 DIAGNOSIS — Z8673 Personal history of transient ischemic attack (TIA), and cerebral infarction without residual deficits: Secondary | ICD-10-CM | POA: Diagnosis not present

## 2017-03-08 DIAGNOSIS — E039 Hypothyroidism, unspecified: Secondary | ICD-10-CM | POA: Diagnosis not present

## 2017-03-08 DIAGNOSIS — M25552 Pain in left hip: Secondary | ICD-10-CM | POA: Diagnosis not present

## 2017-03-08 DIAGNOSIS — R69 Illness, unspecified: Secondary | ICD-10-CM | POA: Diagnosis not present

## 2017-03-08 DIAGNOSIS — M6281 Muscle weakness (generalized): Secondary | ICD-10-CM | POA: Diagnosis not present

## 2017-03-08 DIAGNOSIS — J449 Chronic obstructive pulmonary disease, unspecified: Secondary | ICD-10-CM | POA: Diagnosis not present

## 2017-03-08 DIAGNOSIS — G3184 Mild cognitive impairment, so stated: Secondary | ICD-10-CM | POA: Diagnosis not present

## 2017-03-11 DIAGNOSIS — M6281 Muscle weakness (generalized): Secondary | ICD-10-CM | POA: Diagnosis not present

## 2017-03-11 DIAGNOSIS — J449 Chronic obstructive pulmonary disease, unspecified: Secondary | ICD-10-CM | POA: Diagnosis not present

## 2017-03-11 DIAGNOSIS — R627 Adult failure to thrive: Secondary | ICD-10-CM | POA: Diagnosis not present

## 2017-03-11 DIAGNOSIS — M25552 Pain in left hip: Secondary | ICD-10-CM | POA: Diagnosis not present

## 2017-03-12 ENCOUNTER — Encounter: Payer: Self-pay | Admitting: Family Medicine

## 2017-03-12 ENCOUNTER — Ambulatory Visit (INDEPENDENT_AMBULATORY_CARE_PROVIDER_SITE_OTHER): Payer: Medicare HMO | Admitting: Family Medicine

## 2017-03-12 VITALS — BP 128/70 | Ht 62.0 in

## 2017-03-12 DIAGNOSIS — I10 Essential (primary) hypertension: Secondary | ICD-10-CM

## 2017-03-12 NOTE — Progress Notes (Signed)
   Subjective:    Patient ID: [name], female    DOB: 05/24/1940, 76 y.o.   MRN: [id number]  HPI Patient is here today for a follow up on her HTN.She states she has been in the hospital since being here last for low BP and they called the ambulance. She was taken off her BP medications.  Blood pressure medicine and blood pressure levels reviewed today with patient. Compliant with blood pressure medicine. States does not miss a dose. No obvious side effects. Blood pressure generally good when checked elsewhere. Watching salt intake.   Patient's complete hospital record reviewed.  All tests reviewed.  All imaging reviewed.  All notes reviewed today in the presence of family   Review of Systems No headache, no major weight loss or weight gain, no chest pain no back pain abdominal pain no change in bowel habits complete ROS otherwise negative     Objective:   Physical Exam  Alert vitals stable, NAD. Blood pressure good on repeat. HEENT normal. Lungs clear. Heart regular rate and rhythm. Blood pressure excellent on repeat      Assessment & Plan:  Impression status post hospitalization low blood pressure.  Proper medicine use discussed.  Multiple questions answered.  Follow-up at regular appointment.  Maintain same dose and current dose of medications

## 2017-03-14 ENCOUNTER — Telehealth: Payer: Self-pay | Admitting: Family Medicine

## 2017-03-14 NOTE — Telephone Encounter (Signed)
Pt's husband called, pt is out of her Hydrocodone/APAP She has a Rx at the pharmacy, they need a call from [place] to OK a early refill, refill is 2 days early  Please advise & call when done    [facility]

## 2017-03-15 NOTE — Telephone Encounter (Signed)
May refill the medicine on 03/15/2017 which is 1 day early compared to what the drug registry states but in the future she needs to maintain every 30 days

## 2017-03-15 NOTE — Telephone Encounter (Signed)
I spoke with Felicia at [facility] and let her know it is ok to fill one day early. Spoke with the husband and he is aware she can have filled one day early this time,but needs to make sure she has the medication refilled every thirty days.

## 2017-03-18 ENCOUNTER — Telehealth: Payer: Self-pay

## 2017-03-18 MED ORDER — DOXYCYCLINE HYCLATE 100 MG PO TABS: 100.0000 mg | ORAL_TABLET | Freq: Two times a day (BID) | ORAL | 0 refills | Status: DC

## 2017-03-18 NOTE — Telephone Encounter (Signed)
Patient is aware we sent in the Rx to [facility].

## 2017-03-18 NOTE — Telephone Encounter (Signed)
Doxy 100 bid for seven d

## 2017-03-26 ENCOUNTER — Other Ambulatory Visit: Payer: Self-pay | Admitting: Family Medicine

## 2017-04-12 ENCOUNTER — Other Ambulatory Visit: Payer: Self-pay | Admitting: Family Medicine

## 2017-04-24 ENCOUNTER — Other Ambulatory Visit: Payer: Self-pay | Admitting: Family Medicine

## 2017-04-29 ENCOUNTER — Ambulatory Visit: Payer: Medicare HMO | Admitting: Family Medicine

## 2017-05-02 ENCOUNTER — Encounter: Payer: Self-pay | Admitting: Family Medicine

## 2017-05-02 ENCOUNTER — Ambulatory Visit (INDEPENDENT_AMBULATORY_CARE_PROVIDER_SITE_OTHER): Payer: Medicare HMO | Admitting: Family Medicine

## 2017-05-02 VITALS — BP 124/70 | Ht 62.0 in

## 2017-05-02 DIAGNOSIS — I10 Essential (primary) hypertension: Secondary | ICD-10-CM | POA: Diagnosis not present

## 2017-05-02 DIAGNOSIS — M7541 Impingement syndrome of right shoulder: Secondary | ICD-10-CM

## 2017-05-02 DIAGNOSIS — Z79899 Other long term (current) drug therapy: Secondary | ICD-10-CM | POA: Diagnosis not present

## 2017-05-02 DIAGNOSIS — Z13220 Encounter for screening for lipoid disorders: Secondary | ICD-10-CM

## 2017-05-02 MED ORDER — PANTOPRAZOLE SODIUM 40 MG PO TBEC: DELAYED_RELEASE_TABLET | ORAL | 1 refills | Status: DC

## 2017-05-02 MED ORDER — PAROXETINE HCL 40 MG PO TABS: 40.0000 mg | ORAL_TABLET | Freq: Every day | ORAL | 1 refills | Status: DC

## 2017-05-02 MED ORDER — PRAVASTATIN SODIUM 20 MG PO TABS: 20.0000 mg | ORAL_TABLET | Freq: Every day | ORAL | 1 refills | Status: DC

## 2017-05-02 MED ORDER — HYDROCODONE-ACETAMINOPHEN 7.5-325 MG PO TABS: 1.0000 | ORAL_TABLET | Freq: Four times a day (QID) | ORAL | 0 refills | Status: DC | PRN

## 2017-05-02 MED ORDER — LEVOTHYROXINE SODIUM 112 MCG PO TABS: 112.0000 ug | ORAL_TABLET | Freq: Every day | ORAL | 1 refills | Status: DC

## 2017-05-02 MED ORDER — ALENDRONATE SODIUM 70 MG PO TABS: ORAL_TABLET | ORAL | 0 refills | Status: DC

## 2017-05-02 MED ORDER — NORTRIPTYLINE HCL 50 MG PO CAPS: ORAL_CAPSULE | ORAL | 1 refills | Status: DC

## 2017-05-02 NOTE — Progress Notes (Signed)
   Subjective:    Patient ID: [name], female    DOB: 09/07/1940, 76 y.o.   MRN: [id number]  HPI This patient was seen today for chronic pain.  Takes for pain all over body.   The medication list was reviewed and updated.   -Compliance with medication: yes  - Number patient states they take daily: 3-4 a day  -when was the last dose patient took? today  The patient was advised the importance of maintaining medication and not using illegal substances with these.  Refills needed: yes  The patient was educated that we can provide 3 monthly scripts for their medication, it is their responsibility to follow the instructions.  Side effects or complications from medications: none  Patient is aware that pain medications are meant to minimize the severity of the pain to allow their pain levels to improve to allow for better function. They are aware of that pain medications cannot totally remove their pain.  Due for UDT ( at least once per year) : in wheelchair. Unable to get sample.   Fell 3 weeks ago. Still having pain on chin where she hit it.     Patient notes ongoing compliance with antidepressant medication. No obvious side effects. Reports does not miss a dose. Overall continues to help depression substantially. No thoughts of homicide or suicide. Would like to maintain medication.  Patient continues to take lipid medication regularly. No obvious side effects from it. Generally does not miss a dose. Prior blood work results are reviewed with patient. Patient continues to work on fat intake in diet     Review of Systems No headache, no major weight loss or weight gain, no chest pain no back pain abdominal pain no change in bowel habits complete ROS otherwise negative     Objective:   Physical Exam  Alert and oriented, vitals reviewed and stable, NAD ENT-TM's and ext canals WNL bilat via otoscopic exam Soft palate, tonsils and post pharynx WNL via oropharyngeal  exam Neck-symmetric, no masses; thyroid nonpalpable and nontender Pulmonary-no tachypnea or accessory muscle use; Clear without wheezes via auscultation Card--no abnrml murmurs, rhythm reg and rate WNL Carotid pulses symmetric, without bruits Impression 1      Assessment & Plan:  Hypertension.  Currently on no medications.  Blood pressure overall doing well  2.  Hyperlipidemia status uncertain we will check appropriate blood work  3.  Depression clinically stable  4.  Status post stroke stable per family  5.  Chronic pain  .pd  Impression: Chronic pain. Patient compliant with medication. No substantial side effects. [place] controlled substance registry reviewed to ensure compliance and proper use of medication. Patient aware goal of medicine is not complete resolution of pain but to control his symptoms to improve his functional capacity. Aware of potential adverse side effects  Follow-up in several months

## 2017-05-09 ENCOUNTER — Ambulatory Visit: Payer: Medicare HMO | Admitting: Family Medicine

## 2017-05-21 ENCOUNTER — Telehealth: Payer: Self-pay | Admitting: Family Medicine

## 2017-05-21 NOTE — Telephone Encounter (Signed)
Discussed with pt's husband. Offered appt today. He declined. He wanted to come tomorrow. Transferred to front to schedule office visit

## 2017-05-21 NOTE — Telephone Encounter (Signed)
ov first

## 2017-05-21 NOTE — Telephone Encounter (Signed)
Patient seen Dr. Richardson Landry on 05/02/17 for a fall she had a couple of months ago.  She has continued to have headaches and facial pain.  Her spouse is requesting order for x-ray to find out what is going on.

## 2017-05-21 NOTE — Telephone Encounter (Signed)
Please advise 

## 2017-05-22 ENCOUNTER — Ambulatory Visit: Payer: Medicare HMO | Admitting: Family Medicine

## 2017-05-23 ENCOUNTER — Other Ambulatory Visit: Payer: Self-pay | Admitting: Family Medicine

## 2017-05-27 ENCOUNTER — Encounter: Payer: Self-pay | Admitting: Family Medicine

## 2017-05-27 ENCOUNTER — Ambulatory Visit (INDEPENDENT_AMBULATORY_CARE_PROVIDER_SITE_OTHER): Payer: Medicare HMO | Admitting: Family Medicine

## 2017-05-27 VITALS — BP 110/78 | Ht 62.0 in

## 2017-05-27 DIAGNOSIS — L089 Local infection of the skin and subcutaneous tissue, unspecified: Secondary | ICD-10-CM

## 2017-05-27 DIAGNOSIS — K089 Disorder of teeth and supporting structures, unspecified: Secondary | ICD-10-CM | POA: Diagnosis not present

## 2017-05-27 DIAGNOSIS — R51 Headache: Secondary | ICD-10-CM

## 2017-05-27 DIAGNOSIS — B9689 Other specified bacterial agents as the cause of diseases classified elsewhere: Secondary | ICD-10-CM

## 2017-05-27 DIAGNOSIS — R519 Headache, unspecified: Secondary | ICD-10-CM

## 2017-05-27 MED ORDER — AMOXICILLIN-POT CLAVULANATE 875-125 MG PO TABS: 1.0000 | ORAL_TABLET | Freq: Two times a day (BID) | ORAL | 0 refills | Status: DC

## 2017-05-27 MED ORDER — CARBAMIDE PEROXIDE 6.5 % OT SOLN: 5.0000 [drp] | Freq: Every day | OTIC | 2 refills | Status: AC | PRN

## 2017-05-27 NOTE — Progress Notes (Signed)
   Subjective:    Patient ID: [name], female    DOB: 11-21-1940, 77 y.o.   MRN: [id number]  HPI  Patient is here today with complaints of facial pain and headache that she has had since she fell two months ago. She complains of right sided of face edema.  Patient complains of facial pain.  Family concerned about right-sided facial swelling.  Patient notes pain on chin.  Patient notes pain on lower gumline.  Communication challenge for this patient status post stroke.  Review of Systems No headache, no major weight loss or weight gain, no chest pain no back pain abdominal pain no change in bowel habits complete ROS otherwise negative     Objective:   Physical Exam Alert and oriented, vitals reviewed and stable, NAD ENT-TM's and ext canals WNL bilat via otoscopic exam Soft palate, tonsils and post pharynx WNL via oropharyngeal exam Neck-symmetric, no masses; thyroid nonpalpable and nontender Pulmonary-no tachypnea or accessory muscle use; Clear without wheezes via auscultation Card--no abnrml murmurs, rhythm reg and rate WNL Carotid pulses symmetric, without bruits Right lateral face ease increase fat pad compared to left.  Lower gumline several poor teeth eroded at the gum.  Chin resolving abrasion with slight pus and tenderness to palpation.       Assessment & Plan:  Impression very long discussion held.  I feel right sided facial puffiness is basically flat discussed  2.  Chin injury with residual skin structure infection discussed.  3.  Chronic dental challenges.  I think this patient will continue to experience lower jaw pain until she gets these teethout.  Name and number of oral surgeon given  Greater than 50% of this 25 minute face to face visit was spent in counseling and discussion and coordination of care regarding the above diagnosis/diagnosies

## 2017-05-31 ENCOUNTER — Telehealth: Payer: Self-pay | Admitting: Family Medicine

## 2017-05-31 MED ORDER — HYDROCODONE-ACETAMINOPHEN 7.5-325 MG PO TABS: 1.0000 | ORAL_TABLET | Freq: Four times a day (QID) | ORAL | 0 refills | Status: DC | PRN

## 2017-05-31 NOTE — Telephone Encounter (Signed)
Requesting refill for hydrocodone 

## 2017-05-31 NOTE — Telephone Encounter (Signed)
Patient got 3 scripts 05/02/17 per office notes-please advise

## 2017-05-31 NOTE — Telephone Encounter (Signed)
Can patient have a short script till other script can be filled 06/04/17-pharmacy will not fill it early.

## 2017-05-31 NOTE — Telephone Encounter (Signed)
Prescription up front for pick up. Husband notified.

## 2017-05-31 NOTE — Telephone Encounter (Signed)
Pt having odontogenic jaw pain in addtn to chronic pain so that's ok, l

## 2017-05-31 NOTE — Telephone Encounter (Signed)
Yes number 24

## 2017-05-31 NOTE — Telephone Encounter (Signed)
Patient still has the other scripts but it can not be filled until 06/04/17. Husband states the patient out of pills because when she cries and hollers in pain he gives her extra medicine

## 2017-05-31 NOTE — Telephone Encounter (Signed)
Call family and inquire

## 2017-06-03 ENCOUNTER — Telehealth: Payer: Self-pay | Admitting: Family Medicine

## 2017-06-03 MED ORDER — HYDROCODONE-ACETAMINOPHEN 10-325 MG PO TABS: ORAL_TABLET | ORAL | 0 refills | Status: DC

## 2017-06-03 NOTE — Telephone Encounter (Signed)
Ricky to bring by the old scripts and pick up the new increased strength. Awaiting Dr. Serena Croissant.

## 2017-06-03 NOTE — Telephone Encounter (Signed)
Ricky husband is aware.

## 2017-06-03 NOTE — Telephone Encounter (Signed)
Patients spouse picked up new Rx for hydrocodone on 05/31/17.  We had given this to her because the pharmacy wouldn't fill her other Rx early.  [name] said that the pharmacy told him that because this was wrote the exact same way that they still couldn't fill it and couldn't fill until he thinks the 26th.  He would like to know what he needs to do.

## 2017-06-03 NOTE — Telephone Encounter (Signed)
Per Dorothea Ogle at [facility]) he does not know who spoke with the [place], but he will run it through and If any problems will contact us. [name] (husband) is aware to take the rx back to the pharmacy.

## 2017-06-03 NOTE — Telephone Encounter (Signed)
Patients spouse said that the pharmacy will not fill the new Rx for hydrocodone until 06/08/17.  Please advise.  [facility]

## 2017-06-03 NOTE — Telephone Encounter (Signed)
Cancel all rx  incr strength to 10/325 write for numb 120. Plus one ref Let famioly know this is stronger dose and cannot exceed more than four per day--will not refill early in future

## 2017-06-05 ENCOUNTER — Other Ambulatory Visit: Payer: Self-pay | Admitting: *Deleted

## 2017-06-05 ENCOUNTER — Telehealth: Payer: Self-pay | Admitting: *Deleted

## 2017-06-05 MED ORDER — ALPRAZOLAM 1 MG PO TABS: ORAL_TABLET | ORAL | 5 refills | Status: DC

## 2017-06-05 MED ORDER — HYDROCODONE-ACETAMINOPHEN 10-325 MG PO TABS: ORAL_TABLET | ORAL | 0 refills | Status: DC

## 2017-06-05 NOTE — Telephone Encounter (Signed)
I called and asked [name] to have her husband call us when he gets in.

## 2017-06-05 NOTE — Telephone Encounter (Signed)
Pt's husband in office today at his appt. States Griselda was confused yesterday.he thinks it may be from the antibiotic she is taking augmentin. Did not take antibiotic yesterday and was better today.   Needs refill on xanax. Husband states since her infection she took more xanax than prescribed and he out now.   [facility]

## 2017-06-05 NOTE — Telephone Encounter (Signed)
Pt husband is aware of all,but questioning whether or not the antibx was causing confusion.Please advise.

## 2017-06-05 NOTE — Telephone Encounter (Signed)
No antibiotic not causing confusion

## 2017-06-05 NOTE — Telephone Encounter (Signed)
Pt's husband [name] returned two scripts of hydrocodone 7.5-325mg  and picked up two scripts for hydrodocodone 10/325 per dr Richardson Landry.

## 2017-06-05 NOTE — Telephone Encounter (Signed)
Pt's husband notified that antibiotic was not causing confusion and to finsih the antibiotic. Pt was better today. Not having confusion. Advised to call back if worse.

## 2017-06-05 NOTE — Telephone Encounter (Signed)
Rx written. Tell husband cannot exceed recommended dose, very important, if pharmacy does not honor moving it up at the same dose (thee way they did with her pain med when the family adjusted dpose on their own) I will not increase this

## 2017-07-07 ENCOUNTER — Emergency Department (HOSPITAL_COMMUNITY): Payer: Medicare HMO

## 2017-07-07 ENCOUNTER — Inpatient Hospital Stay (HOSPITAL_COMMUNITY)
Admission: EM | Admit: 2017-07-07 | Discharge: 2017-07-10 | DRG: 071 | Disposition: A | Discharge status: Home Health | Payer: Medicare HMO | Attending: Internal Medicine | Admitting: Internal Medicine

## 2017-07-07 ENCOUNTER — Other Ambulatory Visit: Payer: Self-pay

## 2017-07-07 ENCOUNTER — Encounter (HOSPITAL_COMMUNITY): Payer: Self-pay | Admitting: Emergency Medicine

## 2017-07-07 DIAGNOSIS — I69320 Aphasia following cerebral infarction: Secondary | ICD-10-CM

## 2017-07-07 DIAGNOSIS — Z7983 Long term (current) use of bisphosphonates: Secondary | ICD-10-CM

## 2017-07-07 DIAGNOSIS — G9341 Metabolic encephalopathy: Secondary | ICD-10-CM | POA: Diagnosis not present

## 2017-07-07 DIAGNOSIS — F05 Delirium due to known physiological condition: Secondary | ICD-10-CM | POA: Diagnosis present

## 2017-07-07 DIAGNOSIS — R0902 Hypoxemia: Secondary | ICD-10-CM

## 2017-07-07 DIAGNOSIS — E785 Hyperlipidemia, unspecified: Secondary | ICD-10-CM | POA: Diagnosis present

## 2017-07-07 DIAGNOSIS — K219 Gastro-esophageal reflux disease without esophagitis: Secondary | ICD-10-CM | POA: Diagnosis present

## 2017-07-07 DIAGNOSIS — E538 Deficiency of other specified B group vitamins: Secondary | ICD-10-CM | POA: Diagnosis present

## 2017-07-07 DIAGNOSIS — R443 Hallucinations, unspecified: Secondary | ICD-10-CM

## 2017-07-07 DIAGNOSIS — F0151 Vascular dementia with behavioral disturbance: Secondary | ICD-10-CM | POA: Diagnosis present

## 2017-07-07 DIAGNOSIS — R030 Elevated blood-pressure reading, without diagnosis of hypertension: Secondary | ICD-10-CM | POA: Diagnosis not present

## 2017-07-07 DIAGNOSIS — I10 Essential (primary) hypertension: Secondary | ICD-10-CM | POA: Diagnosis not present

## 2017-07-07 DIAGNOSIS — Z9884 Bariatric surgery status: Secondary | ICD-10-CM | POA: Diagnosis not present

## 2017-07-07 DIAGNOSIS — Z66 Do not resuscitate: Secondary | ICD-10-CM | POA: Diagnosis present

## 2017-07-07 DIAGNOSIS — Z62819 Personal history of unspecified abuse in childhood: Secondary | ICD-10-CM | POA: Diagnosis not present

## 2017-07-07 DIAGNOSIS — Z8659 Personal history of other mental and behavioral disorders: Secondary | ICD-10-CM | POA: Diagnosis not present

## 2017-07-07 DIAGNOSIS — R4182 Altered mental status, unspecified: Secondary | ICD-10-CM | POA: Diagnosis not present

## 2017-07-07 DIAGNOSIS — R627 Adult failure to thrive: Secondary | ICD-10-CM

## 2017-07-07 DIAGNOSIS — D518 Other vitamin B12 deficiency anemias: Secondary | ICD-10-CM | POA: Diagnosis not present

## 2017-07-07 DIAGNOSIS — G9340 Encephalopathy, unspecified: Secondary | ICD-10-CM | POA: Diagnosis not present

## 2017-07-07 DIAGNOSIS — Z881 Allergy status to other antibiotic agents status: Secondary | ICD-10-CM | POA: Diagnosis not present

## 2017-07-07 DIAGNOSIS — M797 Fibromyalgia: Secondary | ICD-10-CM | POA: Diagnosis present

## 2017-07-07 DIAGNOSIS — F0150 Vascular dementia without behavioral disturbance: Secondary | ICD-10-CM | POA: Diagnosis present

## 2017-07-07 DIAGNOSIS — R441 Visual hallucinations: Secondary | ICD-10-CM | POA: Diagnosis not present

## 2017-07-07 DIAGNOSIS — F419 Anxiety disorder, unspecified: Secondary | ICD-10-CM | POA: Diagnosis present

## 2017-07-07 DIAGNOSIS — E860 Dehydration: Secondary | ICD-10-CM | POA: Diagnosis present

## 2017-07-07 DIAGNOSIS — F0390 Unspecified dementia without behavioral disturbance: Secondary | ICD-10-CM | POA: Diagnosis present

## 2017-07-07 DIAGNOSIS — J449 Chronic obstructive pulmonary disease, unspecified: Secondary | ICD-10-CM | POA: Diagnosis present

## 2017-07-07 DIAGNOSIS — M810 Age-related osteoporosis without current pathological fracture: Secondary | ICD-10-CM | POA: Diagnosis present

## 2017-07-07 DIAGNOSIS — Z888 Allergy status to other drugs, medicaments and biological substances status: Secondary | ICD-10-CM | POA: Diagnosis not present

## 2017-07-07 DIAGNOSIS — R410 Disorientation, unspecified: Secondary | ICD-10-CM

## 2017-07-07 DIAGNOSIS — E039 Hypothyroidism, unspecified: Secondary | ICD-10-CM | POA: Diagnosis present

## 2017-07-07 DIAGNOSIS — R69 Illness, unspecified: Secondary | ICD-10-CM | POA: Diagnosis not present

## 2017-07-07 DIAGNOSIS — Z79890 Hormone replacement therapy (postmenopausal): Secondary | ICD-10-CM

## 2017-07-07 DIAGNOSIS — R442 Other hallucinations: Secondary | ICD-10-CM | POA: Diagnosis not present

## 2017-07-07 DIAGNOSIS — F17210 Nicotine dependence, cigarettes, uncomplicated: Secondary | ICD-10-CM | POA: Diagnosis present

## 2017-07-07 DIAGNOSIS — Z79899 Other long term (current) drug therapy: Secondary | ICD-10-CM

## 2017-07-07 HISTORY — DX: Other symptoms and signs involving cognitive functions and awareness: R41.89

## 2017-07-07 LAB — CBC WITH DIFFERENTIAL/PLATELET
Basophils Absolute: 0.1 10*3/uL (ref 0.0–0.1)
Basophils Relative: 1 %
EOS ABS: 0 10*3/uL (ref 0.0–0.7)
EOS PCT: 0 %
HCT: 34.3 % — ABNORMAL LOW (ref 36.0–46.0)
Hemoglobin: 10.4 g/dL — ABNORMAL LOW (ref 12.0–15.0)
LYMPHS ABS: 2.2 10*3/uL (ref 0.7–4.0)
Lymphocytes Relative: 26 %
MCH: 27.2 pg (ref 26.0–34.0)
MCHC: 30.3 g/dL (ref 30.0–36.0)
MCV: 89.8 fL (ref 78.0–100.0)
Monocytes Absolute: 0.8 10*3/uL (ref 0.1–1.0)
Monocytes Relative: 9 %
Neutro Abs: 5.2 10*3/uL (ref 1.7–7.7)
Neutrophils Relative %: 64 %
PLATELETS: 392 10*3/uL (ref 150–400)
RBC: 3.82 MIL/uL — AB (ref 3.87–5.11)
RDW: 19.6 % — ABNORMAL HIGH (ref 11.5–15.5)
WBC: 8.3 10*3/uL (ref 4.0–10.5)

## 2017-07-07 LAB — URINALYSIS, ROUTINE W REFLEX MICROSCOPIC
BILIRUBIN URINE: NEGATIVE
GLUCOSE, UA: NEGATIVE mg/dL
HGB URINE DIPSTICK: NEGATIVE
KETONES UR: 5 mg/dL — AB
LEUKOCYTES UA: NEGATIVE
Nitrite: NEGATIVE
PH: 6 (ref 5.0–8.0)
Protein, ur: NEGATIVE mg/dL
Specific Gravity, Urine: 1.009 (ref 1.005–1.030)

## 2017-07-07 LAB — COMPREHENSIVE METABOLIC PANEL
ALT: 12 U/L — ABNORMAL LOW (ref 14–54)
AST: 23 U/L (ref 15–41)
Albumin: 4 g/dL (ref 3.5–5.0)
Alkaline Phosphatase: 81 U/L (ref 38–126)
Anion gap: 15 (ref 5–15)
BILIRUBIN TOTAL: 0.5 mg/dL (ref 0.3–1.2)
BUN: 12 mg/dL (ref 6–20)
CO2: 18 mmol/L — ABNORMAL LOW (ref 22–32)
Calcium: 9.5 mg/dL (ref 8.9–10.3)
Chloride: 106 mmol/L (ref 101–111)
Creatinine, Ser: 0.54 mg/dL (ref 0.44–1.00)
Glucose, Bld: 108 mg/dL — ABNORMAL HIGH (ref 65–99)
POTASSIUM: 4.1 mmol/L (ref 3.5–5.1)
Sodium: 139 mmol/L (ref 135–145)
TOTAL PROTEIN: 7.6 g/dL (ref 6.5–8.1)

## 2017-07-07 LAB — RAPID URINE DRUG SCREEN, HOSP PERFORMED
Amphetamines: NOT DETECTED
Barbiturates: NOT DETECTED
Benzodiazepines: POSITIVE — AB
Cocaine: NOT DETECTED
OPIATES: POSITIVE — AB
Tetrahydrocannabinol: NOT DETECTED

## 2017-07-07 LAB — AMMONIA: Ammonia: 20 umol/L (ref 9–35)

## 2017-07-07 LAB — ETHANOL

## 2017-07-07 LAB — TROPONIN I

## 2017-07-07 LAB — MAGNESIUM: MAGNESIUM: 2 mg/dL (ref 1.7–2.4)

## 2017-07-07 LAB — ACETAMINOPHEN LEVEL: Acetaminophen (Tylenol), Serum: <10 ug/mL — ABNORMAL LOW (ref 10–30)

## 2017-07-07 LAB — TSH: TSH: 4.147 u[IU]/mL (ref 0.350–4.500)

## 2017-07-07 LAB — BRAIN NATRIURETIC PEPTIDE: B Natriuretic Peptide: 126 pg/mL — ABNORMAL HIGH (ref 0.0–100.0)

## 2017-07-07 LAB — LACTIC ACID, PLASMA
Lactic Acid, Venous: 1.9 mmol/L (ref 0.5–1.9)
Lactic Acid, Venous: 1.6 mmol/L (ref 0.5–1.9)

## 2017-07-07 LAB — SALICYLATE LEVEL

## 2017-07-07 MED ORDER — CARBAMIDE PEROXIDE 6.5 % OT SOLN
5.0000 [drp] | Freq: Two times a day (BID) | OTIC | Status: DC
  Administered 2017-07-08 – 2017-07-10 (×5): 5 [drp] via OTIC
  Filled 2017-07-07: qty 15

## 2017-07-07 MED ORDER — ONDANSETRON HCL 4 MG/2ML IJ SOLN
4.0000 mg | Freq: Four times a day (QID) | INTRAMUSCULAR | Status: DC | PRN
Start: 2017-07-07 — End: 2017-07-10

## 2017-07-07 MED ORDER — ACETAMINOPHEN 325 MG PO TABS
650.0000 mg | ORAL_TABLET | Freq: Four times a day (QID) | ORAL | Status: DC | PRN
  Administered 2017-07-08 – 2017-07-10 (×6): 650 mg via ORAL
  Filled 2017-07-07 (×6): qty 2

## 2017-07-07 MED ORDER — IPRATROPIUM-ALBUTEROL 0.5-2.5 (3) MG/3ML IN SOLN
3.0000 mL | Freq: Once | RESPIRATORY_TRACT | Status: AC
  Administered 2017-07-07: 3 mL via RESPIRATORY_TRACT
  Filled 2017-07-07: qty 3

## 2017-07-07 MED ORDER — PANTOPRAZOLE SODIUM 40 MG PO TBEC
40.0000 mg | DELAYED_RELEASE_TABLET | Freq: Every day | ORAL | Status: DC
  Administered 2017-07-08 – 2017-07-10 (×3): 40 mg via ORAL
  Filled 2017-07-07 (×4): qty 1

## 2017-07-07 MED ORDER — LORAZEPAM 2 MG/ML IJ SOLN
0.5000 mg | Freq: Once | INTRAMUSCULAR | Status: AC
  Administered 2017-07-07: 0.5 mg via INTRAVENOUS
  Filled 2017-07-07: qty 1

## 2017-07-07 MED ORDER — HYDRALAZINE HCL 20 MG/ML IJ SOLN: 10.0000 mg | Freq: Three times a day (TID) | INTRAMUSCULAR | Status: DC | PRN

## 2017-07-07 MED ORDER — LORAZEPAM 2 MG/ML IJ SOLN: 1.0000 mg | Freq: Once | INTRAMUSCULAR | Status: DC

## 2017-07-07 MED ORDER — ENOXAPARIN SODIUM 40 MG/0.4ML ~~LOC~~ SOLN
40.0000 mg | SUBCUTANEOUS | Status: DC
  Administered 2017-07-07 – 2017-07-09 (×3): 40 mg via SUBCUTANEOUS
  Filled 2017-07-07 (×3): qty 0.4

## 2017-07-07 MED ORDER — NORTRIPTYLINE HCL 25 MG PO CAPS
50.0000 mg | ORAL_CAPSULE | Freq: Every day | ORAL | Status: DC
  Administered 2017-07-08 – 2017-07-09 (×2): 50 mg via ORAL
  Filled 2017-07-07 (×2): qty 2

## 2017-07-07 MED ORDER — ONDANSETRON HCL 4 MG PO TABS: 4.0000 mg | ORAL_TABLET | Freq: Four times a day (QID) | ORAL | Status: DC | PRN

## 2017-07-07 MED ORDER — PRAVASTATIN SODIUM 10 MG PO TABS
20.0000 mg | ORAL_TABLET | Freq: Every day | ORAL | Status: DC
  Administered 2017-07-09: 20 mg via ORAL
  Filled 2017-07-07: qty 2

## 2017-07-07 MED ORDER — LEVOTHYROXINE SODIUM 112 MCG PO TABS
112.0000 ug | ORAL_TABLET | Freq: Every day | ORAL | Status: DC
  Administered 2017-07-08 – 2017-07-10 (×3): 112 ug via ORAL
  Filled 2017-07-07 (×3): qty 1

## 2017-07-07 MED ORDER — ACETAMINOPHEN 650 MG RE SUPP: 650.0000 mg | Freq: Four times a day (QID) | RECTAL | Status: DC | PRN

## 2017-07-07 NOTE — ED Triage Notes (Signed)
Pt brought in by [facility] for altered mental status. Pt per neighbor who is here with another pt is always confused and has been confused for years  Pt is also on home O2, and was brought in by EMS without O2  She resides at home with her husband and is increasingly confused

## 2017-07-07 NOTE — ED Provider Notes (Signed)
[facility] EMERGENCY DEPARTMENT Provider Note   CSN: [id number] Arrival date & time: 07/07/17  1240     History   Chief Complaint Chief Complaint  Patient presents with  . Altered Mental Status    HPI [name] is a 77 y.o. female.  The history is provided by a relative and the spouse. The history is limited by the condition of the patient (Hx confusion).  Altered Mental Status      Pt was seen at 1300. Per pt's spouse and family: Pt with increasing confusion from baseline confusion for the past 3 days. Family states she has been "seeing people in the yard when there weren't any" and "saying people are on the phone when they're not." Pt apparently spent all night on the telephone with no one on the other end. Pt has been refusing to eat/drink or take her meds. Pt's son states the last time she had these symptoms she "had a urine infection." Pt herself keeps repeating "tell them to come in."   Past Medical History:  Diagnosis Date  . Anxiety   . Aphasia due to stroke   . Apraxia due to stroke   . Cognitive impairment   . COPD (chronic obstructive pulmonary disease) ([place])    NOT on home O2  . Fibromyalgia   . Gastritis   . GERD (gastroesophageal reflux disease)   . Headache(784.0)   . Hyperlipidemia   . Hypertension   . Hypothyroidism   . Lumbar spondylosis 01/23/2011  . Osteoporosis   . Stroke ([place])   . Tobacco abuse    ongoing    Patient Active Problem List   Diagnosis Date Noted  . Left hip pain   . Syncope   . Palliative care encounter   . Goals of care, counseling/discussion   . Weakness 02/17/2017  . Vascular dementia 02/17/2017  . Seborrheic keratoses 01/14/2016  . Urge incontinence of urine 03/15/2015  . Colitis   . Diarrhea 04/26/2014  . Hypotension 04/26/2014  . AKI (acute kidney injury) ([place]) 04/26/2014  . SIRS (systemic inflammatory response syndrome) ([place]) 04/26/2014  . Anemia of chronic disease   . Dehydration   . Anemia 03/30/2014    . Chest pain 03/29/2014  . Aphasia due to stroke 03/29/2014  . Apraxia due to stroke 03/29/2014  . Tobacco abuse 03/29/2014  . Depression 03/29/2014  . Hypothyroidism 04/19/2013  . Cerebral artery occlusion with cerebral infarction ([place]) 09/02/2012  . Headache(784.0) 09/02/2012  . Other and unspecified hyperlipidemia 08/13/2012  . Aphasia due to recent cerebral infarction 01/03/2012  . Lumbar spondylosis 01/23/2011  . Hypokalemia 01/22/2011  . Abdominal lipoma 01/22/2011  . Bronchitis 01/22/2011  . HTN (hypertension), malignant 01/21/2011  . Sinusitis 01/21/2011  . Tinnitus 01/21/2011  . Delirium 01/20/2011  . UTI (urinary tract infection) 01/20/2011  . Anxiety 01/20/2011  . Chronic abdominal pain 01/20/2011  . Fibromyalgia 01/20/2011  . GERD (gastroesophageal reflux disease) 01/20/2011    Past Surgical History:  Procedure Laterality Date  . CHOLECYSTECTOMY    . COLONOSCOPY WITH ESOPHAGOGASTRODUODENOSCOPY (EGD)  2006   Dr. Laural Golden: normal upper endoscopy with surgically alterated stomach, normal colon but redundant  . ESOPHAGOGASTRODUODENOSCOPY  04/06/2014   Dr.Rourk: abnormal esophagus  . GASTRIC BYPASS     open  . KNEE SURGERY Left     OB History    Gravida Para Term Preterm AB Living   2 2 2     2    SAB TAB Ectopic Multiple Live Births  Home Medications    Prior to Admission medications   Medication Sig Start Date End Date Taking? Authorizing Provider  acetaminophen (TYLENOL) 325 MG tablet Take 2 tablets (650 mg total) by mouth every 6 (six) hours as needed for mild pain (or Fever >/= 101). 04/30/14   Velvet Bathe, MD  alendronate (FOSAMAX) 70 MG tablet TAKE 1 TABLET BY MOUTH ONCE A WEEK AS DIRECTED 05/02/17   Mikey Kirschner, MD  ALPRAZolam Duanne Moron) 1 MG tablet TAKE 1 TABLET BY MOUTH AT BEDTIME AND 1/2 A TABLET DURING THE DAY AS NEEDED 06/05/17   Mikey Kirschner, MD  amoxicillin-clavulanate (AUGMENTIN) 875-125 MG tablet Take 1 tablet by  mouth 2 (two) times daily. 05/27/17   Mikey Kirschner, MD  carbamide peroxide (DEBROX) 6.5 % OTIC solution Place 5 drops into both ears daily as needed. 05/27/17   Mikey Kirschner, MD  HYDROcodone-acetaminophen [facility]) 10-325 MG tablet One po Q 6 hours prn 06/05/17   Mikey Kirschner, MD  hydrOXYzine (ATARAX/VISTARIL) 25 MG tablet TAKE 1 TABLET BY MOUTH EVERY 6 HOURS AS NEEDED FOR ITCHING 10/16/16   Mikey Kirschner, MD  levothyroxine (SYNTHROID, LEVOTHROID) 112 MCG tablet Take 1 tablet (112 mcg total) by mouth daily. 05/02/17   Mikey Kirschner, MD  Multiple Vitamin (MULTIVITAMIN WITH MINERALS) TABS Take 1 tablet by mouth daily.    [provider]  nortriptyline (PAMELOR) 50 MG capsule TAKE 1 CAPSULE BY MOUTH EVERYDAY AT BEDTIME 05/02/17   Mikey Kirschner, MD  pantoprazole (PROTONIX) 40 MG tablet TAKE 1 TABLET BY MOUTH EVERY DAY *STOP RANITIDINE* 05/02/17   Mikey Kirschner, MD  PARoxetine (PAXIL) 40 MG tablet Take 1 tablet (40 mg total) by mouth daily. 05/02/17   Mikey Kirschner, MD  pravastatin (PRAVACHOL) 20 MG tablet Take 1 tablet (20 mg total) by mouth daily. 05/02/17   Mikey Kirschner, MD    Family History Family History  Problem Relation Age of Onset  . Heart attack Father   . Colon cancer Brother     Social History Social History   Tobacco Use  . Smoking status: Light Tobacco Smoker    Packs/day: 0.33    Years: 64.00    Pack years: 21.12    Types: Cigarettes  . Smokeless tobacco: Never Used  Substance Use Topics  . Alcohol use: No    Alcohol/week: 0.0 oz  . Drug use: No     Allergies   Levaquin [levofloxacin in d5w] and Macrobid [nitrofurantoin macrocrystal]   Review of Systems Review of Systems  Unable to perform ROS: Mental status change     Physical Exam Updated Vital Signs BP (!) 228/117 (BP Location: Right Arm)   Pulse 94   Temp (!) 97 F (36.1 C) (Oral)   Resp 20   Ht 5\' 2"  (1.575 m)   Wt 61.2 kg (135 lb)   SpO2 (!) 88% Comment:  pt on O2 at home  BMI 24.69 kg/m    Patient Vitals for the past 24 hrs:  BP Temp Temp src Pulse Resp SpO2 Height Weight  07/07/17 1614 (!) 187/76 - - 97 16 96 % - -  07/07/17 1600 - - - - 19 - - -  07/07/17 1518 - - - - - 94 % - -  07/07/17 1345 - - - 84 17 97 % - -  07/07/17 1255 - - - - - - 5\' 2"  (1.575 m) 61.2 kg (135 lb)  07/07/17 1253 (!) 228/117 Marland Kitchen)  97 F (36.1 C) Oral 94 20 (!) 88 % - -     15:41 Orthostatic Vital Signs EW  Orthostatic Lying   BP- Lying:  209/80  Pulse- Lying: 93      Orthostatic Sitting  BP- Sitting:  194/97  Pulse- Sitting: 94      Orthostatic Standing at 0 minutes  BP- Standing at 0 minutes:  219/87 (patient unable to stand durning this set taken except for approx. 5 seconds.)  Pulse- Standing at 0 minutes: 94     Physical Exam 1305: Physical examination:  Nursing notes reviewed; Vital signs and O2 SAT reviewed;  Constitutional: Well developed, Well nourished, In no acute distress; Head:  Normocephalic, atraumatic; Eyes: EOMI, PERRL, No scleral icterus; ENMT: Edentulous. Mouth and pharynx normal, Mucous membranes dry; Neck: Supple, Full range of motion, No lymphadenopathy; Cardiovascular: Regular rate and rhythm, No gallop; Respiratory: Breath sounds clear & equal bilaterally, No wheezes.  Speaking full sentences with ease, Normal respiratory effort/excursion; Chest: Nontender, Movement normal; Abdomen: Soft, Nontender, Nondistended, Normal bowel sounds; Genitourinary: No CVA tenderness; Extremities: Pulses normal, No deformity. +1 pedal edema bilat..; Neuro: Awake, alert, confused per hx. No facial droop. Speech clear. Moves all extremities on stretcher spontaneously without apparent gross focal motor deficits. +near constantly rubbing her gums with her fingers throughout my exam..; Skin: Color normal, Warm, Dry.   ED Treatments / Results  Labs (all labs ordered are listed, but only abnormal results are displayed)   EKG  EKG  Interpretation  Date/Time:  Sunday July 07 2017 13:30:58 EST Ventricular Rate:  89 PR Interval:    QRS Duration: 134 QT Interval:  414 QTC Calculation: 504 R Axis:   -56 Text Interpretation:  Sinus rhythm RBBB and LAFB Probable left ventricular hypertrophy Baseline wander Prolonged QT When compared with ECG of 02/17/2017 QT has lengthened Confirmed by Francine Graven [id number]) on 07/07/2017 1:43:59 PM       Radiology   Procedures Procedures (including critical care time)  Medications Ordered in ED Medications  ipratropium-albuterol (DUONEB) 0.5-2.5 (3) MG/3ML nebulizer solution 3 mL (not administered)  LORazepam (ATIVAN) injection 0.5 mg (not administered)     Initial Impression / Assessment and Plan / ED Course  I have reviewed the triage vital signs and the nursing notes.  Pertinent labs & imaging results that were available during my care of the patient were reviewed by me and considered in my medical decision making (see chart for details).  MDM Reviewed: previous chart, nursing note and vitals Reviewed previous: labs and ECG Interpretation: labs, ECG, x-ray and CT scan   Results for orders placed or performed during the hospital encounter of 07/07/17  Urinalysis, Routine w reflex microscopic  Result Value Ref Range   Color, Urine YELLOW YELLOW   APPearance CLEAR CLEAR   Specific Gravity, Urine 1.009 1.005 - 1.030   pH 6.0 5.0 - 8.0   Glucose, UA NEGATIVE NEGATIVE mg/dL   Hgb urine dipstick NEGATIVE NEGATIVE   Bilirubin Urine NEGATIVE NEGATIVE   Ketones, ur 5 (A) NEGATIVE mg/dL   Protein, ur NEGATIVE NEGATIVE mg/dL   Nitrite NEGATIVE NEGATIVE   Leukocytes, UA NEGATIVE NEGATIVE  Comprehensive metabolic panel  Result Value Ref Range   Sodium 139 135 - 145 mmol/L   Potassium 4.1 3.5 - 5.1 mmol/L   Chloride 106 101 - 111 mmol/L   CO2 18 (L) 22 - 32 mmol/L   Glucose, Bld 108 (H) 65 - 99 mg/dL   BUN 12 6 - 20 mg/dL  Creatinine, Ser 0.54 0.44 - 1.00 mg/dL    Calcium 9.5 8.9 - 10.3 mg/dL   Total Protein 7.6 6.5 - 8.1 g/dL   Albumin 4.0 3.5 - 5.0 g/dL   AST 23 15 - 41 U/L   ALT 12 (L) 14 - 54 U/L   Alkaline Phosphatase 81 38 - 126 U/L   Total Bilirubin 0.5 0.3 - 1.2 mg/dL   GFR calc non Af Amer >60 >60 mL/min   GFR calc Af Amer >60 >60 mL/min   Anion gap 15 5 - 15  Troponin I  Result Value Ref Range   Troponin I <0.03 <0.03 ng/mL  Lactic acid, plasma  Result Value Ref Range   Lactic Acid, Venous 1.9 0.5 - 1.9 mmol/L  Lactic acid, plasma  Result Value Ref Range   Lactic Acid, Venous 1.6 0.5 - 1.9 mmol/L  CBC with Differential  Result Value Ref Range   WBC 8.3 4.0 - 10.5 K/uL   RBC 3.82 (L) 3.87 - 5.11 MIL/uL   Hemoglobin 10.4 (L) 12.0 - 15.0 g/dL   HCT 34.3 (L) 36.0 - 46.0 %   MCV 89.8 78.0 - 100.0 fL   MCH 27.2 26.0 - 34.0 pg   MCHC 30.3 30.0 - 36.0 g/dL   RDW 19.6 (H) 11.5 - 15.5 %   Platelets 392 150 - 400 K/uL   Neutrophils Relative % 64 %   Neutro Abs 5.2 1.7 - 7.7 K/uL   Lymphocytes Relative 26 %   Lymphs Abs 2.2 0.7 - 4.0 K/uL   Monocytes Relative 9 %   Monocytes Absolute 0.8 0.1 - 1.0 K/uL   Eosinophils Relative 0 %   Eosinophils Absolute 0.0 0.0 - 0.7 K/uL   Basophils Relative 1 %   Basophils Absolute 0.1 0.0 - 0.1 K/uL  Urine rapid drug screen (hosp performed)  Result Value Ref Range   Opiates POSITIVE (A) NONE DETECTED   Cocaine NONE DETECTED NONE DETECTED   Benzodiazepines POSITIVE (A) NONE DETECTED   Amphetamines NONE DETECTED NONE DETECTED   Tetrahydrocannabinol NONE DETECTED NONE DETECTED   Barbiturates NONE DETECTED NONE DETECTED  Acetaminophen level  Result Value Ref Range   Acetaminophen (Tylenol), Serum <10 (L) 10 - 30 ug/mL  Ethanol  Result Value Ref Range   Alcohol, Ethyl (B) <41 <66 mg/dL  Salicylate level  Result Value Ref Range   Salicylate Lvl <0.6 2.8 - 30.0 mg/dL  Magnesium  Result Value Ref Range   Magnesium 2.0 1.7 - 2.4 mg/dL  Brain natriuretic peptide  Result Value Ref Range   B  Natriuretic Peptide 126.0 (H) 0.0 - 100.0 pg/mL   Dg Chest 1 View Result Date: 07/07/2017 CLINICAL DATA:  Confusion. EXAM: CHEST 1 VIEW COMPARISON:  February 17, 2017 FINDINGS: Suggested mild pulmonary venous congestion. The heart, tortuous thoracic aorta, hila, and mediastinum are unchanged. No nodules or masses. No focal infiltrates. IMPRESSION: Suggested mild pulmonary venous congestion. Electronically Signed   By: Dorise Bullion III M.D   On: 07/07/2017 15:29   Ct Head Wo Contrast Result Date: 07/07/2017 CLINICAL DATA:  Worsening confusion.  Altered mental status. EXAM: CT HEAD WITHOUT CONTRAST TECHNIQUE: Contiguous axial images were obtained from the base of the skull through the vertex without intravenous contrast. COMPARISON:  02/17/2017 FINDINGS: Brain: Generalized atrophy. No acute abnormality affecting the brainstem, cerebellum or cerebral hemispheres by CT. Old left parietal cortical and subcortical infarction. Chronic small-vessel changes throughout the white matter. No sign of mass lesion, hemorrhage, hydrocephalus or extra-axial collection.  Vascular: There is atherosclerotic calcification of the major vessels at the base of the brain. Skull: Negative Sinuses/Orbits: Clear/normal Other: None IMPRESSION: No acute finding by CT. Old left parietal cortical and subcortical infarction. Chronic small-vessel ischemic changes of the hemispheric white matter. Electronically Signed   By: Nelson Chimes M.D.   On: 07/07/2017 15:22    1655:  H/H per baseline. Pt continuously picking at her mouth; low dose IV ativan given with partial effect, will give another dose. Pt unable to stand without heavy assist for orthostatic VS, and unable to ambulate. Pt usually ambulates with walker at home. Pt hypoxic on arrival; O2 N/C applied and short neb given with improvement. Pt will likely need HH services +/- geropsych eval once medically cleared. T/C returned from [facility] Dr. Aggie Moats, case discussed, including:  HPI,  pertinent PM/SHx, VS/PE, dx testing, ED course and treatment:  Agreeable to admit.    Final Clinical Impressions(s) / ED Diagnoses   Final diagnoses:  None    ED Discharge Orders    None        Francine Graven, DO 07/09/17 2151

## 2017-07-07 NOTE — Progress Notes (Signed)
Pt arrived to rm [id number].

## 2017-07-07 NOTE — H&P (Signed)
[facility] History and Physical  [name] EVO:[id number] DOB: Oct 08, 1940 DOA: 07/07/2017  Referring physician:  PCP: Mikey Kirschner, MD   Chief Complaint: "I'm talking to the doctor." (Patient speaks to her hand.)  HPI: [name] is a 77 y.o. female  With pmhx significant for COPD, FM, HLD, HTN, hypothyroid, tobacco abuse, mild cognitive impairment.  History gathered from patient's son reports that patient has been confused for 3 days.  Speaking to inanimate objects.  Seeing per who are not there.  This typically happens when the patient has urinary tract infection.  Probably had patient brought to the ED for evaluation for UTI.  ED Course:  CT head and UA neg. CXR did not show pna. Pt wih high BPs in ED. EDP consulted geripsych admit but given need for medical clearance and having BP addressed consulted the hospitalist for admission.  Review of Systems:  As per HPI otherwise 10 point review of systems negative.    Past Medical History:  Diagnosis Date  . Anxiety   . Aphasia due to stroke   . Apraxia due to stroke   . Cognitive impairment   . COPD (chronic obstructive pulmonary disease) ([place])    NOT on home O2  . Fibromyalgia   . Gastritis   . GERD (gastroesophageal reflux disease)   . Headache(784.0)   . Hyperlipidemia   . Hypertension   . Hypothyroidism   . Lumbar spondylosis 01/23/2011  . Osteoporosis   . Stroke ([place])   . Tobacco abuse    ongoing   Past Surgical History:  Procedure Laterality Date  . CHOLECYSTECTOMY    . COLONOSCOPY WITH ESOPHAGOGASTRODUODENOSCOPY (EGD)  2006   Dr. Laural Golden: normal upper endoscopy with surgically alterated stomach, normal colon but redundant  . ESOPHAGOGASTRODUODENOSCOPY  04/06/2014   Dr.Rourk: abnormal esophagus  . GASTRIC BYPASS     open  . KNEE SURGERY Left    Social History:  reports that she has been smoking cigarettes.  She has a 21.12 pack-year smoking history. she has never used smokeless tobacco.  She reports that she does not drink alcohol or use drugs.  Allergies  Allergen Reactions  . Levaquin [Levofloxacin In D5w] Swelling    To mouth  . Macrobid [Nitrofurantoin Macrocrystal] Other (See Comments)    Patient can't remember reaction.    Family History  Problem Relation Age of Onset  . Heart attack Father   . Colon cancer Brother      Prior to Admission medications   Medication Sig Start Date End Date Taking? Authorizing Provider  acetaminophen (TYLENOL) 325 MG tablet Take 2 tablets (650 mg total) by mouth every 6 (six) hours as needed for mild pain (or Fever >/= 101). 04/30/14  Yes Velvet Bathe, MD  alendronate (FOSAMAX) 70 MG tablet TAKE 1 TABLET BY MOUTH ONCE A WEEK AS DIRECTED 05/02/17  Yes Mikey Kirschner, MD  ALPRAZolam Duanne Moron) 1 MG tablet TAKE 1 TABLET BY MOUTH AT BEDTIME AND 1/2 A TABLET DURING THE DAY AS NEEDED 06/05/17  Yes Mikey Kirschner, MD  amoxicillin (AMOXIL) 500 MG capsule TAKE 1 CAPSULE BY MOUTH THREE TIMES DAILY UNTIL GONE 06/17/17  Yes [provider]  amoxicillin-clavulanate (AUGMENTIN) 875-125 MG tablet Take 1 tablet by mouth 2 (two) times daily. 05/27/17  Yes Mikey Kirschner, MD  carbamide peroxide (DEBROX) 6.5 % OTIC solution Place 5 drops into both ears daily as needed. 05/27/17  Yes Mikey Kirschner, MD  HYDROcodone-acetaminophen [facility]) 10-325 MG tablet One  po Q 6 hours prn 06/05/17  Yes Mikey Kirschner, MD  hydrOXYzine (ATARAX/VISTARIL) 25 MG tablet TAKE 1 TABLET BY MOUTH EVERY 6 HOURS AS NEEDED FOR ITCHING 10/16/16  Yes Mikey Kirschner, MD  levothyroxine (SYNTHROID, LEVOTHROID) 112 MCG tablet Take 1 tablet (112 mcg total) by mouth daily. 05/02/17  Yes Mikey Kirschner, MD  Multiple Vitamin (MULTIVITAMIN WITH MINERALS) TABS Take 1 tablet by mouth daily.   Yes [provider]  nortriptyline (PAMELOR) 50 MG capsule TAKE 1 CAPSULE BY MOUTH EVERYDAY AT BEDTIME 05/02/17  Yes Mikey Kirschner, MD  pantoprazole (PROTONIX) 40 MG tablet  TAKE 1 TABLET BY MOUTH EVERY DAY *STOP RANITIDINE* 05/02/17  Yes Mikey Kirschner, MD  PARoxetine (PAXIL) 40 MG tablet Take 1 tablet (40 mg total) by mouth daily. 05/02/17  Yes Mikey Kirschner, MD  pravastatin (PRAVACHOL) 20 MG tablet Take 1 tablet (20 mg total) by mouth daily. 05/02/17  Yes Mikey Kirschner, MD   Physical Exam: Vitals:   07/07/17 1600 07/07/17 1614 07/07/17 1645 07/07/17 1730  BP:  (!) 187/76  (!) 137/94  Pulse:  97 97 95  Resp: 19 16 (!) 26 20  Temp:      TempSrc:      SpO2:  96% 98% 97%  Weight:      Height:        Wt Readings from Last 3 Encounters:  07/07/17 61.2 kg (135 lb)  02/18/17 76.6 kg (168 lb 14 oz)  10/24/16 83 kg (183 lb)    General:  Appears calm and comfortable; alert, using her pulse ox as a a phone Eyes:  PERRL, EOMI, normal lids, iris ENT:  grossly normal hearing, lips & tongue Neck:  no LAD, masses or thyromegaly Cardiovascular:  RRR, no m/r/g. No LE edema.  Respiratory:  CTA bilaterally, no w/r/r. Normal respiratory effort. Abdomen:  soft, ntnd Skin:  no rash or induration seen on limited exam Musculoskeletal:  grossly normal tone BUE/BLE Psychiatric:  grossly normal mood and affect, speech fluent and appropriate Neurologic:  CN 2-12 grossly intact, moves all extremities in coordinated fashion.          Labs on Admission:  Basic Metabolic Panel: Recent Labs  Lab 07/07/17 1401 07/07/17 1404  NA 139  --   K 4.1  --   CL 106  --   CO2 18*  --   GLUCOSE 108*  --   BUN 12  --   CREATININE 0.54  --   CALCIUM 9.5  --   MG  --  2.0   Liver Function Tests: Recent Labs  Lab 07/07/17 1401  AST 23  ALT 12*  ALKPHOS 81  BILITOT 0.5  PROT 7.6  ALBUMIN 4.0   No results for input(s): LIPASE, AMYLASE in the last 168 hours. Recent Labs  Lab 07/07/17 1734  AMMONIA 20   CBC: Recent Labs  Lab 07/07/17 1401  WBC 8.3  NEUTROABS 5.2  HGB 10.4*  HCT 34.3*  MCV 89.8  PLT 392   Cardiac Enzymes: Recent Labs  Lab  07/07/17 1401  TROPONINI <0.03    BNP (last 3 results) Recent Labs    07/07/17 1401  BNP 126.0*    ProBNP (last 3 results) No results for input(s): PROBNP in the last 8760 hours.   Serum creatinine: 0.54 mg/dL 07/07/17 1401 Estimated creatinine clearance: 50.7 mL/min  CBG: No results for input(s): GLUCAP in the last 168 hours.  Radiological Exams on Admission: Dg Chest 1  View  Result Date: 07/07/2017 CLINICAL DATA:  Confusion. EXAM: CHEST 1 VIEW COMPARISON:  February 17, 2017 FINDINGS: Suggested mild pulmonary venous congestion. The heart, tortuous thoracic aorta, hila, and mediastinum are unchanged. No nodules or masses. No focal infiltrates. IMPRESSION: Suggested mild pulmonary venous congestion. Electronically Signed   By: Dorise Bullion III M.D   On: 07/07/2017 15:29   Ct Head Wo Contrast  Result Date: 07/07/2017 CLINICAL DATA:  Worsening confusion.  Altered mental status. EXAM: CT HEAD WITHOUT CONTRAST TECHNIQUE: Contiguous axial images were obtained from the base of the skull through the vertex without intravenous contrast. COMPARISON:  02/17/2017 FINDINGS: Brain: Generalized atrophy. No acute abnormality affecting the brainstem, cerebellum or cerebral hemispheres by CT. Old left parietal cortical and subcortical infarction. Chronic small-vessel changes throughout the white matter. No sign of mass lesion, hemorrhage, hydrocephalus or extra-axial collection. Vascular: There is atherosclerotic calcification of the major vessels at the base of the brain. Skull: Negative Sinuses/Orbits: Clear/normal Other: None IMPRESSION: No acute finding by CT. Old left parietal cortical and subcortical infarction. Chronic small-vessel ischemic changes of the hemispheric white matter. Electronically Signed   By: Nelson Chimes M.D.   On: 07/07/2017 15:22    EKG: Independently reviewed. NSR, RBBB, no stemi.  Assessment/Plan Active Problems:   Acute encephalopathy  Encephalopathy Acute event  versus worsening of her chronic baseline mental impairment UA negative Chest x-ray showed pneumonia Checking ammonia level CT head negative, consider MRI Mild dehydrated so will hydrate patient Hold any medications that cause confusion --Note patient does have a history of sundowning May need geripsych d/c  Hypertension When necessary hydralazine 10 mg IV as needed for severe blood pressure Will monitor  Anxiety Hold xanax, paxil Cont pamelor  Cerumen Cont debrox  Vertigo Hold vistaril  Hypothyroidism Cont OP synthroid 112 mcg qd No signs of hyper or hypothyroidism Check TSH  GERD Cont PPI  GERD Cont PPI   Code Status: DNR  DVT Prophylaxis: lovenox Family Communication: son at bedside Disposition Plan: Pending Improvement  Status: inpt tele  Elwin Mocha, MD Family Medicine [facility] [email or web address] Password [email or web address]

## 2017-07-07 NOTE — ED Notes (Signed)
Writer obtained orthostatic vital signs but was unable to complete due to patient not able to stand for the full length of time. Writer had to place BP cuff on right lower leg due to patients confusion to keep arms still during readings.

## 2017-07-07 NOTE — ED Notes (Addendum)
Patient unable to stand any longer than 5 seconds during orthostatics. Patient also took 2 person full assist to attempt to stand during orthostatics, therefor patient unable to ambulate.

## 2017-07-08 ENCOUNTER — Inpatient Hospital Stay (HOSPITAL_COMMUNITY): Payer: Medicare HMO

## 2017-07-08 DIAGNOSIS — R443 Hallucinations, unspecified: Secondary | ICD-10-CM

## 2017-07-08 LAB — CBC
HCT: 35.7 % — ABNORMAL LOW (ref 36.0–46.0)
Hemoglobin: 10.8 g/dL — ABNORMAL LOW (ref 12.0–15.0)
MCH: 27.4 pg (ref 26.0–34.0)
MCHC: 30.3 g/dL (ref 30.0–36.0)
MCV: 90.6 fL (ref 78.0–100.0)
PLATELETS: 422 10*3/uL — AB (ref 150–400)
RBC: 3.94 MIL/uL (ref 3.87–5.11)
RDW: 19.5 % — ABNORMAL HIGH (ref 11.5–15.5)
WBC: 8.8 10*3/uL (ref 4.0–10.5)

## 2017-07-08 LAB — BASIC METABOLIC PANEL
Anion gap: 14 (ref 5–15)
BUN: 7 mg/dL (ref 6–20)
CALCIUM: 9.4 mg/dL (ref 8.9–10.3)
CO2: 23 mmol/L (ref 22–32)
CREATININE: 0.47 mg/dL (ref 0.44–1.00)
Chloride: 101 mmol/L (ref 101–111)
Glucose, Bld: 109 mg/dL — ABNORMAL HIGH (ref 65–99)
Potassium: 3.6 mmol/L (ref 3.5–5.1)
SODIUM: 138 mmol/L (ref 135–145)

## 2017-07-08 LAB — VITAMIN B12: Vitamin B-12: 221 pg/mL (ref 180–914)

## 2017-07-08 MED ORDER — SODIUM CHLORIDE 0.9 % IV SOLN
INTRAVENOUS | Status: DC
  Administered 2017-07-08 – 2017-07-10 (×3): via INTRAVENOUS

## 2017-07-08 NOTE — Progress Notes (Signed)
Patient has been recommended geriatric inpatient treatment today 2/25 per Elmarie Shiley NP.  Patient has been referred to the following inpatient treatment facilities: [facility], [facility], [place], [place], [place], [place], [place], [place], [place], and [place].  CSW in disposition will continue to follow up and seek placement.  [name], MSW, [place] Clinical social worker in [facility], [place] Office [phone] and [phone] 07/08/2017 8:55 PM
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
## 2017-07-08 NOTE — Progress Notes (Signed)
Patient was accepted to [facility], per clinician Quillian Quince.   Patient would need to be IVC'd. RN Anderson Malta was informed. Patient would need to bring her walker from home. Per Quillian Quince, the nurse is aware and will contact patient's family to bring the walker from home.   Accepting provider is Dr. Jonelle Sports patient would need to arrive tomorrow as close as possible to 10:00 AM [facility] address: [address] [place]   [facility] RN Anderson Malta was informed.  Verlon Setting, MSW, [place] Clinical social worker in [facility], [place] Office [phone] and [phone] 07/08/2017 9:23 PM

## 2017-07-08 NOTE — Progress Notes (Signed)
Quillian Quince called about the patient was accepted to [facility]. Catia from social work also called about bed placement and she was accepted and her arrival time is 10 am for tomorrow if possible. Catia informed me that the patient would need IVC paperwork in order to be accepted. Paged Mid-level and MD about situation. Awaiting any new orders or information.

## 2017-07-08 NOTE — Progress Notes (Signed)
PROGRESS NOTE    [name]  JKK:[id number] DOB: 1940-09-19 DOA: 07/07/2017 PCP: Mikey Kirschner, MD     Brief Narrative:  77 year old woman admitted from home on 2/24 due to confusion.  This patient has a past medical history significant for COPD, hyperlipidemia, hypertension, hypothyroidism, tobacco abuse and mild cognitive impairment.  Son states that she has been confused for 3 days, speaking to inanimate objects, seeing people who are not there and states that this typically happens when she has a urine infection.  Admission requested   Assessment & Plan:   Active Problems:   Acute encephalopathy   Acute encephalopathy/psychosis/hallucinations -Patient has been medically cleared at this point. -No signs of infection: UA negative, chest x-ray negative for pneumonia, blood cultures are pending but so far are negative. -CT scan of the head without changes, MRI is severely motion degraded but no large vessel ischemia. -All labs including chemistries are within normal limits.  TSH is also normal, B12 is pending. -Psychiatry consultation has been requested and is pending, suspect she will need a geriatric psychiatry admission. -She does have a history of mild cognitive impairment.  Hypothyroidism -TSH within normal limits, continue Synthroid at 112  Hyperlipidemia -Continue statin   DVT prophylaxis: Lovenox Code Status: DNR Family Communication: Son at bedside updated on plan of care and all questions answered Disposition Plan: Pending psychiatry recommendations  Consultants:   Psychiatry pending  Procedures:   None  Antimicrobials:  Anti-infectives (From admission, onward)   None       Subjective: Psychotic, hallucinating.  I have spent approximately 15 minutes in the room discussing patient's condition and obtaining history with son and throughout this whole time patient has her hands up to her ear as if she were speaking on the telephone and carrying  out a full conversation with her niece who was not even present in the room.  Her mood is labile and she switches between smiling and laughing to being anxious and crying out "what am I going to do?!".  Objective: Vitals:   07/07/17 2017 07/07/17 2100 07/08/17 0511 07/08/17 1452  BP:  (!) 154/73 140/60 (!) 169/87  Pulse:  93 78 100  Resp:  20 20 18   Temp:  97.8 F (36.6 C) 98.5 F (36.9 C) 99.3 F (37.4 C)  TempSrc:  Oral Oral Oral  SpO2: 94% 100% 97% 91%  Weight:   64.8 kg (142 lb 13.7 oz)   Height:        Intake/Output Summary (Last 24 hours) at 07/08/2017 1649 Last data filed at 07/08/2017 1153 Gross per 24 hour  Intake 50 ml  Output 1000 ml  Net -950 ml   Filed Weights   07/07/17 1255 07/07/17 1845 07/08/17 0511  Weight: 61.2 kg (135 lb) 66.7 kg (147 lb) 64.8 kg (142 lb 13.7 oz)    Examination:  General exam: Alert, awake, oriented to person and place, she can tell me she is in the hospital at [facility]. Respiratory system: Clear to auscultation. Respiratory effort normal. Cardiovascular system:RRR. No murmurs, rubs, gallops. Gastrointestinal system: Abdomen is nondistended, soft and nontender. No organomegaly or masses felt. Normal bowel sounds heard. Central nervous system: Awake, moves all 4 spontaneously Extremities: No C/C/E, +pedal pulses Skin: No rashes, lesions or ulcers Psychiatry: Psychotic, mood is labile    Data Reviewed: I have personally reviewed following labs and imaging studies  CBC: Recent Labs  Lab 07/07/17 1401 07/08/17 0451  WBC 8.3 8.8  NEUTROABS 5.2  --  HGB 10.4* 10.8*  HCT 34.3* 35.7*  MCV 89.8 90.6  PLT 392 154*   Basic Metabolic Panel: Recent Labs  Lab 07/07/17 1401 07/07/17 1404 07/08/17 0451  NA 139  --  138  K 4.1  --  3.6  CL 106  --  101  CO2 18*  --  23  GLUCOSE 108*  --  109*  BUN 12  --  7  CREATININE 0.54  --  0.47  CALCIUM 9.5  --  9.4  MG  --  2.0  --    GFR: Estimated Creatinine Clearance: 53 mL/min  (by C-G formula based on SCr of 0.47 mg/dL). Liver Function Tests: Recent Labs  Lab 07/07/17 1401  AST 23  ALT 12*  ALKPHOS 81  BILITOT 0.5  PROT 7.6  ALBUMIN 4.0   No results for input(s): LIPASE, AMYLASE in the last 168 hours. Recent Labs  Lab 07/07/17 1734  AMMONIA 20   Coagulation Profile: No results for input(s): INR, PROTIME in the last 168 hours. Cardiac Enzymes: Recent Labs  Lab 07/07/17 1401  TROPONINI <0.03   BNP (last 3 results) No results for input(s): PROBNP in the last 8760 hours. HbA1C: No results for input(s): HGBA1C in the last 72 hours. CBG: No results for input(s): GLUCAP in the last 168 hours. Lipid Profile: No results for input(s): CHOL, HDL, LDLCALC, TRIG, CHOLHDL, LDLDIRECT in the last 72 hours. Thyroid Function Tests: Recent Labs    07/07/17 1734  TSH 4.147   Anemia Panel: No results for input(s): VITAMINB12, FOLATE, FERRITIN, TIBC, IRON, RETICCTPCT in the last 72 hours. Urine analysis:    Component Value Date/Time   COLORURINE YELLOW 07/07/2017 1314   APPEARANCEUR CLEAR 07/07/2017 1314   LABSPEC 1.009 07/07/2017 1314   PHURINE 6.0 07/07/2017 1314   GLUCOSEU NEGATIVE 07/07/2017 1314   HGBUR NEGATIVE 07/07/2017 1314   BILIRUBINUR NEGATIVE 07/07/2017 1314   BILIRUBINUR ++ 09/30/2012 1319   KETONESUR 5 (A) 07/07/2017 1314   PROTEINUR NEGATIVE 07/07/2017 1314   UROBILINOGEN 0.2 03/20/2015 1400   NITRITE NEGATIVE 07/07/2017 1314   LEUKOCYTESUR NEGATIVE 07/07/2017 1314   Sepsis Labs: @LABRCNTIP (procalcitonin:4,lacticidven:4)  )No results found for this or any previous visit (from the past 240 hour(s)).       Radiology Studies: Dg Chest 1 View  Result Date: 07/07/2017 CLINICAL DATA:  Confusion. EXAM: CHEST 1 VIEW COMPARISON:  February 17, 2017 FINDINGS: Suggested mild pulmonary venous congestion. The heart, tortuous thoracic aorta, hila, and mediastinum are unchanged. No nodules or masses. No focal infiltrates. IMPRESSION:  Suggested mild pulmonary venous congestion. Electronically Signed   By: Dorise Bullion III M.D   On: 07/07/2017 15:29   Ct Head Wo Contrast  Result Date: 07/07/2017 CLINICAL DATA:  Worsening confusion.  Altered mental status. EXAM: CT HEAD WITHOUT CONTRAST TECHNIQUE: Contiguous axial images were obtained from the base of the skull through the vertex without intravenous contrast. COMPARISON:  02/17/2017 FINDINGS: Brain: Generalized atrophy. No acute abnormality affecting the brainstem, cerebellum or cerebral hemispheres by CT. Old left parietal cortical and subcortical infarction. Chronic small-vessel changes throughout the white matter. No sign of mass lesion, hemorrhage, hydrocephalus or extra-axial collection. Vascular: There is atherosclerotic calcification of the major vessels at the base of the brain. Skull: Negative Sinuses/Orbits: Clear/normal Other: None IMPRESSION: No acute finding by CT. Old left parietal cortical and subcortical infarction. Chronic small-vessel ischemic changes of the hemispheric white matter. Electronically Signed   By: Nelson Chimes M.D.   On: 07/07/2017 15:22  Mr Jodene Nam Head Wo Contrast  Result Date: 07/08/2017 CLINICAL DATA:  Weakness and confusion for 2 days. EXAM: MRI HEAD WITHOUT CONTRAST MRA HEAD WITHOUT CONTRAST TECHNIQUE: Multiplanar, multiecho pulse sequences of the brain and surrounding structures were obtained without intravenous contrast. Angiographic images of the head were obtained using MRA technique without contrast. COMPARISON:  Head CT 07/07/2017 FINDINGS: The study is degraded by motion, despite efforts to reduce this artifact, including utilization of motion-resistant MR sequences. The findings of the study are interpreted in the context of reduced sensitivity/specificity. The examination had to be discontinued prior to completion due to patient claustrophobia. Sagittal T1-weighted imaging, axial T2-weighted imaging and axial and coronal diffusion-weighted  imaging with corresponding ADC maps were obtained. Additionally, time-of-flight intracranial MRA was performed. MRI HEAD FINDINGS Brain: Severely motion degraded study. Within that limitation, midline structures are normal. No abnormal diffusion restriction to indicate acute infarct. No midline shift or other mass effect. Old left temporal lobe infarct with associated encephalomalacia. Early confluent periventricular and subcortical white matter hyperintensity, most often a result of chronic microvascular ischemia. Skull and upper cervical spine: Normal marrow signal. Sinuses/Orbits: Clear paranasal sinuses.  Normal orbits. Other: None. MRA HEAD FINDINGS Severe motion degradation of the study. Within that limitation, the proximal vessels are patent and of normal caliber. There is a right posterior communicating artery. Both posterior cerebral arteries are patent. There is diffuse contour irregularity of the intracranial vessels, but it is not clear whether this is due to atherosclerosis or motion. IMPRESSION: 1. Severely motion degraded and truncated examination due to patient claustrophobia. Future examinations should be performed with sedation, if possible. 2. Within that limitation, no acute ischemia or large vessel occlusion. 3. Old left temporal lobe infarct and moderate chronic microvascular disease. 4. Diffuse irregularity of the intracranial arterial contours; however, it is unclear whether this is an artifact of motion or a finding of atherosclerosis. Electronically Signed   By: Ulyses Jarred M.D.   On: 07/08/2017 13:42   Mr Brain Wo Contrast  Result Date: 07/08/2017 CLINICAL DATA:  Weakness and confusion for 2 days. EXAM: MRI HEAD WITHOUT CONTRAST MRA HEAD WITHOUT CONTRAST TECHNIQUE: Multiplanar, multiecho pulse sequences of the brain and surrounding structures were obtained without intravenous contrast. Angiographic images of the head were obtained using MRA technique without contrast. COMPARISON:   Head CT 07/07/2017 FINDINGS: The study is degraded by motion, despite efforts to reduce this artifact, including utilization of motion-resistant MR sequences. The findings of the study are interpreted in the context of reduced sensitivity/specificity. The examination had to be discontinued prior to completion due to patient claustrophobia. Sagittal T1-weighted imaging, axial T2-weighted imaging and axial and coronal diffusion-weighted imaging with corresponding ADC maps were obtained. Additionally, time-of-flight intracranial MRA was performed. MRI HEAD FINDINGS Brain: Severely motion degraded study. Within that limitation, midline structures are normal. No abnormal diffusion restriction to indicate acute infarct. No midline shift or other mass effect. Old left temporal lobe infarct with associated encephalomalacia. Early confluent periventricular and subcortical white matter hyperintensity, most often a result of chronic microvascular ischemia. Skull and upper cervical spine: Normal marrow signal. Sinuses/Orbits: Clear paranasal sinuses.  Normal orbits. Other: None. MRA HEAD FINDINGS Severe motion degradation of the study. Within that limitation, the proximal vessels are patent and of normal caliber. There is a right posterior communicating artery. Both posterior cerebral arteries are patent. There is diffuse contour irregularity of the intracranial vessels, but it is not clear whether this is due to atherosclerosis or motion. IMPRESSION: 1. Severely motion  degraded and truncated examination due to patient claustrophobia. Future examinations should be performed with sedation, if possible. 2. Within that limitation, no acute ischemia or large vessel occlusion. 3. Old left temporal lobe infarct and moderate chronic microvascular disease. 4. Diffuse irregularity of the intracranial arterial contours; however, it is unclear whether this is an artifact of motion or a finding of atherosclerosis. Electronically Signed    By: Ulyses Jarred M.D.   On: 07/08/2017 13:42        Scheduled Meds: . carbamide peroxide  5 drop Both EARS BID  . enoxaparin (LOVENOX) injection  40 mg Subcutaneous Q24H  . levothyroxine  112 mcg Oral QAC breakfast  . nortriptyline  50 mg Oral QHS  . pantoprazole  40 mg Oral Daily  . pravastatin  20 mg Oral q1800   Continuous Infusions: . sodium chloride 75 mL/hr at 07/08/17 1242     LOS: 1 day    Time spent: 25 minutes. Greater than 50% of this time was spent in direct contact with the patient coordinating care.     Lelon Frohlich, MD [facility] Pager [phone]  If 7PM-7AM, please contact night-coverage [email or web address] Password [facility] 07/08/2017, 4:49 PM

## 2017-07-09 ENCOUNTER — Other Ambulatory Visit: Payer: Self-pay

## 2017-07-09 DIAGNOSIS — R627 Adult failure to thrive: Secondary | ICD-10-CM

## 2017-07-09 LAB — GLUCOSE, CAPILLARY: GLUCOSE-CAPILLARY: 107 mg/dL — AB (ref 65–99)

## 2017-07-09 LAB — URINE CULTURE

## 2017-07-09 NOTE — Progress Notes (Signed)
Pt's son did not want his mother to go to the [facility] facility because he said he did not have money to drive there and pay for a place to stay. He has family in the [place] area. He also stated that his mother was not as confused today as she was yesterday. He stated that he thought she was 75% back to baseline. Sandy from [facility] in [place], [place] called and wanted to offer pt a room there. I told her that the pt's family did not want to travel that far. She said she would talk further with the Education officer, museum. Pt's son talked with Dr. Jerilee Hoh about his concerns about his mother being IVC'd.  The pt was IVC'd and had a Actuary.   We had a tele psych consult around 1700. It was agreed upon between the nurse doing the consult and pt and her son Truman Hayward) that the IVC would be removed, the pt would be observed overnight, and there would be another psych consult in the morning to determine placement.

## 2017-07-09 NOTE — Progress Notes (Signed)
Received call from Roosevelt Locks at [facility] ED who reported that pt's son is upset about her going to [facility] and is threatening to take his mother from the ED..  CSW agreed to check with [facility] hospital to see if there are available beds.  [facility], [facility], [facility] related that they are at capacity.  CSW called [facility] CSW back to let her know this and was informed that pt is being IVC'd.  CSW then called [facility] to let them know that pt was delayed in transport due to being IVC'd.  CSW told that while pt had been accepted initially, they reviewed her chart and are declining her because of her dementia.  Disposition CSW will continue to follow for placement.  Areatha Keas. Judi Cong, MSW, [place] [facility]position Clinical Social Work [phone] (cell) [phone] (office)

## 2017-07-09 NOTE — Progress Notes (Signed)
PROGRESS NOTE    [name]  ZOX:[id number] DOB: 04/10/1941 DOA: 07/07/2017 PCP: Mikey Kirschner, MD     Brief Narrative:  77 year old woman admitted from home on 2/24 due to confusion.  This patient has a past medical history significant for COPD, hyperlipidemia, hypertension, hypothyroidism, tobacco abuse and mild cognitive impairment.  Son states that she has been confused for 3 days, speaking to inanimate objects, seeing people who are not there and states that this typically happens when she has a urine infection.  Admission requested   Assessment & Plan:   Active Problems:   Acute encephalopathy   Acute encephalopathy/psychosis/hallucinations -Patient has been medically cleared at this point. -No signs of infection: UA negative, chest x-ray negative for pneumonia, blood cultures are pending but so far are negative. -CT scan of the head without changes, MRI is severely motion degraded but no large vessel ischemia. -All labs including chemistries are within normal limits.  TSH is also normal, B12 is pending. -Has been seen by psychiatry who is recommending inpatient geriatric psych admission -Has been IVC'd per psych recommendations. -Presently awaiting bed availability. -She does have a history of mild cognitive impairment.  Hypothyroidism -TSH within normal limits, continue Synthroid at 112  Hyperlipidemia -Continue statin   DVT prophylaxis: Lovenox Code Status: DNR Family Communication: Son and husband at bedside updated on plan of care and all questions answered Disposition Plan: Inpatient psych placement  Consultants:  Psychiatry Procedures:   None  Antimicrobials:  Anti-infectives (From admission, onward)   None       Subjective: Is more alert today, mood is still labile, smiles and laughs in one sentence and cries in the next.  She makes more eye contact today but remains very tangential and jumps from subject to subject and cannot answer my  questions.  Objective: Vitals:   07/08/17 1452 07/08/17 2111 07/09/17 0500 07/09/17 1427  BP: (!) 169/87 (!) 146/73 104/70 (!) 143/57  Pulse: 100 99 98 85  Resp: 18 20 18 16   Temp: 99.3 F (37.4 C) 98.3 F (36.8 C) 99 F (37.2 C) 99.6 F (37.6 C)  TempSrc: Oral Oral Oral Oral  SpO2: 91% 94% 96% 94%  Weight:   64.4 kg (141 lb 15.6 oz)   Height:        Intake/Output Summary (Last 24 hours) at 07/09/2017 1602 Last data filed at 07/09/2017 1225 Gross per 24 hour  Intake 1582.5 ml  Output 201 ml  Net 1381.5 ml   Filed Weights   07/07/17 1845 07/08/17 0511 07/09/17 0500  Weight: 66.7 kg (147 lb) 64.8 kg (142 lb 13.7 oz) 64.4 kg (141 lb 15.6 oz)    Examination:  General exam: Alert, awake, oriented to person and place Respiratory system: Clear to auscultation. Respiratory effort normal. Cardiovascular system:RRR. No murmurs, rubs, gallops. Gastrointestinal system: Abdomen is nondistended, soft and nontender. No organomegaly or masses felt. Normal bowel sounds heard. Central nervous system: Alert and oriented. No focal neurological deficits. Extremities: No C/C/E, +pedal pulses Skin: No rashes, lesions or ulcers Psychiatry: Psychotic, tangential speech, labile mood     Data Reviewed: I have personally reviewed following labs and imaging studies  CBC: Recent Labs  Lab 07/07/17 1401 07/08/17 0451  WBC 8.3 8.8  NEUTROABS 5.2  --   HGB 10.4* 10.8*  HCT 34.3* 35.7*  MCV 89.8 90.6  PLT 392 811*   Basic Metabolic Panel: Recent Labs  Lab 07/07/17 1401 07/07/17 1404 07/08/17 0451  NA 139  --  138  K 4.1  --  3.6  CL 106  --  101  CO2 18*  --  23  GLUCOSE 108*  --  109*  BUN 12  --  7  CREATININE 0.54  --  0.47  CALCIUM 9.5  --  9.4  MG  --  2.0  --    GFR: Estimated Creatinine Clearance: 53 mL/min (by C-G formula based on SCr of 0.47 mg/dL). Liver Function Tests: Recent Labs  Lab 07/07/17 1401  AST 23  ALT 12*  ALKPHOS 81  BILITOT 0.5  PROT 7.6    ALBUMIN 4.0   No results for input(s): LIPASE, AMYLASE in the last 168 hours. Recent Labs  Lab 07/07/17 1734  AMMONIA 20   Coagulation Profile: No results for input(s): INR, PROTIME in the last 168 hours. Cardiac Enzymes: Recent Labs  Lab 07/07/17 1401  TROPONINI <0.03   BNP (last 3 results) No results for input(s): PROBNP in the last 8760 hours. HbA1C: No results for input(s): HGBA1C in the last 72 hours. CBG: Recent Labs  Lab 07/08/17 0440  GLUCAP 107*   Lipid Profile: No results for input(s): CHOL, HDL, LDLCALC, TRIG, CHOLHDL, LDLDIRECT in the last 72 hours. Thyroid Function Tests: Recent Labs    07/07/17 1734  TSH 4.147   Anemia Panel: Recent Labs    07/07/17 1554  VITAMINB12 221   Urine analysis:    Component Value Date/Time   COLORURINE YELLOW 07/07/2017 [place] 07/07/2017 1314   LABSPEC 1.009 07/07/2017 1314   PHURINE 6.0 07/07/2017 1314   GLUCOSEU NEGATIVE 07/07/2017 1314   HGBUR NEGATIVE 07/07/2017 1314   BILIRUBINUR NEGATIVE 07/07/2017 1314   BILIRUBINUR ++ 09/30/2012 1319   KETONESUR 5 (A) 07/07/2017 1314   PROTEINUR NEGATIVE 07/07/2017 1314   UROBILINOGEN 0.2 03/20/2015 1400   NITRITE NEGATIVE 07/07/2017 1314   LEUKOCYTESUR NEGATIVE 07/07/2017 1314   Sepsis Labs: @LABRCNTIP (procalcitonin:4,lacticidven:4)  ) Recent Results (from the past 240 hour(s))  Urine culture     Status: Abnormal   Collection Time: 07/07/17  1:14 PM  Result Value Ref Range Status   Specimen Description   Final    URINE, CLEAN CATCH Performed at [facility], [address], [place], [place] [address]    Special Requests   Final    NONE Performed at [facility], [address] [place], [place] [address]    Culture MULTIPLE SPECIES PRESENT, SUGGEST RECOLLECTION (A)  Final   Report Status 07/09/2017 FINAL  Final         Radiology Studies: Mr Virgel Paling OZ Contrast  Result Date: 07/08/2017 CLINICAL DATA:  Weakness and confusion for 2  days. EXAM: MRI HEAD WITHOUT CONTRAST MRA HEAD WITHOUT CONTRAST TECHNIQUE: Multiplanar, multiecho pulse sequences of the brain and surrounding structures were obtained without intravenous contrast. Angiographic images of the head were obtained using MRA technique without contrast. COMPARISON:  Head CT 07/07/2017 FINDINGS: The study is degraded by motion, despite efforts to reduce this artifact, including utilization of motion-resistant MR sequences. The findings of the study are interpreted in the context of reduced sensitivity/specificity. The examination had to be discontinued prior to completion due to patient claustrophobia. Sagittal T1-weighted imaging, axial T2-weighted imaging and axial and coronal diffusion-weighted imaging with corresponding ADC maps were obtained. Additionally, time-of-flight intracranial MRA was performed. MRI HEAD FINDINGS Brain: Severely motion degraded study. Within that limitation, midline structures are normal. No abnormal diffusion restriction to indicate acute infarct. No midline shift or other mass effect. Old left temporal  lobe infarct with associated encephalomalacia. Early confluent periventricular and subcortical white matter hyperintensity, most often a result of chronic microvascular ischemia. Skull and upper cervical spine: Normal marrow signal. Sinuses/Orbits: Clear paranasal sinuses.  Normal orbits. Other: None. MRA HEAD FINDINGS Severe motion degradation of the study. Within that limitation, the proximal vessels are patent and of normal caliber. There is a right posterior communicating artery. Both posterior cerebral arteries are patent. There is diffuse contour irregularity of the intracranial vessels, but it is not clear whether this is due to atherosclerosis or motion. IMPRESSION: 1. Severely motion degraded and truncated examination due to patient claustrophobia. Future examinations should be performed with sedation, if possible. 2. Within that limitation, no acute  ischemia or large vessel occlusion. 3. Old left temporal lobe infarct and moderate chronic microvascular disease. 4. Diffuse irregularity of the intracranial arterial contours; however, it is unclear whether this is an artifact of motion or a finding of atherosclerosis. Electronically Signed   By: Ulyses Jarred M.D.   On: 07/08/2017 13:42   Mr Brain Wo Contrast  Result Date: 07/08/2017 CLINICAL DATA:  Weakness and confusion for 2 days. EXAM: MRI HEAD WITHOUT CONTRAST MRA HEAD WITHOUT CONTRAST TECHNIQUE: Multiplanar, multiecho pulse sequences of the brain and surrounding structures were obtained without intravenous contrast. Angiographic images of the head were obtained using MRA technique without contrast. COMPARISON:  Head CT 07/07/2017 FINDINGS: The study is degraded by motion, despite efforts to reduce this artifact, including utilization of motion-resistant MR sequences. The findings of the study are interpreted in the context of reduced sensitivity/specificity. The examination had to be discontinued prior to completion due to patient claustrophobia. Sagittal T1-weighted imaging, axial T2-weighted imaging and axial and coronal diffusion-weighted imaging with corresponding ADC maps were obtained. Additionally, time-of-flight intracranial MRA was performed. MRI HEAD FINDINGS Brain: Severely motion degraded study. Within that limitation, midline structures are normal. No abnormal diffusion restriction to indicate acute infarct. No midline shift or other mass effect. Old left temporal lobe infarct with associated encephalomalacia. Early confluent periventricular and subcortical white matter hyperintensity, most often a result of chronic microvascular ischemia. Skull and upper cervical spine: Normal marrow signal. Sinuses/Orbits: Clear paranasal sinuses.  Normal orbits. Other: None. MRA HEAD FINDINGS Severe motion degradation of the study. Within that limitation, the proximal vessels are patent and of normal  caliber. There is a right posterior communicating artery. Both posterior cerebral arteries are patent. There is diffuse contour irregularity of the intracranial vessels, but it is not clear whether this is due to atherosclerosis or motion. IMPRESSION: 1. Severely motion degraded and truncated examination due to patient claustrophobia. Future examinations should be performed with sedation, if possible. 2. Within that limitation, no acute ischemia or large vessel occlusion. 3. Old left temporal lobe infarct and moderate chronic microvascular disease. 4. Diffuse irregularity of the intracranial arterial contours; however, it is unclear whether this is an artifact of motion or a finding of atherosclerosis. Electronically Signed   By: Ulyses Jarred M.D.   On: 07/08/2017 13:42        Scheduled Meds: . carbamide peroxide  5 drop Both EARS BID  . enoxaparin (LOVENOX) injection  40 mg Subcutaneous Q24H  . levothyroxine  112 mcg Oral QAC breakfast  . nortriptyline  50 mg Oral QHS  . pantoprazole  40 mg Oral Daily  . pravastatin  20 mg Oral q1800   Continuous Infusions: . sodium chloride 75 mL/hr at 07/09/17 0500     LOS: 2 days    Time spent:  25 minutes. Greater than 50% of this time was spent in direct contact with the patient coordinating care.     Lelon Frohlich, MD [facility] Pager [phone]  If 7PM-7AM, please contact night-coverage [email or web address] Password [facility] 07/09/2017, 4:02 PM

## 2017-07-09 NOTE — [facility] Counselor (Addendum)
Reassessment Note- Met with pt and son via telepsych. Pt more alert than previous assessment, making eye contact and acknowledged this Probation officer. Son wants to decline geropsych bed at [facility]. IVC can be terminated- transportation will not be needed. Disposition recommended by Zerita Boers, NP is to continue to observe pt overnight for stability and security with reassessment 07/10/17. Son states he would rather care for pt at home than have her far away from family.

## 2017-07-10 ENCOUNTER — Encounter (HOSPITAL_COMMUNITY): Payer: Self-pay | Admitting: Registered Nurse

## 2017-07-10 DIAGNOSIS — F0151 Vascular dementia with behavioral disturbance: Secondary | ICD-10-CM

## 2017-07-10 DIAGNOSIS — E860 Dehydration: Secondary | ICD-10-CM

## 2017-07-10 DIAGNOSIS — F0150 Vascular dementia without behavioral disturbance: Secondary | ICD-10-CM | POA: Diagnosis present

## 2017-07-10 DIAGNOSIS — F0390 Unspecified dementia without behavioral disturbance: Secondary | ICD-10-CM

## 2017-07-10 DIAGNOSIS — D518 Other vitamin B12 deficiency anemias: Secondary | ICD-10-CM

## 2017-07-10 DIAGNOSIS — Z8659 Personal history of other mental and behavioral disorders: Secondary | ICD-10-CM

## 2017-07-10 DIAGNOSIS — Z62819 Personal history of unspecified abuse in childhood: Secondary | ICD-10-CM

## 2017-07-10 DIAGNOSIS — R441 Visual hallucinations: Secondary | ICD-10-CM

## 2017-07-10 DIAGNOSIS — F17210 Nicotine dependence, cigarettes, uncomplicated: Secondary | ICD-10-CM

## 2017-07-10 DIAGNOSIS — R410 Disorientation, unspecified: Secondary | ICD-10-CM

## 2017-07-10 DIAGNOSIS — G9340 Encephalopathy, unspecified: Secondary | ICD-10-CM | POA: Diagnosis not present

## 2017-07-10 DIAGNOSIS — G9341 Metabolic encephalopathy: Secondary | ICD-10-CM | POA: Diagnosis present

## 2017-07-10 DIAGNOSIS — R443 Hallucinations, unspecified: Secondary | ICD-10-CM | POA: Diagnosis present

## 2017-07-10 DIAGNOSIS — R69 Illness, unspecified: Secondary | ICD-10-CM | POA: Diagnosis not present

## 2017-07-10 LAB — URINALYSIS, ROUTINE W REFLEX MICROSCOPIC
Bilirubin Urine: NEGATIVE
Glucose, UA: NEGATIVE mg/dL
HGB URINE DIPSTICK: NEGATIVE
Ketones, ur: 20 mg/dL — AB
NITRITE: NEGATIVE
PH: 5 (ref 5.0–8.0)
Protein, ur: NEGATIVE mg/dL
Specific Gravity, Urine: 1.013 (ref 1.005–1.030)
Squamous Epithelial / LPF: NONE SEEN

## 2017-07-10 MED ORDER — VITAMIN B-12 1000 MCG PO TABS: 1000.0000 ug | ORAL_TABLET | Freq: Every day | ORAL | 0 refills | Status: AC

## 2017-07-10 NOTE — Discharge Summary (Signed)
Physician Discharge Summary  [name] FWY:[id number] DOB: [birth year] DOA: 07/07/2017  PCP: Mikey Kirschner, MD  Admit date: 07/07/2017 Discharge date: 07/10/2017  Admitted From: Home Disposition: Home   Recommendations for Outpatient Follow-up:  1. Follow up with PCP in 1-2 weeks   Home Health: RN and PT Equipment/Devices: None  Discharge Condition: Fair CODE STATUS: DNR Diet recommendation: Heart Healthy    Discharge Diagnoses:  Active Problems:   Acute metabolic encephalopathy  Active problems   Acute metabolic encephalopathy   Dementia, vascular with delirium Vitamin B12 deficiency  Brief narrative/HPI 77 year old female with history of stroke, hypothyroidism COPD, hypertension, hyperlipidemia and slow progressive cognitive impairment presented with 3 days of increased confusion and hallucinating seeing people in the room and speaking to herself.  Son reported that this happens when she has urine infection. Vitals in the ED was unremarkable.  CT head without acute findings.  Patient admitted for acute encephalopathy with hallucinations.  Hospital course Acute metabolic encephalopathy with psychosis and hallucinations Suspect this is due to progressive dementia with delirium.  Has low B12 (221).  CT head negative for acute findings.  MRI severely motion degraded but no large vessel ischemia.  Ammonia, TSH normal. UA and chest x-ray negative for infection.  Urine drug screen negative for opiates and benzos.  Blood cultures without any growth. Seen by telemetry psychiatry who initially recommended involuntary commitment with inpatient geriatric psych admission. Patient was awaiting bed but family refused to send her to a facility and wanted to take her home. Telemetry psychiatry was consulted again who evaluated patient today and recommended that she now does not meet criteria for psychiatric inpatient admission.  Patient will be discharged home with outpatient PCP  follow-up.  We will arrange home health RN.  Continue nortriptyline and Paxil.  Symptoms have improved since admission.  If persistent or worsen may benefit from adding low-dose Seroquel.  Hypothyroidism TSH normal.  Continue Synthroid.  Vitamin B12 deficiency (221) Likely contributing to her symptoms.  Will add supplement.  Hyperlipidemia with history of stroke Continue statin  Procedure: CT head, MRI brain  Family complication: Son at bedside Disposition: Home Consult: Psychiatry  Discharge Instructions   Allergies as of 07/10/2017      Reactions   Levaquin [levofloxacin In D5w] Swelling   To mouth   Macrobid [nitrofurantoin Macrocrystal] Other (See Comments)   Patient can't remember reaction.      Medication List    STOP taking these medications   amoxicillin 500 MG capsule Commonly known as:  AMOXIL   amoxicillin-clavulanate 875-125 MG tablet Commonly known as:  AUGMENTIN   HYDROcodone-acetaminophen 10-325 MG tablet Commonly known as:  NORCO     TAKE these medications   acetaminophen 325 MG tablet Commonly known as:  TYLENOL Take 2 tablets (650 mg total) by mouth every 6 (six) hours as needed for mild pain (or Fever >/= 101).   alendronate 70 MG tablet Commonly known as:  FOSAMAX TAKE 1 TABLET BY MOUTH ONCE A WEEK AS DIRECTED   ALPRAZolam 1 MG tablet Commonly known as:  XANAX TAKE 1 TABLET BY MOUTH AT BEDTIME AND 1/2 A TABLET DURING THE DAY AS NEEDED   carbamide peroxide 6.5 % OTIC solution Commonly known as:  DEBROX Place 5 drops into both ears daily as needed.   hydrOXYzine 25 MG tablet Commonly known as:  ATARAX/VISTARIL TAKE 1 TABLET BY MOUTH EVERY 6 HOURS AS NEEDED FOR ITCHING   levothyroxine 112 MCG tablet Commonly known as:  SYNTHROID,  LEVOTHROID Take 1 tablet (112 mcg total) by mouth daily.   multivitamin with minerals Tabs tablet Take 1 tablet by mouth daily.   nortriptyline 50 MG capsule Commonly known as:  PAMELOR TAKE 1 CAPSULE BY  MOUTH EVERYDAY AT BEDTIME   pantoprazole 40 MG tablet Commonly known as:  PROTONIX TAKE 1 TABLET BY MOUTH EVERY DAY *STOP RANITIDINE*   PARoxetine 40 MG tablet Commonly known as:  PAXIL Take 1 tablet (40 mg total) by mouth daily.   pravastatin 20 MG tablet Commonly known as:  PRAVACHOL Take 1 tablet (20 mg total) by mouth daily.   vitamin B-12 1000 MCG tablet Commonly known as:  CYANOCOBALAMIN Take 1 tablet (1,000 mcg total) by mouth daily.      Follow-up Information    [facility]   Specialty:  Home Health Services Contact information: [address] [place] [address] [phone]        Mikey Kirschner, MD. Schedule an appointment as soon as possible for a visit in 1 week(s).   Specialty:  Family Medicine Contact information: [place] [address] [phone]          Allergies  Allergen Reactions  . Levaquin [Levofloxacin In D5w] Swelling    To mouth  . Macrobid [Nitrofurantoin Macrocrystal] Other (See Comments)    Patient can't remember reaction.        Procedures/Studies: Dg Chest 1 View  Result Date: 07/07/2017 CLINICAL DATA:  Confusion. EXAM: CHEST 1 VIEW COMPARISON:  February 17, 2017 FINDINGS: Suggested mild pulmonary venous congestion. The heart, tortuous thoracic aorta, hila, and mediastinum are unchanged. No nodules or masses. No focal infiltrates. IMPRESSION: Suggested mild pulmonary venous congestion. Electronically Signed   By: Dorise Bullion III M.D   On: 07/07/2017 15:29   Ct Head Wo Contrast  Result Date: 07/07/2017 CLINICAL DATA:  Worsening confusion.  Altered mental status. EXAM: CT HEAD WITHOUT CONTRAST TECHNIQUE: Contiguous axial images were obtained from the base of the skull through the vertex without intravenous contrast. COMPARISON:  02/17/2017 FINDINGS: Brain: Generalized atrophy. No acute abnormality affecting the brainstem, cerebellum or cerebral hemispheres by  CT. Old left parietal cortical and subcortical infarction. Chronic small-vessel changes throughout the white matter. No sign of mass lesion, hemorrhage, hydrocephalus or extra-axial collection. Vascular: There is atherosclerotic calcification of the major vessels at the base of the brain. Skull: Negative Sinuses/Orbits: Clear/normal Other: None IMPRESSION: No acute finding by CT. Old left parietal cortical and subcortical infarction. Chronic small-vessel ischemic changes of the hemispheric white matter. Electronically Signed   By: Nelson Chimes M.D.   On: 07/07/2017 15:22   Mr Jodene Nam Head Wo Contrast  Result Date: 07/08/2017 CLINICAL DATA:  Weakness and confusion for 2 days. EXAM: MRI HEAD WITHOUT CONTRAST MRA HEAD WITHOUT CONTRAST TECHNIQUE: Multiplanar, multiecho pulse sequences of the brain and surrounding structures were obtained without intravenous contrast. Angiographic images of the head were obtained using MRA technique without contrast. COMPARISON:  Head CT 07/07/2017 FINDINGS: The study is degraded by motion, despite efforts to reduce this artifact, including utilization of motion-resistant MR sequences. The findings of the study are interpreted in the context of reduced sensitivity/specificity. The examination had to be discontinued prior to completion due to patient claustrophobia. Sagittal T1-weighted imaging, axial T2-weighted imaging and axial and coronal diffusion-weighted imaging with corresponding ADC maps were obtained. Additionally, time-of-flight intracranial MRA was performed. MRI HEAD FINDINGS Brain: Severely motion degraded study. Within that limitation, midline structures are normal.  No abnormal diffusion restriction to indicate acute infarct. No midline shift or other mass effect. Old left temporal lobe infarct with associated encephalomalacia. Early confluent periventricular and subcortical white matter hyperintensity, most often a result of chronic microvascular ischemia. Skull and upper  cervical spine: Normal marrow signal. Sinuses/Orbits: Clear paranasal sinuses.  Normal orbits. Other: None. MRA HEAD FINDINGS Severe motion degradation of the study. Within that limitation, the proximal vessels are patent and of normal caliber. There is a right posterior communicating artery. Both posterior cerebral arteries are patent. There is diffuse contour irregularity of the intracranial vessels, but it is not clear whether this is due to atherosclerosis or motion. IMPRESSION: 1. Severely motion degraded and truncated examination due to patient claustrophobia. Future examinations should be performed with sedation, if possible. 2. Within that limitation, no acute ischemia or large vessel occlusion. 3. Old left temporal lobe infarct and moderate chronic microvascular disease. 4. Diffuse irregularity of the intracranial arterial contours; however, it is unclear whether this is an artifact of motion or a finding of atherosclerosis. Electronically Signed   By: Ulyses Jarred M.D.   On: 07/08/2017 13:42   Mr Brain Wo Contrast  Result Date: 07/08/2017 CLINICAL DATA:  Weakness and confusion for 2 days. EXAM: MRI HEAD WITHOUT CONTRAST MRA HEAD WITHOUT CONTRAST TECHNIQUE: Multiplanar, multiecho pulse sequences of the brain and surrounding structures were obtained without intravenous contrast. Angiographic images of the head were obtained using MRA technique without contrast. COMPARISON:  Head CT 07/07/2017 FINDINGS: The study is degraded by motion, despite efforts to reduce this artifact, including utilization of motion-resistant MR sequences. The findings of the study are interpreted in the context of reduced sensitivity/specificity. The examination had to be discontinued prior to completion due to patient claustrophobia. Sagittal T1-weighted imaging, axial T2-weighted imaging and axial and coronal diffusion-weighted imaging with corresponding ADC maps were obtained. Additionally, time-of-flight intracranial MRA was  performed. MRI HEAD FINDINGS Brain: Severely motion degraded study. Within that limitation, midline structures are normal. No abnormal diffusion restriction to indicate acute infarct. No midline shift or other mass effect. Old left temporal lobe infarct with associated encephalomalacia. Early confluent periventricular and subcortical white matter hyperintensity, most often a result of chronic microvascular ischemia. Skull and upper cervical spine: Normal marrow signal. Sinuses/Orbits: Clear paranasal sinuses.  Normal orbits. Other: None. MRA HEAD FINDINGS Severe motion degradation of the study. Within that limitation, the proximal vessels are patent and of normal caliber. There is a right posterior communicating artery. Both posterior cerebral arteries are patent. There is diffuse contour irregularity of the intracranial vessels, but it is not clear whether this is due to atherosclerosis or motion. IMPRESSION: 1. Severely motion degraded and truncated examination due to patient claustrophobia. Future examinations should be performed with sedation, if possible. 2. Within that limitation, no acute ischemia or large vessel occlusion. 3. Old left temporal lobe infarct and moderate chronic microvascular disease. 4. Diffuse irregularity of the intracranial arterial contours; however, it is unclear whether this is an artifact of motion or a finding of atherosclerosis. Electronically Signed   By: Ulyses Jarred M.D.   On: 07/08/2017 13:42       Subjective: No overnight events.  Discharge Exam: Vitals:   07/09/17 2050 07/10/17 0655  BP: (!) 133/55 105/60  Pulse: 77 86  Resp: 18 18  Temp: 98.7 F (37.1 C) 98.6 F (37 C)  SpO2: 92% 95%   Vitals:   07/09/17 0500 07/09/17 1427 07/09/17 2050 07/10/17 0655  BP: 104/70 (!) 143/57 (!) 133/55  105/60  Pulse: 98 85 77 86  Resp: 18 16 18 18   Temp: 99 F (37.2 C) 99.6 F (37.6 C) 98.7 F (37.1 C) 98.6 F (37 C)  TempSrc: Oral Oral Oral Oral  SpO2: 96% 94%  92% 95%  Weight: 64.4 kg (141 lb 15.6 oz)   67.6 kg (149 lb 0.5 oz)  Height:        General: Elderly female not in distress HEENT: Moist mucosa, supple neck Chest: Clear bilaterally CVS: Normal S1 and S2, no murmurs GI: Soft, nondistended, nontender Musculoskeletal: Warm, no edema CNS: Alert and oriented x2     The results of significant diagnostics from this hospitalization (including imaging, microbiology, ancillary and laboratory) are listed below for reference.     Microbiology: Recent Results (from the past 240 hour(s))  Urine culture     Status: Abnormal   Collection Time: 07/07/17  1:14 PM  Result Value Ref Range Status   Specimen Description   Final    URINE, CLEAN CATCH Performed at [facility], [address], [place], [place] [address]    Special Requests   Final    NONE Performed at [facility], [address] [place], [place] [address]    Culture MULTIPLE SPECIES PRESENT, SUGGEST RECOLLECTION (A)  Final   Report Status 07/09/2017 FINAL  Final     Labs: BNP (last 3 results) Recent Labs    07/07/17 1401  BNP 062.3*   Basic Metabolic Panel: Recent Labs  Lab 07/07/17 1401 07/07/17 1404 07/08/17 0451  NA 139  --  138  K 4.1  --  3.6  CL 106  --  101  CO2 18*  --  23  GLUCOSE 108*  --  109*  BUN 12  --  7  CREATININE 0.54  --  0.47  CALCIUM 9.5  --  9.4  MG  --  2.0  --    Liver Function Tests: Recent Labs  Lab 07/07/17 1401  AST 23  ALT 12*  ALKPHOS 81  BILITOT 0.5  PROT 7.6  ALBUMIN 4.0   No results for input(s): LIPASE, AMYLASE in the last 168 hours. Recent Labs  Lab 07/07/17 1734  AMMONIA 20   CBC: Recent Labs  Lab 07/07/17 1401 07/08/17 0451  WBC 8.3 8.8  NEUTROABS 5.2  --   HGB 10.4* 10.8*  HCT 34.3* 35.7*  MCV 89.8 90.6  PLT 392 422*   Cardiac Enzymes: Recent Labs  Lab 07/07/17 1401  TROPONINI <0.03   BNP: Invalid input(s): POCBNP CBG: Recent Labs  Lab 07/08/17 0440  GLUCAP 107*   D-Dimer No results  for input(s): DDIMER in the last 72 hours. Hgb A1c No results for input(s): HGBA1C in the last 72 hours. Lipid Profile No results for input(s): CHOL, HDL, LDLCALC, TRIG, CHOLHDL, LDLDIRECT in the last 72 hours. Thyroid function studies Recent Labs    07/07/17 1734  TSH 4.147   Anemia work up Recent Labs    07/07/17 1554  VITAMINB12 221   Urinalysis    Component Value Date/Time   COLORURINE AMBER (A) 07/10/2017 1312   APPEARANCEUR CLOUDY (A) 07/10/2017 1312   LABSPEC 1.013 07/10/2017 1312   PHURINE 5.0 07/10/2017 1312   GLUCOSEU NEGATIVE 07/10/2017 1312   HGBUR NEGATIVE 07/10/2017 1312   BILIRUBINUR NEGATIVE 07/10/2017 1312   BILIRUBINUR ++ 09/30/2012 1319   KETONESUR 20 (A) 07/10/2017 1312   PROTEINUR NEGATIVE 07/10/2017 1312   UROBILINOGEN 0.2 03/20/2015 1400   NITRITE NEGATIVE 07/10/2017 1312  LEUKOCYTESUR SMALL (A) 07/10/2017 1312   Sepsis Labs Invalid input(s): PROCALCITONIN,  WBC,  LACTICIDVEN Microbiology Recent Results (from the past 240 hour(s))  Urine culture     Status: Abnormal   Collection Time: 07/07/17  1:14 PM  Result Value Ref Range Status   Specimen Description   Final    URINE, CLEAN CATCH Performed at [facility], [address] [place], [place] [address]    Special Requests   Final    NONE Performed at [facility], [address], [place],  [address]    Culture MULTIPLE SPECIES PRESENT, SUGGEST RECOLLECTION (A)  Final   Report Status 07/09/2017 FINAL  Final     Time coordinating discharge: < 30 minutes  SIGNED:   Louellen Molder, MD  [facility] 07/10/2017, 3:32 PM Pager   If 7PM-7AM, please contact night-coverage [email or web address] Password [email or web address]
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
## 2017-07-10 NOTE — Progress Notes (Signed)
Patient is psychiatrically cleared per Earleen Newport, NP.Marland Kitchen  Discharge is recommended.  [email or web address]  300 floor notified.  Areatha Keas. Judi Cong, MSW, [place] Disposition Clinical Social Work [phone] (cell) [phone] (office)

## 2017-07-10 NOTE — Progress Notes (Signed)
Removed IV-clean, dry, intact. Reviewed d/c paperwork with pt, husband, and son. Reviewed new medications. Answered all questions. Josh the NT wheeled stable pt and belongings to main entrance where she got in the car with her husband.

## 2017-07-10 NOTE — Care Management Important Message (Signed)
Important Message  Patient Details  Name: [name] MRN: [id number] Date of Birth: [birth year]   Medicare Important Message Given:  Yes    Sherald Barge, RN 07/10/2017, 3:38 PM

## 2017-07-10 NOTE — Care Management Note (Signed)
Case Management Note  Patient Details  Name: [name] MRN: [id number] Date of Birth: [birth year]  Subjective/Objective:         Admitted with UTI and AMS. Pt from home with family. She has husband and very supportive son. Pt has all necessary DME pta. She has used [facility] in the past, not active PTA.            Action/Plan: DC home soon. Pt son requests [facility] be referred. Son aware HH has 48 hrs to make first visit. Blake Divine, [facility] rep, aware of referral and will pull pt info from chart.   Expected Discharge Date:    07/10/17              Expected Discharge Plan:  [place]  In-House Referral:  NA  Discharge planning Services  CM Consult  Post Acute Care Choice:  Home Health Choice offered to:  Patient  HH Arranged:  RN, PT, OT, Respirator Therapy [place] Agency:  [place]  Status of Service:  Completed, signed off  Sherald Barge, RN 07/10/2017, 1:58 PM

## 2017-07-11 ENCOUNTER — Telehealth: Payer: Self-pay | Admitting: Family Medicine

## 2017-07-11 DIAGNOSIS — I10 Essential (primary) hypertension: Secondary | ICD-10-CM | POA: Diagnosis not present

## 2017-07-11 DIAGNOSIS — R69 Illness, unspecified: Secondary | ICD-10-CM | POA: Diagnosis not present

## 2017-07-11 DIAGNOSIS — I69320 Aphasia following cerebral infarction: Secondary | ICD-10-CM | POA: Diagnosis not present

## 2017-07-11 DIAGNOSIS — R410 Disorientation, unspecified: Secondary | ICD-10-CM | POA: Diagnosis not present

## 2017-07-11 DIAGNOSIS — E785 Hyperlipidemia, unspecified: Secondary | ICD-10-CM | POA: Diagnosis not present

## 2017-07-11 DIAGNOSIS — G3184 Mild cognitive impairment, so stated: Secondary | ICD-10-CM | POA: Diagnosis not present

## 2017-07-11 DIAGNOSIS — I69990 Apraxia following unspecified cerebrovascular disease: Secondary | ICD-10-CM | POA: Diagnosis not present

## 2017-07-11 DIAGNOSIS — J449 Chronic obstructive pulmonary disease, unspecified: Secondary | ICD-10-CM | POA: Diagnosis not present

## 2017-07-11 NOTE — Telephone Encounter (Signed)
I spoke with the husband [name] whom states she had altered mental status and was recently seen in [place] and they had recommended that she be seen by a phychiatric Dr in [place] he did not want her to go that far away. I asked if there was anything I could do for her today,the husband [name] said no just set up to see Dr.Steve Luking tomorrow.

## 2017-07-11 NOTE — Telephone Encounter (Signed)
ok 

## 2017-07-11 NOTE — Telephone Encounter (Signed)
I took a call from [name] who was very upset and saying that Dr. Richardson Landry hated her and I tried explaining to her that she was very loved and Dr. Richardson Landry was not upset with her.  She kept saying that something was wrong and she needed help.  I asked her if she was in any pain and she said she wanted to go to the party where people loved her.  She then told me she was getting upset with me and she needed Dr. Richardson Landry.  I asked her to please hold so that I can have the nurse talk with her and triage her.  She hung up.  I took the information to the nurse who called her husband [name]).  We set up a hospital follow up for tomorrow with Dr. Richardson Landry.

## 2017-07-12 ENCOUNTER — Telehealth: Payer: Self-pay

## 2017-07-12 ENCOUNTER — Ambulatory Visit: Payer: Medicare HMO | Admitting: Family Medicine

## 2017-07-12 NOTE — Telephone Encounter (Signed)
BPZ:WCHENID [phone] with home health called today stating they are consulting with the pt for general assessment and help with other things like bathing around the house.

## 2017-07-14 ENCOUNTER — Telehealth: Payer: Self-pay | Admitting: Family Medicine

## 2017-07-14 NOTE — Telephone Encounter (Signed)
Front please see below --I had a long discussion with the patient's daughter who lives out of town.  Her mother is having significant dementia and behavioral issues.  I also had a discussion with [name] the patient's husband.  He states that the patient has been difficult to manage and irrational.  They are preferring not to take the patient back to the hospital.  The daughter wonders if there can be any other medications that could be used to help with behavior along with any other consultation necessary.  I spoke with the husband the patient is currently resting this afternoon he will use Xanax this evening.  Attention front staff-please put patient on the schedule for 2 PM on Monday with Dr. Richardson Landry.  Please  block off his other 2 PM slot so that he will have adequate time.  [name] was instructed to bring the patient at 1:45 PM.  he is to bring all medications with her.  He is to call if he cannot keep this appointment.  Further evaluation and potential medications at that visit

## 2017-07-15 ENCOUNTER — Ambulatory Visit (INDEPENDENT_AMBULATORY_CARE_PROVIDER_SITE_OTHER): Payer: Medicare HMO | Admitting: Family Medicine

## 2017-07-15 ENCOUNTER — Encounter: Payer: Self-pay | Admitting: Family Medicine

## 2017-07-15 VITALS — BP 136/72 | Ht 65.0 in

## 2017-07-15 DIAGNOSIS — F01518 Vascular dementia, unspecified severity, with other behavioral disturbance: Secondary | ICD-10-CM

## 2017-07-15 DIAGNOSIS — R51 Headache: Secondary | ICD-10-CM

## 2017-07-15 DIAGNOSIS — R69 Illness, unspecified: Secondary | ICD-10-CM | POA: Diagnosis not present

## 2017-07-15 DIAGNOSIS — F0151 Vascular dementia with behavioral disturbance: Secondary | ICD-10-CM | POA: Diagnosis not present

## 2017-07-15 DIAGNOSIS — R519 Headache, unspecified: Secondary | ICD-10-CM

## 2017-07-15 MED ORDER — QUETIAPINE FUMARATE ER 50 MG PO TB24: 50.0000 mg | ORAL_TABLET | Freq: Every day | ORAL | 5 refills | Status: DC

## 2017-07-15 NOTE — Progress Notes (Signed)
   Subjective:    Patient ID: [name], female    DOB: 04/02/1941, 77 y.o.   MRN: [id number]  HPI Pt in for follow up from being in hospital from 2/24-2/27. Pt daughters states that pt will not go to sleep, she is not eating or drinking, she was on the toilet and she would not get off of it. Pt daughter states that pt kept husband up all night long.  Complete hospital record  Name: Consultations and tests and records all reviewed in presence of patient and husband and daughter  Patient was admitted with altered mental status.    Challenges with actual hallucinations.  Patient has experienced this before CT MRI and MRA were all noncontributory.  No evidence of urinary tract infection.  No evidence of chest x-ray.  Psychiatry saw the patient.  Via telemetry.  They recommended involuntary commitment with inpatient psychiatric admission.  Family was resistant to this.  Thankfully the hallucinations have calm down somewhat.  Still having substantial difficulty with sleep at night.  Current medications not helping.   Review of Systems No headache, no major weight loss or weight gain, no chest pain no back pain abdominal pain no change in bowel habits complete ROS otherwise negative     Objective:   Physical Exam  Alert and oriented, vitals reviewed and stable, NAD ENT-TM's and ext canals WNL bilat via otoscopic exam Soft palate, tonsils and post pharynx WNL via oropharyngeal exam Neck-symmetric, no masses; thyroid nonpalpable and nontender Pulmonary-no tachypnea or accessory muscle use; Clear without wheezes via auscultation Card--no abnrml murmurs, rhythm reg and rate WNL Carotid pulses symmetric, without bruits       Assessment & Plan:  ImpressionDementia question vascular dementia.  With history of delirium.  Ongoing challenges with hallucination and trouble sleeping.  Atypical antipsychotics were recommended for consideration.  Due to ongoing nocturnal symptoms.  I think  it is time to recheck this with [name] discussion held with patient and family including increased risk of death on these medications.  Family would like to proceed.  Also will do physical therapy referral to try to improve strength follow-up as scheduled

## 2017-07-15 NOTE — Telephone Encounter (Signed)
Changed appt

## 2017-07-18 ENCOUNTER — Other Ambulatory Visit: Payer: Self-pay | Admitting: Family Medicine

## 2017-07-19 ENCOUNTER — Ambulatory Visit: Payer: Medicare HMO | Admitting: Family Medicine

## 2017-07-19 DIAGNOSIS — R69 Illness, unspecified: Secondary | ICD-10-CM | POA: Diagnosis not present

## 2017-07-19 DIAGNOSIS — R410 Disorientation, unspecified: Secondary | ICD-10-CM | POA: Diagnosis not present

## 2017-07-19 DIAGNOSIS — F0391 Unspecified dementia with behavioral disturbance: Secondary | ICD-10-CM | POA: Diagnosis not present

## 2017-07-19 DIAGNOSIS — I10 Essential (primary) hypertension: Secondary | ICD-10-CM | POA: Diagnosis not present

## 2017-07-19 DIAGNOSIS — G3184 Mild cognitive impairment, so stated: Secondary | ICD-10-CM | POA: Diagnosis not present

## 2017-07-31 ENCOUNTER — Ambulatory Visit: Payer: Medicare HMO | Admitting: Family Medicine

## 2017-08-07 ENCOUNTER — Telehealth: Payer: Self-pay | Admitting: Family Medicine

## 2017-08-07 MED ORDER — QUETIAPINE FUMARATE ER 50 MG PO TB24: 50.0000 mg | ORAL_TABLET | Freq: Every day | ORAL | 1 refills | Status: DC

## 2017-08-07 NOTE — Telephone Encounter (Signed)
Pharmacy faxed request for 90 day supply of Quetiapine ER 50mg ; take one tablet by mouth ever day at bedtime. Last seen 07/15/2017. Thank you

## 2017-08-07 NOTE — Telephone Encounter (Signed)
Ok plus 1 ref 

## 2017-08-07 NOTE — Telephone Encounter (Signed)
Request sent 

## 2017-08-07 NOTE — Addendum Note (Signed)
Addended by: Karle Barr on: 08/07/2017 04:46 PM   Modules accepted: Orders

## 2017-08-13 DIAGNOSIS — F0390 Unspecified dementia without behavioral disturbance: Secondary | ICD-10-CM | POA: Diagnosis not present

## 2017-08-13 DIAGNOSIS — R69 Illness, unspecified: Secondary | ICD-10-CM | POA: Diagnosis not present

## 2017-08-13 DIAGNOSIS — E538 Deficiency of other specified B group vitamins: Secondary | ICD-10-CM | POA: Diagnosis not present

## 2017-08-13 DIAGNOSIS — F419 Anxiety disorder, unspecified: Secondary | ICD-10-CM | POA: Diagnosis not present

## 2017-08-13 DIAGNOSIS — E039 Hypothyroidism, unspecified: Secondary | ICD-10-CM | POA: Diagnosis not present

## 2017-08-13 DIAGNOSIS — E669 Obesity, unspecified: Secondary | ICD-10-CM | POA: Diagnosis not present

## 2017-08-13 DIAGNOSIS — K08109 Complete loss of teeth, unspecified cause, unspecified class: Secondary | ICD-10-CM | POA: Diagnosis not present

## 2017-08-13 DIAGNOSIS — E785 Hyperlipidemia, unspecified: Secondary | ICD-10-CM | POA: Diagnosis not present

## 2017-08-13 DIAGNOSIS — G8929 Other chronic pain: Secondary | ICD-10-CM | POA: Diagnosis not present

## 2017-08-15 DIAGNOSIS — M5416 Radiculopathy, lumbar region: Secondary | ICD-10-CM | POA: Diagnosis not present

## 2017-08-15 DIAGNOSIS — G5601 Carpal tunnel syndrome, right upper limb: Secondary | ICD-10-CM | POA: Diagnosis not present

## 2017-08-15 DIAGNOSIS — R69 Illness, unspecified: Secondary | ICD-10-CM | POA: Diagnosis not present

## 2017-08-16 ENCOUNTER — Telehealth: Payer: Self-pay | Admitting: *Deleted

## 2017-08-16 NOTE — Telephone Encounter (Signed)
Last bone density 03/01/2014, pulled paper chart and pt was on fosamax since 2010 or maybe before. Did not find where she had iv meds. Oral surgeon wants to know if she can have a 3 month break to see if that will help the healing. Aware that it will be next week for a call back.

## 2017-08-16 NOTE — Telephone Encounter (Signed)
[name] from Dr Janeann Forehand (oral surgeon) calling to get some info on pt. She was seen there yesterday. She had some exposed bone in her mouth that is not healing. Teeth were extracted over 9 months ago and he thinks it should of healed by now. He wonders if it is related to her fosamax. Would like to know how long she has been on fosamax and if she has been on any iv meds for osteoporosis and when her last bone density was

## 2017-08-18 NOTE — Telephone Encounter (Signed)
Yes definitely can stop fosamax in fact for the next two years, then will repeat a bone density test then, let family kno benefit at this point is minimal and mayh elp with heling of mouth

## 2017-08-19 NOTE — Telephone Encounter (Signed)
tried calling no answer, unable to leave a message on vm

## 2017-08-20 NOTE — Telephone Encounter (Signed)
Spoke with husband. Verbalized understanding. Will put reminder in for bone density test in 2 years.

## 2017-08-27 ENCOUNTER — Ambulatory Visit (INDEPENDENT_AMBULATORY_CARE_PROVIDER_SITE_OTHER): Payer: Medicare HMO | Admitting: Family Medicine

## 2017-08-27 ENCOUNTER — Encounter: Payer: Self-pay | Admitting: Family Medicine

## 2017-08-27 VITALS — BP 142/78 | Ht 65.0 in

## 2017-08-27 DIAGNOSIS — R69 Illness, unspecified: Secondary | ICD-10-CM | POA: Diagnosis not present

## 2017-08-27 DIAGNOSIS — F0151 Vascular dementia with behavioral disturbance: Secondary | ICD-10-CM

## 2017-08-27 DIAGNOSIS — F01518 Vascular dementia, unspecified severity, with other behavioral disturbance: Secondary | ICD-10-CM

## 2017-08-27 MED ORDER — HYDROCODONE-ACETAMINOPHEN 10-325 MG PO TABS: 1.0000 | ORAL_TABLET | Freq: Four times a day (QID) | ORAL | 0 refills | Status: DC | PRN

## 2017-08-27 MED ORDER — ALPRAZOLAM 1 MG PO TABS: ORAL_TABLET | ORAL | 5 refills | Status: DC

## 2017-08-27 NOTE — Progress Notes (Signed)
   Subjective:    Patient ID: [name], female    DOB: April 01, 1941, 76 y.o.   MRN: [id number] Patient arrives for evaluation of multiple concerns, family notes that Seroquel is costly at nighttime.   Patient returns today to follow up on vascular dementia with behavior disturbance.Per husband her memory is some better.they wonder if there is a cheaper option than Seroquel.  See prior notes.  See hospital notes.  Patient having visual hallucinations.  She reports they have improved considerably with the Seroquel.  Patient wants to see if you would increase her xanax 1 mg to one whole tablet BID.  Blood pressure medicine and blood pressure levels reviewed today with patient. Compliant with blood pressure medicine. States does not miss a dose. No obvious side effects. Blood pressure generally good when checked elsewhere. Watching salt intake.  Patient continues to have challenges with her teeth.  Due to see oral surgeon soon.  Review of Systems No headache, no major weight loss or weight gain, no chest pain no back pain abdominal pain no change in bowel habits complete ROS otherwise negative     Objective:   Physical Exam  Alert and oriented, vitals reviewed and stable, NAD ENT-TM's and ext canals WNL bilat via otoscopic exam Soft palate, tonsils and post pharynx WNL via oropharyngeal exam Neck-symmetric, no masses; thyroid nonpalpable and nontender Pulmonary-no tachypnea or accessory muscle use; Clear without wheezes via auscultation Card--no abnrml murmurs, rhythm reg and rate WNL Carotid pulses symmetric, without bruits       Assessment & Plan:  Impression 1 status post.  Patient with element of vascular dementia.  Having visual hallucinations at times.  The Seroquel is definitely helped  2.  Depression clinically stable improved on medicines  3.  Hypertension good control discussed maintain same meds #4 chronic dental issues due to see Oral surgeon soon  5 history of  osteoporosis.  Oral surgeon would like [place] to hold Fosamax if all possible.  Patient has been on greater than 5 years for this is actually a good idea.  Will hold for at least next 2 years.  Greater than 50% of this 25 minute face to face visit was spent in counseling and discussion and coordination of care regarding the above diagnosis/diagnosies   Patient failed diet exercise discussed follow-up as scheduled

## 2017-08-27 NOTE — Patient Instructions (Signed)
Fosamax which is the bone strengthening medicine, do not take anymore, we will hold it for two years, and at that time w'll check another bone density test

## 2017-09-02 ENCOUNTER — Other Ambulatory Visit: Payer: Self-pay | Admitting: *Deleted

## 2017-09-02 ENCOUNTER — Telehealth: Payer: Self-pay | Admitting: *Deleted

## 2017-09-02 MED ORDER — CEPHALEXIN 500 MG PO CAPS: 500.0000 mg | ORAL_CAPSULE | Freq: Three times a day (TID) | ORAL | 0 refills | Status: DC

## 2017-09-02 NOTE — Telephone Encounter (Signed)
Discussed with pt's husband. Med sent to pharm.

## 2017-09-02 NOTE — Telephone Encounter (Signed)
Keflex 500 tid ten d 

## 2017-09-02 NOTE — Telephone Encounter (Signed)
Husband states chin had got better but started being red a couple days ago. Today having yellow drainage. Pt is running fever.   [facility] [place]

## 2017-09-09 ENCOUNTER — Telehealth: Payer: Self-pay | Admitting: Family Medicine

## 2017-09-09 MED ORDER — HYDROCODONE-ACETAMINOPHEN 10-325 MG PO TABS: 1.0000 | ORAL_TABLET | Freq: Four times a day (QID) | ORAL | 0 refills | Status: DC | PRN

## 2017-09-09 NOTE — Telephone Encounter (Signed)
That should not have happened plz rectify

## 2017-09-09 NOTE — Telephone Encounter (Signed)
Patient husband was wondering why her medication for hydrocodone was cut down from 120 to 30 a month.

## 2017-09-09 NOTE — Telephone Encounter (Signed)
Prescriptions up front for pick up . Patient's husband(POA) stated he will pick up prescriptions tomorrow and bring back the other scripts.

## 2017-09-09 NOTE — Addendum Note (Signed)
Addended by: Dairl Ponder on: 09/09/2017 03:01 PM   Modules accepted: Orders

## 2017-09-09 NOTE — Telephone Encounter (Signed)
Husband states patient normally gets 3 scripts #120 hydrocodone but at her visit a couple of weeks ago she got 3 scripts for #30 hydrocodone and he is wondering why

## 2017-10-09 ENCOUNTER — Ambulatory Visit: Payer: Medicare HMO | Admitting: Family Medicine

## 2017-10-22 ENCOUNTER — Ambulatory Visit: Payer: Medicare HMO | Admitting: Family Medicine

## 2017-11-04 ENCOUNTER — Other Ambulatory Visit: Payer: Self-pay | Admitting: Family Medicine

## 2017-11-05 DIAGNOSIS — M879 Osteonecrosis, unspecified: Secondary | ICD-10-CM | POA: Diagnosis not present

## 2017-11-05 DIAGNOSIS — R69 Illness, unspecified: Secondary | ICD-10-CM | POA: Diagnosis not present

## 2017-11-05 DIAGNOSIS — G5601 Carpal tunnel syndrome, right upper limb: Secondary | ICD-10-CM | POA: Diagnosis not present

## 2017-11-05 DIAGNOSIS — M5416 Radiculopathy, lumbar region: Secondary | ICD-10-CM | POA: Diagnosis not present

## 2017-11-06 ENCOUNTER — Telehealth: Payer: Self-pay | Admitting: Family Medicine

## 2017-11-06 MED ORDER — OXYCODONE-ACETAMINOPHEN 10-325 MG PO TABS: 1.0000 | ORAL_TABLET | Freq: Four times a day (QID) | ORAL | 0 refills | Status: DC | PRN

## 2017-11-06 NOTE — Telephone Encounter (Signed)
Pt's husband called asking if we could contact pharmacy and "ok" pt to get her pain meds refilled early  Pt had surgery yesterday& is down to only one pill  Pt's husband states that the surgeon recommended pt to contact us since we are currently prescribing her pain meds  Pt's husband says they have a prescription that he can fill on 11/11/17 but pt will run out before then   Please advise & call pt    [facility]

## 2017-11-06 NOTE — Telephone Encounter (Signed)
Due to dent surg oxycodone 10/325 numb 20 one po up to qid prn pain

## 2017-11-06 NOTE — Telephone Encounter (Signed)
Prescription up front for pick up. Husband(DPR) notified.

## 2017-11-06 NOTE — Telephone Encounter (Signed)
Hydrocodone 10/325mg  #120 one QID PRN filled 10/14/17 per pharmacy. The pharmacy states  the absolute earliest they can fill a new prescription with those directions is 11/11/17 which is 2 days early.   The patient's husband stated patient only has one pill left and has been having to take extra meds. Husband stated the dentist has to do surgery and work on a gum boil yesterday and stated patient needs to be able to fill the meds today. Please advise

## 2017-11-13 DIAGNOSIS — D229 Melanocytic nevi, unspecified: Secondary | ICD-10-CM | POA: Diagnosis not present

## 2017-11-13 DIAGNOSIS — M87180 Osteonecrosis due to drugs, jaw: Secondary | ICD-10-CM | POA: Diagnosis not present

## 2017-11-13 DIAGNOSIS — L988 Other specified disorders of the skin and subcutaneous tissue: Secondary | ICD-10-CM | POA: Diagnosis not present

## 2017-11-13 DIAGNOSIS — M879 Osteonecrosis, unspecified: Secondary | ICD-10-CM | POA: Diagnosis not present

## 2017-11-15 ENCOUNTER — Ambulatory Visit: Payer: Medicare HMO | Admitting: Nurse Practitioner

## 2017-11-19 ENCOUNTER — Ambulatory Visit (INDEPENDENT_AMBULATORY_CARE_PROVIDER_SITE_OTHER): Payer: Medicare HMO | Admitting: Nurse Practitioner

## 2017-11-19 VITALS — Ht 65.0 in

## 2017-11-19 DIAGNOSIS — Z723 Lack of physical exercise: Secondary | ICD-10-CM

## 2017-11-19 DIAGNOSIS — J300 Vasomotor rhinitis: Secondary | ICD-10-CM

## 2017-11-19 DIAGNOSIS — E639 Nutritional deficiency, unspecified: Secondary | ICD-10-CM | POA: Diagnosis not present

## 2017-11-20 ENCOUNTER — Encounter: Payer: Self-pay | Admitting: Nurse Practitioner

## 2017-11-20 NOTE — Progress Notes (Signed)
Subjective: Presents with her daughter.  Is unsure the actual reason for the visit.  Patient denies any hemorrhoids.  Was having some constipation probably due to her pain medicine, took some magnesium citrate, this resolved.  No blood in her stools.  Had a soft BM this morning.  Her husband handles all of her medications, it is not clear at this point what she is taking for pain.  Stays in a constant state of some level of pain which is improved after taking pain medication.  Daughter says that she ate a small amount of breakfast this morning.  Does not like to drink any milk-based protein supplements so her daughter has purchased some protein that can be mixed with clear liquid.  Very limited activity.  Can use her walker and also her husband can help her ambulate.  Complaints of pressure and difficulty hearing more in the right ear.  No fever.  No sinus headache.  No color to any drainage.  Objective:   Ht 5\' 5"  (1.651 m)   BMI 24.80 kg/m  NAD.  Alert, oriented.  TMs retracted bilateral, more on the right.  No erythema.  Pharynx minimal erythema with cloudy PND noted.  Neck supple with mild soft anterior adenopathy.  Lungs clear.  Heart regular rate and rhythm.  Patient examined in the wheelchair.  Her weight last year was 180 pounds, last weight in February was 149 pounds.  Assessment:  Vasomotor rhinitis  Poor nutrition  Inactivity    Plan: Her daughter will check to see what medications she is currently on specifically pain medication.  OTC meds as directed for head congestion.  Strongly encouraged dietary supplement with extra protein, note patient's GFR is normal.  Strongly encouraged regular activity walking around the house but cautioned about potential for falls.  Follow-up on 7/17 with Dr. Richardson Landry as planned, call back sooner if needed.

## 2017-11-27 ENCOUNTER — Ambulatory Visit: Payer: Medicare HMO | Admitting: Family Medicine

## 2017-12-02 ENCOUNTER — Other Ambulatory Visit: Payer: Self-pay | Admitting: Family Medicine

## 2017-12-02 ENCOUNTER — Ambulatory Visit: Payer: Medicare HMO | Admitting: Family Medicine

## 2017-12-04 ENCOUNTER — Encounter: Payer: Self-pay | Admitting: Family Medicine

## 2017-12-04 ENCOUNTER — Ambulatory Visit (INDEPENDENT_AMBULATORY_CARE_PROVIDER_SITE_OTHER): Payer: Medicare HMO | Admitting: Family Medicine

## 2017-12-04 VITALS — BP 128/82 | Ht 65.0 in

## 2017-12-04 DIAGNOSIS — F0151 Vascular dementia with behavioral disturbance: Secondary | ICD-10-CM | POA: Diagnosis not present

## 2017-12-04 DIAGNOSIS — I10 Essential (primary) hypertension: Secondary | ICD-10-CM

## 2017-12-04 DIAGNOSIS — M545 Low back pain, unspecified: Secondary | ICD-10-CM

## 2017-12-04 DIAGNOSIS — G8929 Other chronic pain: Secondary | ICD-10-CM

## 2017-12-04 DIAGNOSIS — R69 Illness, unspecified: Secondary | ICD-10-CM | POA: Diagnosis not present

## 2017-12-04 DIAGNOSIS — F01518 Vascular dementia, unspecified severity, with other behavioral disturbance: Secondary | ICD-10-CM

## 2017-12-04 MED ORDER — HYDROCODONE-ACETAMINOPHEN 10-325 MG PO TABS: 1.0000 | ORAL_TABLET | Freq: Four times a day (QID) | ORAL | 0 refills | Status: DC | PRN

## 2017-12-04 NOTE — Progress Notes (Signed)
   Subjective:    Patient ID: [name], female    DOB: [birth year], 77 y.o.   MRN: [id number]  Hypertension  This is a chronic problem. The current episode started more than 1 year ago. Risk factors for coronary artery disease include dyslipidemia and post-menopausal state. There are no compliance problems.    Blood pressure medicine and blood pressure levels reviewed today with patient. Compliant with blood pressure medicine. States does not miss a dose. No obvious side effects. Blood pressure generally good when checked elsewhere. Watching salt intake.   Patient notes ongoing compliance with antidepressant medication. No obvious side effects. Reports does not miss a dose. Overall continues to help depression substantially. No thoughts of homicide or suicide. Would like to maintain medication.  Patient continues to take lipid medication regularly. No obvious side effects from it. Generally does not miss a dose. Prior blood work results are reviewed with patient. Patient continues to work on fat intake in diet  Patient compliant with pain medication. Continues to experience the pain which led to initiation of analgesic intervention. No significant negative side effects. States definitely needs the pain medication to maintain current level of functioning. Does not receive controlled substance pain medication elsewhere.    Review of Systems No headache, no major weight loss or weight gain, no chest pain no back pain abdominal pain no change in bowel habits complete ROS otherwise negative     Objective:   Physical Exam  Alert and oriented, vitals reviewed and stable, NAD ENT-TM's and ext canals WNL bilat via otoscopic exam Soft palate, tonsils and post pharynx WNL via oropharyngeal exam Neck-symmetric, no masses; thyroid nonpalpable and nontender Pulmonary-no tachypnea or accessory muscle use; Clear without wheezes via auscultation Card--no abnrml murmurs, rhythm reg and rate  WNL Carotid pulses symmetric, without bruits Impression     Assessment & Plan:  1 impression depression complicated by dementia.  Positive history of hallucinations.  Currently patient compliant with medications which thankfully have controlled her hallucinations.  Overall mood is improved also per family  Impression: Chronic pain. Patient compliant with medication. No substantial side effects. [place] controlled substance registry reviewed to ensure compliance and proper use of medication. Patient aware goal of medicine is not complete resolution of pain but to control his symptoms to improve his functional capacity. Aware of potential adverse side effects  #3 hypertension.  Good control discussed to maintain same meds  4.  Hyperlipidemia.  Prior blood work reviewed importance of compliance discussed patient to maintain the same  Follow-up in 3 months diet exercise discussed

## 2017-12-15 ENCOUNTER — Other Ambulatory Visit: Payer: Self-pay | Admitting: Family Medicine

## 2018-01-16 ENCOUNTER — Other Ambulatory Visit: Payer: Self-pay | Admitting: Family Medicine

## 2018-01-21 ENCOUNTER — Encounter: Payer: Self-pay | Admitting: Family Medicine

## 2018-01-21 ENCOUNTER — Ambulatory Visit (INDEPENDENT_AMBULATORY_CARE_PROVIDER_SITE_OTHER): Payer: Medicare HMO | Admitting: Family Medicine

## 2018-01-21 VITALS — BP 120/72 | Ht 65.0 in

## 2018-01-21 DIAGNOSIS — F0151 Vascular dementia with behavioral disturbance: Secondary | ICD-10-CM | POA: Diagnosis not present

## 2018-01-21 DIAGNOSIS — F01518 Vascular dementia, unspecified severity, with other behavioral disturbance: Secondary | ICD-10-CM

## 2018-01-21 DIAGNOSIS — R69 Illness, unspecified: Secondary | ICD-10-CM | POA: Diagnosis not present

## 2018-01-21 NOTE — Progress Notes (Signed)
   Subjective:    Patient ID: [name], female    DOB: June 13, 1940, 77 y.o.   MRN: [id number]  HPI Patient arrives stating the balls she has been feeling under her skin is back.  Patient returns with daughter for an extensive discussion.  Patient's daughter is a [employer] scientist.  She comes up from [place] every month or 2 to help her mom.  She is very concerned about her mom's challenges.  To reiterate earlier this spring patient was in the hospital.  MRA and MRI revealed old temporal lobe infarct.  There is also substantial chronic microvascular disease.  Multiple referrals were held.  Patient was experiencing visual and auditory hallucinations very severe in nature.  Psychiatry involuntary commitment was advised family was reluctant to do this in fact opposed.  Patient came home.  Presents today with ongoing challenges.  We started an atypical antipsychotic this spring.  It did seem to substantially help the hallucinations.  Now patient having challenges with feeling "balls" under the skin.  Often filling them out her face ears neck.  Sometimes arms.  No one else can feel beats      Review of Systems No headache, no major weight loss or weight gain, no chest pain no back pain abdominal pain no change in bowel habits complete ROS otherwise negative     Objective:   Physical Exam Alert and oriented, vitals reviewed and stable, NAD ENT-TM's and ext canals WNL bilat via otoscopic exam Soft palate, tonsils and post pharynx WNL via oropharyngeal exam Neck-symmetric, no masses; thyroid nonpalpable and nontender Pulmonary-no tachypnea or accessory muscle use; Clear without wheezes via auscultation Card--no abnrml murmurs, rhythm reg and rate WNL Carotid pulses symmetric, without bruits Patient appears alert oriented.  She is diffusely weak.  Requiring assistance to move about.       Assessment & Plan:  Impression dementia.  With once again active hallucinations though not as  disabling as this spring.  Question if secondary to prior infarcts.  Also with neuromuscular weakness along with hallucinations wonder about something like Lewy body disease or frequent.  Overall has improved considerably with antipsychotic, however now experiencing more hallucinations we will start with an neurology referral.  Rationale described with family.  Further recommendations based on their analysis.  Would stay with the same dose of medication for now.  Greater than 50% of this 40 minute face to face visit was spent in counseling and discussion and coordination of care regarding the above diagnosis/diagnosies

## 2018-01-22 ENCOUNTER — Other Ambulatory Visit: Payer: Self-pay | Admitting: Family Medicine

## 2018-01-24 ENCOUNTER — Other Ambulatory Visit: Payer: Self-pay | Admitting: Family Medicine

## 2018-01-24 DIAGNOSIS — F0151 Vascular dementia with behavioral disturbance: Secondary | ICD-10-CM

## 2018-01-24 DIAGNOSIS — F05 Delirium due to known physiological condition: Secondary | ICD-10-CM

## 2018-01-24 DIAGNOSIS — R410 Disorientation, unspecified: Secondary | ICD-10-CM

## 2018-01-24 DIAGNOSIS — F0150 Vascular dementia without behavioral disturbance: Secondary | ICD-10-CM

## 2018-01-24 DIAGNOSIS — R443 Hallucinations, unspecified: Secondary | ICD-10-CM

## 2018-01-24 DIAGNOSIS — Z8673 Personal history of transient ischemic attack (TIA), and cerebral infarction without residual deficits: Secondary | ICD-10-CM

## 2018-01-30 ENCOUNTER — Encounter: Payer: Self-pay | Admitting: Neurology

## 2018-02-03 ENCOUNTER — Telehealth: Payer: Self-pay | Admitting: Family Medicine

## 2018-02-03 MED ORDER — CEPHALEXIN 500 MG PO CAPS: 500.0000 mg | ORAL_CAPSULE | Freq: Three times a day (TID) | ORAL | 0 refills | Status: DC

## 2018-02-03 NOTE — Telephone Encounter (Signed)
Pt's husband thinks his wife's infection has came back at surgery site on chin, pt's husband would like to speak with nurse regarding the matter. Please advise and inform pt of further.

## 2018-02-03 NOTE — Telephone Encounter (Signed)
Prescription sent electronically to pharmacy. Husband (DPR) notified. 

## 2018-02-03 NOTE — Telephone Encounter (Signed)
Left message to return call to get additional information °

## 2018-02-03 NOTE — Telephone Encounter (Signed)
Keflex 500 tid ten d 

## 2018-02-03 NOTE — Telephone Encounter (Signed)
Husband(DPR) stated that the place on her chin is red again and he cant even come near it with out her screaming in pain. Husband states it looks exactly like it looked last time it was infected. Patient has no fever per husband. No available appts today. Please advise.

## 2018-02-05 ENCOUNTER — Other Ambulatory Visit: Payer: Self-pay | Admitting: Family Medicine

## 2018-02-25 ENCOUNTER — Other Ambulatory Visit: Payer: Self-pay | Admitting: Family Medicine

## 2018-03-06 ENCOUNTER — Ambulatory Visit (INDEPENDENT_AMBULATORY_CARE_PROVIDER_SITE_OTHER): Payer: Medicare HMO | Admitting: Family Medicine

## 2018-03-06 ENCOUNTER — Encounter: Payer: Self-pay | Admitting: Family Medicine

## 2018-03-06 VITALS — BP 118/74 | Ht 65.0 in

## 2018-03-06 DIAGNOSIS — F0151 Vascular dementia with behavioral disturbance: Secondary | ICD-10-CM

## 2018-03-06 DIAGNOSIS — F01518 Vascular dementia, unspecified severity, with other behavioral disturbance: Secondary | ICD-10-CM

## 2018-03-06 DIAGNOSIS — R51 Headache: Secondary | ICD-10-CM

## 2018-03-06 DIAGNOSIS — Z23 Encounter for immunization: Secondary | ICD-10-CM

## 2018-03-06 DIAGNOSIS — I10 Essential (primary) hypertension: Secondary | ICD-10-CM | POA: Diagnosis not present

## 2018-03-06 DIAGNOSIS — Z8673 Personal history of transient ischemic attack (TIA), and cerebral infarction without residual deficits: Secondary | ICD-10-CM | POA: Diagnosis not present

## 2018-03-06 DIAGNOSIS — R69 Illness, unspecified: Secondary | ICD-10-CM | POA: Diagnosis not present

## 2018-03-06 DIAGNOSIS — R519 Headache, unspecified: Secondary | ICD-10-CM

## 2018-03-06 MED ORDER — HYDROCODONE-ACETAMINOPHEN 10-325 MG PO TABS: 1.0000 | ORAL_TABLET | Freq: Four times a day (QID) | ORAL | 0 refills | Status: DC | PRN

## 2018-03-06 NOTE — Progress Notes (Signed)
   Subjective:    Patient ID: [name], female    DOB: 04/08/1941, 77 y.o.   MRN: [id number]  Hypertension  This is a chronic problem. Compliance problems: takes meds every day.    Pt states she feels "balls" under her skin all over her body. States she did not go to specialist that she was referred to about this issue.,  However patient's husband contradicts her somewhat and says the neurology referral is still a head   "balls" under the skin still have all over just about  Feels all over face and eyes and nos Would like flu vaccine.   Still due to see neurologist soon.  Blood pressure medicine and blood pressure levels reviewed today with patient. Compliant with blood pressure medicine. States does not miss a dose. No obvious side effects. Blood pressure generally good when checked elsewhere. Watching salt intake.  Please see prior notes.  Some of the more severe auditory and visual hallucinations earlier this year are still considerably better.  Patient continues to have unsteadiness with ambulation.  Patient compliant with insomnia medication. Generally takes most nights. No obvious morning drowsiness. Definitely helps patient sleep. Without it patient states would not get a good nights rest.  Patient compliant with pain medication. Continues to experience the pain which led to initiation of analgesic intervention. No significant negative side effects. States definitely needs the pain medication to maintain current level of functioning. Does not receive controlled substance pain medication elsewhere.  Review of Systems No headache, no major weight loss or weight gain, no chest pain no back pain abdominal pain no change in bowel habits complete ROS otherwise negative     Objective:   Physical Exam  Alert and oriented, vitals reviewed and stable, NAD ENT-TM's and ext canals WNL bilat via otoscopic exam Soft palate, tonsils and post pharynx WNL via oropharyngeal  exam Neck-symmetric, no masses; thyroid nonpalpable and nontender Pulmonary-no tachypnea or accessory muscle use; Clear without wheezes via auscultation Card--no abnrml murmurs, rhythm reg and rate WNL Carotid pulses symmetric, without bruits       Assessment & Plan:  Impression #1 ongoing hallucinations though substantially improved question if related to #2 and #3  2.  Status post stroke with persistent a aphasia  3.  Element of dementia with history of delirium in the past.  Continued hallucinatory component.  4.  Hypertension good control discussed to maintain same intervention  5.  Insomnia ongoing need for medications discussed  6.  Chronic pain ongoing  Impression: Chronic pain. Patient compliant with medication. No substantial side effects. [place] controlled substance registry reviewed to ensure compliance and proper use of medication. Patient aware goal of medicine is not complete resolution of pain but to control his symptoms to improve his functional capacity. Aware of potential adverse side effects   Flu shot today.  Still await input from neurologist as whether her hallucinations may be related to neurological etiology.  Of note once again in the past patient declined along with family a psychiatric admission for exacerbation of hallucinations  Greater than 50% of this 25 minute face to face visit was spent in counseling and discussion and coordination of care regarding the above diagnosis/diagnosies

## 2018-03-11 ENCOUNTER — Other Ambulatory Visit: Payer: Self-pay | Admitting: Family Medicine

## 2018-03-11 ENCOUNTER — Telehealth: Payer: Self-pay | Admitting: *Deleted

## 2018-03-11 ENCOUNTER — Other Ambulatory Visit: Payer: Self-pay | Admitting: *Deleted

## 2018-03-11 MED ORDER — ALPRAZOLAM 1 MG PO TABS: ORAL_TABLET | ORAL | 5 refills | Status: DC

## 2018-03-11 NOTE — Telephone Encounter (Signed)
Pt completely out of xanax. Last seen 03/06/18

## 2018-03-11 NOTE — Telephone Encounter (Signed)
[facility]pt notified.

## 2018-03-11 NOTE — Telephone Encounter (Signed)
Ricky calling in checking on pt's medication refill. He states she is out of her medication.

## 2018-03-11 NOTE — Telephone Encounter (Signed)
Ok plus 5 monthly ref 

## 2018-03-18 ENCOUNTER — Other Ambulatory Visit: Payer: Self-pay | Admitting: Family Medicine

## 2018-03-28 ENCOUNTER — Ambulatory Visit: Payer: Self-pay | Admitting: Neurology

## 2018-04-03 ENCOUNTER — Telehealth: Payer: Self-pay | Admitting: Family Medicine

## 2018-04-03 NOTE — Telephone Encounter (Signed)
[facility] calling regarding the medical clearance they faxed over and also wanting to know if Ms. [name] has ever received IV Bisthosphomate or prolia medications before. CB# [phone]

## 2018-04-03 NOTE — Telephone Encounter (Signed)
Contacted [facility] and informed Jessica(receptionist) that we would be faxing over the form on patient. Faxed form to [facility].

## 2018-04-06 ENCOUNTER — Other Ambulatory Visit: Payer: Self-pay | Admitting: Family Medicine

## 2018-04-28 ENCOUNTER — Other Ambulatory Visit: Payer: Self-pay | Admitting: Family Medicine

## 2018-04-30 ENCOUNTER — Other Ambulatory Visit: Payer: Self-pay | Admitting: Family Medicine

## 2018-05-03 ENCOUNTER — Other Ambulatory Visit: Payer: Self-pay | Admitting: Family Medicine

## 2018-05-27 ENCOUNTER — Telehealth: Payer: Self-pay | Admitting: Family Medicine

## 2018-05-27 NOTE — Telephone Encounter (Signed)
Reviewed formulary. Notify pts spouse that the alternative suggested by his insur co are  Bad for her, and would cause more side effects, he will need to purchase this on his own

## 2018-05-27 NOTE — Telephone Encounter (Signed)
Fax from [facility] stating that a 30 day supply of Hydroxyzine 25 mg tablets would be given but in order to continue this med, a PA would need to be completed. Tried to attempt PA over phone. Pharmacist states that an alternative such ad Duloxetine, Cymbalta would need to be tried and failed first. I have placed fax in provider office just in case you would like to review. Please advise. Thank you

## 2018-05-28 NOTE — Telephone Encounter (Signed)
Spoke with patient spouse and informed that they would need to pay out of pocket for Hydroxyzine 25 mg tablets due to insurance denying the PA. Pt spouse verbalized understanding

## 2018-06-04 ENCOUNTER — Encounter: Payer: Self-pay | Admitting: Family Medicine

## 2018-06-04 ENCOUNTER — Ambulatory Visit (INDEPENDENT_AMBULATORY_CARE_PROVIDER_SITE_OTHER): Payer: Medicare HMO | Admitting: Family Medicine

## 2018-06-04 VITALS — BP 142/80 | Ht 65.0 in

## 2018-06-04 DIAGNOSIS — R079 Chest pain, unspecified: Secondary | ICD-10-CM | POA: Diagnosis not present

## 2018-06-04 DIAGNOSIS — I10 Essential (primary) hypertension: Secondary | ICD-10-CM

## 2018-06-04 DIAGNOSIS — Z79891 Long term (current) use of opiate analgesic: Secondary | ICD-10-CM | POA: Diagnosis not present

## 2018-06-04 DIAGNOSIS — Z79899 Other long term (current) drug therapy: Secondary | ICD-10-CM | POA: Diagnosis not present

## 2018-06-04 MED ORDER — LEVOTHYROXINE SODIUM 112 MCG PO TABS: 112.0000 ug | ORAL_TABLET | Freq: Every day | ORAL | 1 refills | Status: AC

## 2018-06-04 MED ORDER — PRAVASTATIN SODIUM 20 MG PO TABS: 20.0000 mg | ORAL_TABLET | Freq: Every day | ORAL | 1 refills | Status: AC

## 2018-06-04 MED ORDER — HYDROCODONE-ACETAMINOPHEN 10-325 MG PO TABS: ORAL_TABLET | ORAL | 0 refills | Status: DC

## 2018-06-04 MED ORDER — HYDROCODONE-ACETAMINOPHEN 10-325 MG PO TABS: 1.0000 | ORAL_TABLET | Freq: Four times a day (QID) | ORAL | 0 refills | Status: DC | PRN

## 2018-06-04 MED ORDER — ALPRAZOLAM 1 MG PO TABS: ORAL_TABLET | ORAL | 5 refills | Status: DC

## 2018-06-04 MED ORDER — HYDROXYZINE HCL 25 MG PO TABS: ORAL_TABLET | ORAL | 5 refills | Status: DC

## 2018-06-04 MED ORDER — PAROXETINE HCL 40 MG PO TABS: 40.0000 mg | ORAL_TABLET | Freq: Every day | ORAL | 1 refills | Status: AC

## 2018-06-04 NOTE — Progress Notes (Signed)
Subjective:    Patient ID: [name], female    DOB: 1941/01/16, 78 y.o.   MRN: [id number]  HPI Patient is here today to follow up on her chronic health issues.  She has a history of Hypertension and is not on any but pt states occasionally takes Lisinopril if bp is elevated.  History of anxiety and depression she is taking Paxil 40 mg per day,xanax 1 mg 1/2 prn and 1 po Qhs,Seroquel xr one po Qhs.  Also here today for pain Management.  This patient was seen today for chronic pain  The medication list was reviewed and updated.   -Compliance with medictation: Hydrocodone 10-325 mg one po Q 6 hours prn (2-4 per day)   -when was the last dose patient took? 11 am this am.   The patient was advised the importance of maintaining medication and not using illegal substances with these.  Here for refills and follow up  The patient was educated that we can provide 3 monthly scripts for their medication, it is their responsibility to follow the instructions.  Side effects or complications from medications: None  Patient is aware that pain medications are meant to minimize the severity of the pain to allow their pain levels to improve to allow for better function. They are aware of that pain medications cannot totally remove their pain.  Due for UDT ( at least once per year) : Due today,but can not go to the restroom per family.  Per Daughter Rise Patience patient's atarax 25 mg was not approved by insurance and wants to know if there is an alternative.  Impression: Chronic pain. Patient compliant with medication. No substantial side effects. [place] controlled substance registry reviewed to ensure compliance and proper use of medication. Patient aware goal of medicine is not complete resolution of pain but to control his symptoms to improve his functional capacity. Aware of potential adverse side effects   Patient is also at times mention chest discomfort.  She notes that  it seems to occur after a meal.  No major history of reflux.  No noticeable nausea or diaphoresis or shortness of breath.  Pain not associated with exertion.  Somewhat challenging to assess because patient always is in an element of pain, along with difficulty expressing her situation.  Review of Systems No headache, no major weight loss or weight gain, no chest pain no back pain abdominal pain no change in bowel habits complete ROS otherwise negative     Objective:   Physical Exam  Alert, speech impediment persists though overall improved compared to last year.  Word selection better.  No acute distress.  No active hallucinations., vitals reviewed and stable, NAD ENT-TM's and ext canals WNL bilat via otoscopic exam Soft palate, tonsils and post pharynx WNL via oropharyngeal exam Neck-symmetric, no masses; thyroid nonpalpable and nontender Pulmonary-no tachypnea or accessory muscle use; Clear without wheezes via auscultation Card--no abnrml murmurs, rhythm reg and rate WNL Carotid pulses symmetric, without bruits  EKG normal sinus rhythm.  No significant ST-T changes.     Assessment & Plan:  Patient history of dementia associated with hallucinations.  Patient has been reluctant to go on to a neurologist.  Also complicated by agitation at times.  The current combination of medicine seems to be working well for patient and family  2.  Chest pain nonspecific EKG within normal limits.  Discussion held.  Family wishes to hold off on major work-up at this time which is certainly reasonable.  Of note they have also made clearAnd therefore they did not care to see a neurologist at this time  3.  Chronic pain.  Discussed.  Medications refilled  4.  Hypertension.  Good control discussed to maintain same meds  Patient compliant with pain medication. Continues to experience the pain which led to initiation of analgesic intervention. No significant negative side effects. States definitely needs the  pain medication to maintain current level of functioning. Does not receive controlled substance pain medication elsewhere. Impression: Chronic pain. Patient compliant with medication. No substantial side effects. [place] controlled substance registry reviewed to ensure compliance and proper use of medication. Patient aware goal of medicine is not complete resolution of pain but to control his symptoms to improve his functional capacity. Aware of potential adverse side effects  Time for blood work discussed we will press on further recommendations based on results  Greater than 50% of this 40 minute face to face visit was spent in counseling and discussion and coordination of care regarding the above diagnosis/diagnosies

## 2018-06-05 ENCOUNTER — Other Ambulatory Visit: Payer: Self-pay | Admitting: *Deleted

## 2018-06-05 DIAGNOSIS — Z79891 Long term (current) use of opiate analgesic: Secondary | ICD-10-CM

## 2018-06-05 LAB — BASIC METABOLIC PANEL
BUN/Creatinine Ratio: 22 (ref 12–28)
BUN: 15 mg/dL (ref 8–27)
CO2: 23 mmol/L (ref 20–29)
Calcium: 9.2 mg/dL (ref 8.7–10.3)
Chloride: 105 mmol/L (ref 96–106)
Creatinine, Ser: 0.67 mg/dL (ref 0.57–1.00)
GFR calc Af Amer: 97 mL/min/{1.73_m2} (ref >59)
GFR, EST NON AFRICAN AMERICAN: 84 mL/min/{1.73_m2} (ref >59)
Glucose: 85 mg/dL (ref 65–99)
Potassium: 5.1 mmol/L (ref 3.5–5.2)
Sodium: 142 mmol/L (ref 134–144)

## 2018-06-05 LAB — LIPID PANEL
Chol/HDL Ratio: 2.4 ratio (ref 0.0–4.4)
Cholesterol, Total: 154 mg/dL (ref 100–199)
HDL: 63 mg/dL (ref >39)
LDL Calculated: 70 mg/dL (ref 0–99)
Triglycerides: 105 mg/dL (ref 0–149)
VLDL CHOLESTEROL CAL: 21 mg/dL (ref 5–40)

## 2018-06-05 LAB — CBC WITH DIFFERENTIAL/PLATELET
Basophils Absolute: 0.1 10*3/uL (ref 0.0–0.2)
Basos: 1 %
EOS (ABSOLUTE): 0.1 10*3/uL (ref 0.0–0.4)
Eos: 2 %
HEMOGLOBIN: 10.2 g/dL — AB (ref 11.1–15.9)
Hematocrit: 32.7 % — ABNORMAL LOW (ref 34.0–46.6)
IMMATURE GRANULOCYTES: 0 %
Immature Grans (Abs): 0 10*3/uL (ref 0.0–0.1)
Lymphocytes Absolute: 2.3 10*3/uL (ref 0.7–3.1)
Lymphs: 44 %
MCH: 27.1 pg (ref 26.6–33.0)
MCHC: 31.2 g/dL — ABNORMAL LOW (ref 31.5–35.7)
MCV: 87 fL (ref 79–97)
Monocytes Absolute: 0.4 10*3/uL (ref 0.1–0.9)
Monocytes: 7 %
NEUTROS PCT: 46 %
Neutrophils Absolute: 2.3 10*3/uL (ref 1.4–7.0)
Platelets: 263 10*3/uL (ref 150–450)
RBC: 3.77 x10E6/uL (ref 3.77–5.28)
RDW: 16.5 % — ABNORMAL HIGH (ref 11.7–15.4)
WBC: 5.2 10*3/uL (ref 3.4–10.8)

## 2018-06-05 LAB — HEPATIC FUNCTION PANEL
ALT: 15 IU/L (ref 0–32)
AST: 26 IU/L (ref 0–40)
Albumin: 4.2 g/dL (ref 3.7–4.7)
Alkaline Phosphatase: 60 IU/L (ref 39–117)
Bilirubin Total: 0.3 mg/dL (ref 0.0–1.2)
Bilirubin, Direct: 0.12 mg/dL (ref 0.00–0.40)
Total Protein: 6.3 g/dL (ref 6.0–8.5)

## 2018-06-06 ENCOUNTER — Encounter: Payer: Self-pay | Admitting: Family Medicine

## 2018-06-10 LAB — SPECIMEN STATUS REPORT

## 2018-06-10 LAB — TOXASSURE SELECT 13 (MW), URINE

## 2018-06-13 ENCOUNTER — Telehealth: Payer: Self-pay | Admitting: Family Medicine

## 2018-06-13 ENCOUNTER — Other Ambulatory Visit: Payer: Self-pay | Admitting: Family Medicine

## 2018-06-13 MED ORDER — HYDROCODONE-ACETAMINOPHEN 10-325 MG PO TABS: 1.0000 | ORAL_TABLET | Freq: Four times a day (QID) | ORAL | 0 refills | Status: DC | PRN

## 2018-06-13 NOTE — Telephone Encounter (Signed)
I spoke with CorrinePharmacist at [facility] she states patient last got # 120 on January 02,2020.

## 2018-06-13 NOTE — Telephone Encounter (Signed)
Patient would like medication sent to [facility] [place] [id number].

## 2018-06-13 NOTE — Telephone Encounter (Signed)
Unable to fill medication at pharmacy due to no supply, requesting paper copy of script to have picked up.   HYDROcodone-acetaminophen (NORCO) 10-325 MG tablet

## 2018-06-13 NOTE — Telephone Encounter (Signed)
[name] patients husband is aware.

## 2018-06-13 NOTE — Telephone Encounter (Signed)
Contacted patient, advised patient to see which pharmacy has med in stock, patient is going to call around to see which pharmacy has medication in stock and return call with pharmacy.

## 2018-06-13 NOTE — Telephone Encounter (Signed)
The prescription was sent to [facility] in [place] to the registrar it can be filled on February 1 they ought to call today so they can pick it up on Saturday

## 2018-06-15 ENCOUNTER — Encounter: Payer: Self-pay | Admitting: Family Medicine

## 2018-06-23 ENCOUNTER — Telehealth: Payer: Self-pay | Admitting: Family Medicine

## 2018-06-23 NOTE — Telephone Encounter (Signed)
ok 

## 2018-06-23 NOTE — Telephone Encounter (Signed)
I thought we went thru all of this before, I advised the family to purchase and it was not that much

## 2018-06-23 NOTE — Telephone Encounter (Signed)
Fax from pharmacy stating that Hydroxyzine 25 mg tablets has been identified as plan limitations by medicare part D benefit. Alternatives include Hydroxyzine 10 mg/5 ml solution or Hydroxyzine PAM 25 mg capsules. We can attempt a PA or change to an alternative. Please advise. Thank you

## 2018-06-23 NOTE — Telephone Encounter (Signed)
My apolgies; looking in chart we have spoke with husband in the past regarding this. Contacted husband [name] and he states that they are paying out of pocket for Hydroxyzine.

## 2018-08-01 ENCOUNTER — Other Ambulatory Visit: Payer: Self-pay | Admitting: Family Medicine

## 2018-08-07 ENCOUNTER — Other Ambulatory Visit: Payer: Self-pay | Admitting: Family Medicine

## 2018-08-15 ENCOUNTER — Other Ambulatory Visit: Payer: Self-pay | Admitting: Family Medicine

## 2018-08-16 NOTE — Telephone Encounter (Signed)
6 mo ok

## 2018-08-25 ENCOUNTER — Other Ambulatory Visit: Payer: Self-pay | Admitting: Family Medicine

## 2018-08-27 ENCOUNTER — Other Ambulatory Visit: Payer: Self-pay | Admitting: Family Medicine

## 2018-09-03 ENCOUNTER — Other Ambulatory Visit: Payer: Self-pay

## 2018-09-03 ENCOUNTER — Ambulatory Visit (INDEPENDENT_AMBULATORY_CARE_PROVIDER_SITE_OTHER): Payer: Medicare HMO | Admitting: Family Medicine

## 2018-09-03 DIAGNOSIS — M545 Low back pain: Secondary | ICD-10-CM

## 2018-09-03 DIAGNOSIS — G8929 Other chronic pain: Secondary | ICD-10-CM | POA: Diagnosis not present

## 2018-09-03 DIAGNOSIS — R69 Illness, unspecified: Secondary | ICD-10-CM | POA: Diagnosis not present

## 2018-09-03 DIAGNOSIS — M797 Fibromyalgia: Secondary | ICD-10-CM

## 2018-09-03 DIAGNOSIS — I10 Essential (primary) hypertension: Secondary | ICD-10-CM

## 2018-09-03 DIAGNOSIS — F01518 Vascular dementia, unspecified severity, with other behavioral disturbance: Secondary | ICD-10-CM

## 2018-09-03 DIAGNOSIS — F0151 Vascular dementia with behavioral disturbance: Secondary | ICD-10-CM

## 2018-09-03 NOTE — Progress Notes (Signed)
Subjective:    Patient ID: [name], female    DOB: 09/14/1940, 78 y.o.   MRN: [id number]  HPI This patient was seen today for chronic pain  The medication list was reviewed and updated.   -Compliance with medication: yes  - Number patient states they take daily: 4-5  -when was the last dose patient took? today  The patient was advised the importance of maintaining medication and not using illegal substances with these.  Here for refills and follow up  The patient was educated that we can provide 3 monthly scripts for their medication, it is their responsibility to follow the instructions.  Side effects or complications from medications: none  Patient is aware that pain medications are meant to minimize the severity of the pain to allow their pain levels to improve to allow for better function. They are aware of that pain medications cannot totally remove their pain.   Husband states patient's pain has worsened and he is having to give hher extra pain pills each day and he is wondering if the number she gets each month can be increased.   Virtual Visit via Video Note  I connected with [name] on 09/03/18 at  2:00 PM EDT by a video enabled telemedicine application and verified that I am speaking with the correct person using two identifiers.   I discussed the limitations of evaluation and management by telemedicine and the availability of in person appointments. The patient expressed understanding and agreed to proceed.  History of Present Illness:    Observations/Objective:   Assessment and Plan:   Follow Up Instructions:    I discussed the assessment and treatment plan with the patient. The patient was provided an opportunity to ask questions and all were answered. The patient agreed with the plan and demonstrated an understanding of the instructions.   The patient was advised to call back or seek an in-person evaluation if the symptoms worsen  or if the condition fails to improve as anticipated.  I provided 52minutes of non-face-to-face time during this encounter.     Blood pressure medicine and blood pressure levels reviewed today with patient. Compliant with blood pressure medicine. States does not miss a dose. No obvious side effects. Blood pressure generally good when checked elsewhere. Watching salt intake.   Patient continues to take lipid medication regularly. No obvious side effects from it. Generally does not miss a dose. Prior blood work results are reviewed with patient. Patient continues to work on fat intake in diet       Review of Systems No headache, no major weight loss or weight gain, no chest pain no back pain abdominal pain no change in bowel habits complete ROS otherwise negative     Objective:   Physical Exam  Alert and oriented, vitals reviewed and stable, NAD ENT-TM's and ext canals WNL bilat via otoscopic exam Soft palate, tonsils and post pharynx WNL via oropharyngeal exam Neck-symmetric, no masses; thyroid nonpalpable and nontender Pulmonary-no tachypnea or accessory muscle use; Clear without wheezes via auscultation Card--no abnrml murmurs, rhythm reg and rate WNL Carotid pulses symmetric, without bruits       Assessment & Plan:  Impression: Chronic pain. Patient compliant with medication. No substantial side effects. [place] controlled substance registry reviewed to ensure compliance and proper use of medication. Patient aware goal of medicine is not complete resolution of pain but to control his symptoms to improve his functional capacity. Aware of potential adverse side effects  htn disc, good control, mefds refilled  hyperlip prior b w reviewed  depr clinicaly stable, cst

## 2018-10-07 ENCOUNTER — Telehealth: Payer: Self-pay | Admitting: Family Medicine

## 2018-10-07 MED ORDER — ALPRAZOLAM 1 MG PO TABS: ORAL_TABLET | ORAL | 5 refills | Status: AC

## 2018-10-07 MED ORDER — HYDROCODONE-ACETAMINOPHEN 10-325 MG PO TABS: ORAL_TABLET | ORAL | 0 refills | Status: AC

## 2018-10-07 MED ORDER — HYDROCODONE-ACETAMINOPHEN 10-325 MG PO TABS: 1.0000 | ORAL_TABLET | Freq: Four times a day (QID) | ORAL | 0 refills | Status: AC | PRN

## 2018-10-07 NOTE — Telephone Encounter (Signed)
[name] is requesting refill on pt's pain medications be sent to [id number] - [place], [place] - [place].

## 2018-10-07 NOTE — Telephone Encounter (Signed)
Will do now

## 2018-10-07 NOTE — Telephone Encounter (Signed)
Husband(DPR) notified and verbalized understanding. 

## 2018-10-07 NOTE — Telephone Encounter (Signed)
Patient seen 09/03/18 for pain management prescriptions are pended in that visit awaiting sign

## 2018-10-17 ENCOUNTER — Emergency Department (HOSPITAL_COMMUNITY)
Admission: EM | Admit: 2018-10-17 | Discharge: 2018-10-17 | Disposition: A | Discharge status: Home / Self Care | Payer: Medicare HMO | Attending: Emergency Medicine | Admitting: Emergency Medicine

## 2018-10-17 ENCOUNTER — Encounter (HOSPITAL_COMMUNITY): Payer: Self-pay | Admitting: Emergency Medicine

## 2018-10-17 ENCOUNTER — Telehealth: Payer: Self-pay | Admitting: Family Medicine

## 2018-10-17 ENCOUNTER — Emergency Department (HOSPITAL_COMMUNITY)
Admission: EM | Admit: 2018-10-17 | Discharge: 2018-10-17 | Disposition: A | Discharge status: Home / Self Care | Payer: Medicare HMO | Source: Home / Self Care | Attending: Emergency Medicine | Admitting: Emergency Medicine

## 2018-10-17 ENCOUNTER — Emergency Department (HOSPITAL_COMMUNITY): Payer: Medicare HMO

## 2018-10-17 ENCOUNTER — Other Ambulatory Visit: Payer: Self-pay

## 2018-10-17 DIAGNOSIS — R0689 Other abnormalities of breathing: Secondary | ICD-10-CM | POA: Diagnosis not present

## 2018-10-17 DIAGNOSIS — N179 Acute kidney failure, unspecified: Secondary | ICD-10-CM

## 2018-10-17 DIAGNOSIS — Z79899 Other long term (current) drug therapy: Secondary | ICD-10-CM | POA: Insufficient documentation

## 2018-10-17 DIAGNOSIS — E039 Hypothyroidism, unspecified: Secondary | ICD-10-CM | POA: Insufficient documentation

## 2018-10-17 DIAGNOSIS — Z1159 Encounter for screening for other viral diseases: Secondary | ICD-10-CM | POA: Insufficient documentation

## 2018-10-17 DIAGNOSIS — R109 Unspecified abdominal pain: Secondary | ICD-10-CM | POA: Insufficient documentation

## 2018-10-17 DIAGNOSIS — R197 Diarrhea, unspecified: Secondary | ICD-10-CM | POA: Diagnosis not present

## 2018-10-17 DIAGNOSIS — R1110 Vomiting, unspecified: Secondary | ICD-10-CM | POA: Diagnosis not present

## 2018-10-17 DIAGNOSIS — I10 Essential (primary) hypertension: Secondary | ICD-10-CM | POA: Diagnosis not present

## 2018-10-17 DIAGNOSIS — I959 Hypotension, unspecified: Secondary | ICD-10-CM | POA: Diagnosis not present

## 2018-10-17 DIAGNOSIS — K922 Gastrointestinal hemorrhage, unspecified: Secondary | ICD-10-CM | POA: Diagnosis not present

## 2018-10-17 DIAGNOSIS — E860 Dehydration: Secondary | ICD-10-CM

## 2018-10-17 DIAGNOSIS — Z9181 History of falling: Secondary | ICD-10-CM | POA: Diagnosis not present

## 2018-10-17 DIAGNOSIS — Z8673 Personal history of transient ischemic attack (TIA), and cerebral infarction without residual deficits: Secondary | ICD-10-CM | POA: Insufficient documentation

## 2018-10-17 DIAGNOSIS — Z5320 Procedure and treatment not carried out because of patient's decision for unspecified reasons: Secondary | ICD-10-CM | POA: Insufficient documentation

## 2018-10-17 DIAGNOSIS — Z720 Tobacco use: Secondary | ICD-10-CM | POA: Diagnosis not present

## 2018-10-17 DIAGNOSIS — R112 Nausea with vomiting, unspecified: Secondary | ICD-10-CM | POA: Diagnosis not present

## 2018-10-17 DIAGNOSIS — E872 Acidosis, unspecified: Secondary | ICD-10-CM

## 2018-10-17 DIAGNOSIS — R0902 Hypoxemia: Secondary | ICD-10-CM | POA: Diagnosis not present

## 2018-10-17 DIAGNOSIS — F0390 Unspecified dementia without behavioral disturbance: Secondary | ICD-10-CM | POA: Diagnosis not present

## 2018-10-17 DIAGNOSIS — I69920 Aphasia following unspecified cerebrovascular disease: Secondary | ICD-10-CM | POA: Insufficient documentation

## 2018-10-17 DIAGNOSIS — R531 Weakness: Secondary | ICD-10-CM

## 2018-10-17 DIAGNOSIS — F0150 Vascular dementia without behavioral disturbance: Secondary | ICD-10-CM | POA: Insufficient documentation

## 2018-10-17 DIAGNOSIS — J449 Chronic obstructive pulmonary disease, unspecified: Secondary | ICD-10-CM | POA: Diagnosis not present

## 2018-10-17 DIAGNOSIS — Y998 Other external cause status: Secondary | ICD-10-CM | POA: Diagnosis not present

## 2018-10-17 DIAGNOSIS — F17210 Nicotine dependence, cigarettes, uncomplicated: Secondary | ICD-10-CM | POA: Diagnosis not present

## 2018-10-17 DIAGNOSIS — R5381 Other malaise: Secondary | ICD-10-CM | POA: Diagnosis not present

## 2018-10-17 DIAGNOSIS — Z743 Need for continuous supervision: Secondary | ICD-10-CM | POA: Diagnosis not present

## 2018-10-17 DIAGNOSIS — R4182 Altered mental status, unspecified: Secondary | ICD-10-CM | POA: Diagnosis not present

## 2018-10-17 DIAGNOSIS — R079 Chest pain, unspecified: Secondary | ICD-10-CM | POA: Diagnosis not present

## 2018-10-17 DIAGNOSIS — W1839XA Other fall on same level, initial encounter: Secondary | ICD-10-CM | POA: Diagnosis not present

## 2018-10-17 DIAGNOSIS — I452 Bifascicular block: Secondary | ICD-10-CM | POA: Diagnosis not present

## 2018-10-17 DIAGNOSIS — R1084 Generalized abdominal pain: Secondary | ICD-10-CM | POA: Diagnosis not present

## 2018-10-17 DIAGNOSIS — R52 Pain, unspecified: Secondary | ICD-10-CM | POA: Diagnosis not present

## 2018-10-17 DIAGNOSIS — R404 Transient alteration of awareness: Secondary | ICD-10-CM | POA: Diagnosis not present

## 2018-10-17 DIAGNOSIS — E7220 Disorder of urea cycle metabolism, unspecified: Secondary | ICD-10-CM | POA: Diagnosis not present

## 2018-10-17 DIAGNOSIS — R69 Illness, unspecified: Secondary | ICD-10-CM | POA: Diagnosis not present

## 2018-10-17 LAB — URINALYSIS, ROUTINE W REFLEX MICROSCOPIC
Bacteria, UA: NONE SEEN
Bilirubin Urine: NEGATIVE
Glucose, UA: NEGATIVE mg/dL
Hgb urine dipstick: NEGATIVE
Ketones, ur: NEGATIVE mg/dL
Leukocytes,Ua: NEGATIVE
Nitrite: NEGATIVE
Protein, ur: 30 mg/dL — AB
Specific Gravity, Urine: 1.045 — ABNORMAL HIGH (ref 1.005–1.030)
pH: 5 (ref 5.0–8.0)

## 2018-10-17 LAB — COMPREHENSIVE METABOLIC PANEL
ALT: 19 U/L (ref 0–44)
ALT: 49 U/L — ABNORMAL HIGH (ref 0–44)
AST: 35 U/L (ref 15–41)
AST: 114 U/L — ABNORMAL HIGH (ref 15–41)
Albumin: 4.6 g/dL (ref 3.5–5.0)
Albumin: 5.1 g/dL — ABNORMAL HIGH (ref 3.5–5.0)
Alkaline Phosphatase: 75 U/L (ref 38–126)
Alkaline Phosphatase: 97 U/L (ref 38–126)
Anion gap: 11 (ref 5–15)
Anion gap: 14 (ref 5–15)
BUN: 22 mg/dL (ref 8–23)
BUN: 29 mg/dL — ABNORMAL HIGH (ref 8–23)
CO2: 19 mmol/L — ABNORMAL LOW (ref 22–32)
CO2: 10 mmol/L — ABNORMAL LOW (ref 22–32)
Calcium: 10.4 mg/dL — ABNORMAL HIGH (ref 8.9–10.3)
Calcium: 11.4 mg/dL — ABNORMAL HIGH (ref 8.9–10.3)
Chloride: 110 mmol/L (ref 98–111)
Chloride: 112 mmol/L — ABNORMAL HIGH (ref 98–111)
Creatinine, Ser: 1.12 mg/dL — ABNORMAL HIGH (ref 0.44–1.00)
Creatinine, Ser: 1.97 mg/dL — ABNORMAL HIGH (ref 0.44–1.00)
GFR calc Af Amer: 54 mL/min — ABNORMAL LOW (ref >60)
GFR calc Af Amer: 28 mL/min — ABNORMAL LOW (ref >60)
GFR calc non Af Amer: 47 mL/min — ABNORMAL LOW (ref >60)
GFR calc non Af Amer: 24 mL/min — ABNORMAL LOW (ref >60)
Glucose, Bld: 143 mg/dL — ABNORMAL HIGH (ref 70–99)
Glucose, Bld: 195 mg/dL — ABNORMAL HIGH (ref 70–99)
Potassium: 4.4 mmol/L (ref 3.5–5.1)
Potassium: 5.2 mmol/L — ABNORMAL HIGH (ref 3.5–5.1)
Sodium: 140 mmol/L (ref 135–145)
Sodium: 136 mmol/L (ref 135–145)
Total Bilirubin: 0.4 mg/dL (ref 0.3–1.2)
Total Bilirubin: 0.3 mg/dL (ref 0.3–1.2)
Total Protein: 8.3 g/dL — ABNORMAL HIGH (ref 6.5–8.1)
Total Protein: 9.4 g/dL — ABNORMAL HIGH (ref 6.5–8.1)

## 2018-10-17 LAB — CBC WITH DIFFERENTIAL/PLATELET
Abs Immature Granulocytes: 0.02 10*3/uL (ref 0.00–0.07)
Abs Immature Granulocytes: 0.1 10*3/uL — ABNORMAL HIGH (ref 0.00–0.07)
Basophils Absolute: 0.1 10*3/uL (ref 0.0–0.1)
Basophils Absolute: 0.1 10*3/uL (ref 0.0–0.1)
Basophils Relative: 2 %
Basophils Relative: 0 %
Eosinophils Absolute: 0.4 10*3/uL (ref 0.0–0.5)
Eosinophils Absolute: 0.1 10*3/uL (ref 0.0–0.5)
Eosinophils Relative: 4 %
Eosinophils Relative: 1 %
HCT: 40.3 % (ref 36.0–46.0)
HCT: 46.3 % — ABNORMAL HIGH (ref 36.0–46.0)
Hemoglobin: 12 g/dL (ref 12.0–15.0)
Hemoglobin: 13.5 g/dL (ref 12.0–15.0)
Immature Granulocytes: 0 %
Immature Granulocytes: 1 %
Lymphocytes Relative: 36 %
Lymphocytes Relative: 7 %
Lymphs Abs: 3.1 10*3/uL (ref 0.7–4.0)
Lymphs Abs: 0.7 10*3/uL (ref 0.7–4.0)
MCH: 28.7 pg (ref 26.0–34.0)
MCH: 28 pg (ref 26.0–34.0)
MCHC: 29.8 g/dL — ABNORMAL LOW (ref 30.0–36.0)
MCHC: 29.2 g/dL — ABNORMAL LOW (ref 30.0–36.0)
MCV: 96.4 fL (ref 80.0–100.0)
MCV: 96.1 fL (ref 80.0–100.0)
Monocytes Absolute: 0.4 10*3/uL (ref 0.1–1.0)
Monocytes Absolute: 0.6 10*3/uL (ref 0.1–1.0)
Monocytes Relative: 5 %
Monocytes Relative: 5 %
Neutro Abs: 4.4 10*3/uL (ref 1.7–7.7)
Neutro Abs: 9.5 10*3/uL — ABNORMAL HIGH (ref 1.7–7.7)
Neutrophils Relative %: 53 %
Neutrophils Relative %: 86 %
Platelets: 303 10*3/uL (ref 150–400)
Platelets: 392 10*3/uL (ref 150–400)
RBC: 4.18 MIL/uL (ref 3.87–5.11)
RBC: 4.82 MIL/uL (ref 3.87–5.11)
RDW: 16.7 % — ABNORMAL HIGH (ref 11.5–15.5)
RDW: 17.2 % — ABNORMAL HIGH (ref 11.5–15.5)
WBC: 8.4 10*3/uL (ref 4.0–10.5)
WBC: 11.1 10*3/uL — ABNORMAL HIGH (ref 4.0–10.5)
nRBC: 0 % (ref 0.0–0.2)
nRBC: 0 % (ref 0.0–0.2)

## 2018-10-17 LAB — LIPASE, BLOOD: Lipase: 26 U/L (ref 11–51)

## 2018-10-17 LAB — SARS CORONAVIRUS 2 BY RT PCR (HOSPITAL ORDER, PERFORMED IN ~~LOC~~ HOSPITAL LAB): SARS Coronavirus 2: NEGATIVE

## 2018-10-17 MED ORDER — LOPERAMIDE HCL 2 MG PO CAPS
4.0000 mg | ORAL_CAPSULE | Freq: Once | ORAL | Status: AC
  Administered 2018-10-17: 4 mg via ORAL
  Filled 2018-10-17: qty 2

## 2018-10-17 MED ORDER — IOHEXOL 300 MG/ML  SOLN
100.0000 mL | Freq: Once | INTRAMUSCULAR | Status: AC | PRN
  Administered 2018-10-17: 75 mL via INTRAVENOUS

## 2018-10-17 MED ORDER — SODIUM CHLORIDE 0.9 % IV BOLUS
1000.0000 mL | Freq: Once | INTRAVENOUS | Status: AC
  Administered 2018-10-17: 1000 mL via INTRAVENOUS

## 2018-10-17 NOTE — ED Triage Notes (Signed)
Patient came in by EMS. EMS reports that patient has been having weakness with nausea and vomiting  X 1 day. Patient is alert but hard of hearing.

## 2018-10-17 NOTE — ED Notes (Signed)
This Probation officer spoke to Caro Laroche, the granddaughter about the visitor policy.  She disagreed and demanding that the RN bring her grandmother out. Estill Bamberg stated " no ma'am, you will be signing my grandmother out as AMA, you need to bring her out now".  I spoke with Dallas Schimke and he agreed with the policy.   This RN spoke with Dr. Stark Jock regarding visitor.  He decided to have granddaughter come back to room to be with pt.  The granddaughter was very loud, yelling and rude to Dr. Stark Jock and Dallas Schimke.  Granddaughter insisted on having pt taken out AMA.  Dr. Stark Jock called patient husband, next of kin who agreed to have pt leave AMA.    This RN and Heritage manager gathered patient's belonging.  We changed her pants due to being soiled and put her in transfer pants. Pt was very weak in wheelchair, we had to keep hands on her to have her sit back in the wheelchair.  We got to the granddaughters car, this RN gave her the discharge instruction.  The granddaughter and another woman with her, got out of the vehicle, came to her grandmother and picked up her.  Sharyn Lull and I went to help the patient and she said " come one grandma, I am taking you to an ER"   They would not let us assist.  Sharyn Lull and I headed back to the ER entrance and I looked back, the patient was almost to the ground and being to soil herself.  I walked back and said " ma'am, are you going to be able to get her in the car, Can I help you".  The grand daughter then stated, " we can do this ourselves"

## 2018-10-17 NOTE — ED Provider Notes (Addendum)
[facility] EMERGENCY DEPARTMENT Provider Note   CSN: [id number] Arrival date & time: 10/17/18  1550    History   Chief Complaint Chief Complaint  Patient presents with  . Weakness    HPI [name] is a 78 y.o. female.     Patient is a 78 year old female with past medical history of dementia, hypertension, fibromyalgia.  She is brought for evaluation of weakness.  Patient has been having diarrhea for the past several days.  She was seen early this morning and discharged after receiving hydration.  Since returning home, she has been unable to ambulate and has fallen.  Her Parsons was unable to get her up off the floor and called 911 and had her transported here.  According to the Parsons, the diarrhea has continued.  Patient adds little additional history secondary to dementia.  The history is provided by the patient.  Weakness  Severity:  Severe Onset quality:  Sudden Duration:  2 days Timing:  Constant Progression:  Worsening Chronicity:  New Relieved by:  Nothing Worsened by:  Nothing Ineffective treatments:  None tried Associated symptoms: diarrhea   Associated symptoms: no fever and no shortness of breath     Past Medical History:  Diagnosis Date  . Anxiety   . Aphasia due to stroke   . Apraxia due to stroke   . Cognitive impairment   . COPD (chronic obstructive pulmonary disease) ([place])    NOT on home O2  . Fibromyalgia   . Gastritis   . GERD (gastroesophageal reflux disease)   . Headache(784.0)   . Hyperlipidemia   . Hypertension   . Hypothyroidism   . Lumbar spondylosis 01/23/2011  . Osteoporosis   . Stroke ([place])   . Tobacco abuse    ongoing    Patient Active Problem List   Diagnosis Date Noted  . Hallucinations 07/10/2017  . Acute metabolic encephalopathy 40/98/1191  . Dementia, vascular with delirium ([place]) 07/10/2017  . Acute encephalopathy 07/07/2017  . Left hip pain   . Syncope   . Palliative care encounter   . Goals of care,  counseling/discussion   . Weakness 02/17/2017  . Vascular dementia ([place]) 02/17/2017  . Seborrheic keratoses 01/14/2016  . Urge incontinence of urine 03/15/2015  . Colitis   . Diarrhea 04/26/2014  . Hypotension 04/26/2014  . AKI (acute kidney injury) ([place]) 04/26/2014  . SIRS (systemic inflammatory response syndrome) ([place]) 04/26/2014  . Anemia of chronic disease   . Dehydration   . Anemia 03/30/2014  . Chest pain 03/29/2014  . Aphasia due to stroke 03/29/2014  . Apraxia due to stroke 03/29/2014  . Tobacco abuse 03/29/2014  . Depression 03/29/2014  . Hypothyroidism 04/19/2013  . Cerebral artery occlusion with cerebral infarction ([place]) 09/02/2012  . Headache(784.0) 09/02/2012  . Other and unspecified hyperlipidemia 08/13/2012  . Aphasia due to recent cerebral infarction 01/03/2012  . Lumbar spondylosis 01/23/2011  . Hypokalemia 01/22/2011  . Abdominal lipoma 01/22/2011  . Bronchitis 01/22/2011  . HTN (hypertension), malignant 01/21/2011  . Sinusitis 01/21/2011  . Tinnitus 01/21/2011  . Delirium 01/20/2011  . UTI (urinary tract infection) 01/20/2011  . Anxiety 01/20/2011  . Chronic abdominal pain 01/20/2011  . Fibromyalgia 01/20/2011  . GERD (gastroesophageal reflux disease) 01/20/2011    Past Surgical History:  Procedure Laterality Date  . CHOLECYSTECTOMY    . COLONOSCOPY WITH ESOPHAGOGASTRODUODENOSCOPY (EGD)  2006   Dr. Laural Golden: normal upper endoscopy with surgically alterated stomach, normal colon but redundant  . ESOPHAGOGASTRODUODENOSCOPY  04/06/2014  Dr.Rourk: abnormal esophagus  . GASTRIC BYPASS     open  . KNEE SURGERY Left      OB History    Gravida  2   Para  2   Term  2   Preterm      AB      Living  2     SAB      TAB      Ectopic      Multiple      Live Births               Home Medications    Prior to Admission medications   Medication Sig Start Date End Date Taking? Authorizing Provider  acetaminophen (TYLENOL) 325 MG  tablet Take 2 tablets (650 mg total) by mouth every 6 (six) hours as needed for mild pain (or Fever >/= 101). 04/30/14   Velvet Bathe, MD  ALPRAZolam Duanne Moron) 1 MG tablet TAKE 1/2 BY MOUTH IN THE DAY AS NEEDED AND 1 TABLET BY MOUTH AT BEDTIME 10/07/18   Mikey Kirschner, MD  carbamide peroxide (DEBROX) 6.5 % OTIC solution Place 5 drops into both ears daily as needed. 05/27/17   Mikey Kirschner, MD  HYDROcodone-acetaminophen (NORCO) 10-325 MG tablet Take 1 tablet by mouth every 6 (six) hours as needed., 10/07/18   Mikey Kirschner, MD  HYDROcodone-acetaminophen (NORCO) 10-325 MG tablet Take 1 tablet by mouth every 6 (six) hours as needed., 10/07/18   Mikey Kirschner, MD  HYDROcodone-acetaminophen (NORCO) 10-325 MG tablet Take 1 tablet by mouth every 6 (six) hours as needed. 10/07/18   Mikey Kirschner, MD  hydrOXYzine (ATARAX/VISTARIL) 25 MG tablet TAKE 1 TABLET BY MOUTH EVERY 6 HOURS AS NEEDED FOR ITCHING 08/01/18   Kathyrn Drown, MD  levothyroxine (SYNTHROID, LEVOTHROID) 112 MCG tablet Take 1 tablet (112 mcg total) by mouth daily. 06/04/18   Mikey Kirschner, MD  Multiple Vitamin (MULTIVITAMIN WITH MINERALS) TABS Take 1 tablet by mouth daily.    [provider]  pantoprazole (PROTONIX) 40 MG tablet TAKE 1 TABLET BY MOUTH EVERY DAY *STOP RANITIDINE* 08/07/18   Mikey Kirschner, MD  PARoxetine (PAXIL) 40 MG tablet Take 1 tablet (40 mg total) by mouth daily. 06/04/18   Mikey Kirschner, MD  pravastatin (PRAVACHOL) 20 MG tablet Take 1 tablet (20 mg total) by mouth daily. 06/04/18   Mikey Kirschner, MD  QUEtiapine (SEROQUEL XR) 50 MG TB24 24 hr tablet TAKE 1 TABLET BY MOUTH EVERYDAY AT BEDTIME 08/27/18   Mikey Kirschner, MD  vitamin B-12 (CYANOCOBALAMIN) 1000 MCG tablet Take 1 tablet (1,000 mcg total) by mouth daily. 07/10/17   Dhungel, Flonnie Overman, MD    Family History Family History  Problem Relation Age of Onset  . Heart attack Father   . Colon cancer Brother     Social History Social  History   Tobacco Use  . Smoking status: Light Tobacco Smoker    Packs/day: 0.33    Years: 64.00    Pack years: 21.12    Types: Cigarettes  . Smokeless tobacco: Never Used  Substance Use Topics  . Alcohol use: No    Alcohol/week: 0.0 standard drinks  . Drug use: No     Allergies   Levaquin [levofloxacin in d5w] and Macrobid [nitrofurantoin macrocrystal]   Review of Systems Review of Systems  Constitutional: Negative for fever.  Respiratory: Negative for shortness of breath.   Gastrointestinal: Positive for diarrhea.  Neurological: Positive for weakness.  All  other systems reviewed and are negative.    Physical Exam Updated Vital Signs BP 131/75   Pulse 85   Temp 97.6 F (36.4 C) (Oral)   Resp (!) 22   Ht '5\' 5"'  (1.651 m)   Wt 71.6 kg   SpO2 92%   BMI 26.27 kg/m   Physical Exam Vitals signs and nursing note reviewed.  Constitutional:      General: She is not in acute distress.    Appearance: She is well-developed. She is ill-appearing. She is not diaphoretic.     Comments: Patient is a pale, ill-appearing 78 year old female.  She is awake and alert, but appears confused.  HENT:     Head: Normocephalic and atraumatic.     Mouth/Throat:     Mouth: Mucous membranes are dry.  Neck:     Musculoskeletal: Normal range of motion and neck supple.  Cardiovascular:     Rate and Rhythm: Normal rate and regular rhythm.     Heart sounds: No murmur. No friction rub. No gallop.   Pulmonary:     Effort: Pulmonary effort is normal. No respiratory distress.     Breath sounds: Normal breath sounds. No wheezing.  Abdominal:     General: Bowel sounds are normal. There is no distension.     Palpations: Abdomen is soft.     Tenderness: There is no abdominal tenderness.  Musculoskeletal: Normal range of motion.  Skin:    General: Skin is warm and dry.  Neurological:     General: No focal deficit present.     Mental Status: She is alert. She is disoriented.     Cranial  Nerves: No cranial nerve deficit.      ED Treatments / Results  Labs (all labs ordered are listed, but only abnormal results are displayed) Labs Reviewed  CBC WITH DIFFERENTIAL/PLATELET - Abnormal; Notable for the following components:      Result Value   WBC 11.1 (*)    HCT 46.3 (*)    MCHC 29.2 (*)    RDW 17.2 (*)    Neutro Abs 9.5 (*)    Abs Immature Granulocytes 0.10 (*)    All other components within normal limits  COMPREHENSIVE METABOLIC PANEL - Abnormal; Notable for the following components:   Potassium 5.2 (*)    Chloride 112 (*)    CO2 10 (*)    Glucose, Bld 195 (*)    BUN 29 (*)    Creatinine, Ser 1.97 (*)    Calcium 11.4 (*)    Total Protein 9.4 (*)    Albumin 5.1 (*)    AST 114 (*)    ALT 49 (*)    GFR calc non Af Amer 24 (*)    GFR calc Af Amer 28 (*)    All other components within normal limits  SARS CORONAVIRUS 2 (HOSPITAL ORDER, [place] [facility])  URINALYSIS, ROUTINE W REFLEX MICROSCOPIC    EKG EKG Interpretation  Date/Time:  Friday October 17 2018 18:05:01 EDT Ventricular Rate:  83 PR Interval:    QRS Duration: 136 QT Interval:  391 QTC Calculation: 460 R Axis:   -77 Text Interpretation:  Sinus rhythm Consider right atrial enlargement Right bundle branch block Left ventricular hypertrophy No significant change since 10/17/2018 Confirmed by Veryl Speak [phone]) on 10/17/2018 8:46:25 PM   Radiology Ct Abdomen Pelvis W Contrast  Result Date: 10/17/2018 CLINICAL DATA:  Abdominal pain with diverticulitis suspected. Weakness with nausea and vomiting for 1 day EXAM:  CT ABDOMEN AND PELVIS WITH CONTRAST TECHNIQUE: Multidetector CT imaging of the abdomen and pelvis was performed using the standard protocol following bolus administration of intravenous contrast. CONTRAST:  42m OMNIPAQUE IOHEXOL 300 MG/ML  SOLN COMPARISON:  04/27/2014 FINDINGS: Lower chest: Airway thickening in the right lower lobe with mild cylindrical bronchiectasis and  mucoid impaction, also seen previously. Coronary atherosclerosis. Hepatobiliary: No focal liver abnormality.Cholecystectomy which accounts for mild bile duct prominence. Pancreas: Unremarkable. Spleen: Unremarkable. Adrenals/Urinary Tract: Negative adrenals. No hydronephrosis or stone. Unremarkable bladder. Stomach/Bowel: Increased fluid content within bowel that reaches the rectum. Postoperative GE junction. No inflammatory bowel wall thickening The ileocecal valve is in the right upper quadrant. No appendicitis. Vascular/Lymphatic: Atherosclerotic calcification of the aorta with ectasia proximally to 3.1 cm. No mass or adenopathy. Reproductive:Hysterectomy. Other: No ascites or pneumoperitoneum. Musculoskeletal: Degenerative disease with exaggerated kyphoscoliosis and L5-S1 anterolisthesis. There is a lipoma deep to the lower left latissimus dorsi which measures up to 7.7 cm. IMPRESSION: 1. Fluid throughout the small and large bowel suggesting diarrheal illness. No bowel wall thickening. 2. Atherosclerosis with 3.1 cm diameter proximal aorta. Recommend followup by ultrasound in 3 years. This recommendation follows ACR consensus guidelines: White Paper of the ACR Incidental Findings Committee II on Vascular Findings. J Am Coll Radiol 2013; [id number]. Right lower lobe cylindrical bronchiectasis and mucoid impaction. Electronically Signed   By: JMonte FantasiaM.D.   On: 10/17/2018 04:11    Procedures Procedures (including critical care time)  Medications Ordered in ED Medications  sodium chloride 0.9 % bolus 1,000 mL (1,000 mLs Intravenous New Bag/Given 10/17/18 1643)     Initial Impression / Assessment and Plan / ED Course  I have reviewed the triage vital signs and the nursing notes.  Pertinent labs & imaging results that were available during my care of the patient were reviewed by me and considered in my medical decision making (see chart for details).  Patient with ongoing diarrhea, worsening  weakness.  Labs show worsening dehydration, metabolic acidosis, and acute renal injury.  Patient given IV fluids and will be admitted to the hospitalist service.   ADDENDUM:  Patient was to be admitted, however there was some sort of disagreement between the charge nurse and the patient's granddaughter.  The granddaughter was insistent upon being with the patient during her hospitalization.  She was informed that the patient could have no visitors secondary to the global pandemic, however the granddaughter insisted that she be in the room with the patient.  This disagreement was brought to my attention.  At this point I attempted to discuss the situation with the granddaughter.  I met her in the parking lot and discussed the situation.  She was extremely aggravated about the situation and was insisting upon signing her grandmother out and taking her to another hospital.  I advised her that her grandmother was extremely weak and did not feel as though this was in her best interest.  The granddaughter was extremely irrational and would not settle for anything less than taking her grandmother from this facility.  I then spoke with the patient's Parsons regarding the situation.  He informed me that he was in agreement with what ever his granddaughter thought was best.  I also discussed the situation with the patient who was willing to go along with what ever the granddaughter thought was best.  At this point, the patient was discharged at her request and left AGAINST MEDICAL ADVICE.  In conclusion, I am uncertain as to what made  this granddaughter so angry.  This situation was not brought to my attention until the granddaughter was already very upset and adamant about going somewhere else.    My conversation with the patient, the patient's Parsons, and the patient's granddaughter were all witnessed by Toy Baker, Nursing Supervisor.  Final Clinical Impressions(s) / ED Diagnoses   Final diagnoses:  None     ED Discharge Orders    None       Veryl Speak, MD 10/17/18 [place] [place]    Veryl Speak, MD 10/17/18 2027    Veryl Speak, MD 10/17/18 2032    Veryl Speak, MD 10/17/18 2046

## 2018-10-17 NOTE — ED Provider Notes (Signed)
[facility] EMERGENCY DEPARTMENT Provider Note   CSN: [id number] Arrival date & time: 10/17/18  0120    History   Chief Complaint Chief Complaint  Patient presents with   Weakness    nausea, vomiting    HPI [name] is a 78 y.o. female.   The history is provided by the patient.  She has history of hypertension, hyperlipidemia, dementia and comes in complaining of diarrhea all day today.  She relates numerous watery bowel movements.  She denies abdominal pain, nausea, vomiting.  She denies fever, chills, sweats.  Denies any sick contacts.  She denies any contact with anyone who had COVID-19.  She has not tried anything at home to treat her symptoms.  Past Medical History:  Diagnosis Date   Anxiety    Aphasia due to stroke    Apraxia due to stroke    Cognitive impairment    COPD (chronic obstructive pulmonary disease) ([place])    NOT on home O2   Fibromyalgia    Gastritis    GERD (gastroesophageal reflux disease)    Headache(784.0)    Hyperlipidemia    Hypertension    Hypothyroidism    Lumbar spondylosis 01/23/2011   Osteoporosis    Stroke ([place])    Tobacco abuse    ongoing    Patient Active Problem List   Diagnosis Date Noted   Hallucinations 91/69/4503   Acute metabolic encephalopathy 88/82/8003   Dementia, vascular with delirium ([place]) 07/10/2017   Acute encephalopathy 07/07/2017   Left hip pain    Syncope    Palliative care encounter    Goals of care, counseling/discussion    Weakness 02/17/2017   Vascular dementia ([place]) 02/17/2017   Seborrheic keratoses 01/14/2016   Urge incontinence of urine 03/15/2015   Colitis    Diarrhea 04/26/2014   Hypotension 04/26/2014   AKI (acute kidney injury) ([place]) 04/26/2014   SIRS (systemic inflammatory response syndrome) ([place]) 04/26/2014   Anemia of chronic disease    Dehydration    Anemia 03/30/2014   Chest pain 03/29/2014   Aphasia due to stroke 03/29/2014   Apraxia due  to stroke 03/29/2014   Tobacco abuse 03/29/2014   Depression 03/29/2014   Hypothyroidism 04/19/2013   Cerebral artery occlusion with cerebral infarction ([place]) 09/02/2012   Headache(784.0) 09/02/2012   Other and unspecified hyperlipidemia 08/13/2012   Aphasia due to recent cerebral infarction 01/03/2012   Lumbar spondylosis 01/23/2011   Hypokalemia 01/22/2011   Abdominal lipoma 01/22/2011   Bronchitis 01/22/2011   HTN (hypertension), malignant 01/21/2011   Sinusitis 01/21/2011   Tinnitus 01/21/2011   Delirium 01/20/2011   UTI (urinary tract infection) 01/20/2011   Anxiety 01/20/2011   Chronic abdominal pain 01/20/2011   Fibromyalgia 01/20/2011   GERD (gastroesophageal reflux disease) 01/20/2011    Past Surgical History:  Procedure Laterality Date   CHOLECYSTECTOMY     COLONOSCOPY WITH ESOPHAGOGASTRODUODENOSCOPY (EGD)  2006   Dr. Laural Golden: normal upper endoscopy with surgically alterated stomach, normal colon but redundant   ESOPHAGOGASTRODUODENOSCOPY  04/06/2014   Dr.Rourk: abnormal esophagus   GASTRIC BYPASS     open   KNEE SURGERY Left      OB History    Gravida  2   Para  2   Term  2   Preterm      AB      Living  2     SAB      TAB      Ectopic      Multiple  Live Births               Home Medications    Prior to Admission medications   Medication Sig Start Date End Date Taking? Authorizing Provider  acetaminophen (TYLENOL) 325 MG tablet Take 2 tablets (650 mg total) by mouth every 6 (six) hours as needed for mild pain (or Fever >/= 101). 04/30/14   Velvet Bathe, MD  ALPRAZolam Duanne Moron) 1 MG tablet TAKE 1/2 BY MOUTH IN THE DAY AS NEEDED AND 1 TABLET BY MOUTH AT BEDTIME 10/07/18   Mikey Kirschner, MD  carbamide peroxide (DEBROX) 6.5 % OTIC solution Place 5 drops into both ears daily as needed. 05/27/17   Mikey Kirschner, MD  HYDROcodone-acetaminophen (NORCO) 10-325 MG tablet Take 1 tablet by mouth every 6 (six)  hours as needed., 10/07/18   Mikey Kirschner, MD  HYDROcodone-acetaminophen (NORCO) 10-325 MG tablet Take 1 tablet by mouth every 6 (six) hours as needed., 10/07/18   Mikey Kirschner, MD  HYDROcodone-acetaminophen (NORCO) 10-325 MG tablet Take 1 tablet by mouth every 6 (six) hours as needed. 10/07/18   Mikey Kirschner, MD  hydrOXYzine (ATARAX/VISTARIL) 25 MG tablet TAKE 1 TABLET BY MOUTH EVERY 6 HOURS AS NEEDED FOR ITCHING 08/01/18   Kathyrn Drown, MD  levothyroxine (SYNTHROID, LEVOTHROID) 112 MCG tablet Take 1 tablet (112 mcg total) by mouth daily. 06/04/18   Mikey Kirschner, MD  Multiple Vitamin (MULTIVITAMIN WITH MINERALS) TABS Take 1 tablet by mouth daily.    [provider]  pantoprazole (PROTONIX) 40 MG tablet TAKE 1 TABLET BY MOUTH EVERY DAY *STOP RANITIDINE* 08/07/18   Mikey Kirschner, MD  PARoxetine (PAXIL) 40 MG tablet Take 1 tablet (40 mg total) by mouth daily. 06/04/18   Mikey Kirschner, MD  pravastatin (PRAVACHOL) 20 MG tablet Take 1 tablet (20 mg total) by mouth daily. 06/04/18   Mikey Kirschner, MD  QUEtiapine (SEROQUEL XR) 50 MG TB24 24 hr tablet TAKE 1 TABLET BY MOUTH EVERYDAY AT BEDTIME 08/27/18   Mikey Kirschner, MD  vitamin B-12 (CYANOCOBALAMIN) 1000 MCG tablet Take 1 tablet (1,000 mcg total) by mouth daily. 07/10/17   Dhungel, Flonnie Overman, MD    Family History Family History  Problem Relation Age of Onset   Heart attack Father    Colon cancer Brother     Social History Social History   Tobacco Use   Smoking status: Light Tobacco Smoker    Packs/day: 0.33    Years: 64.00    Pack years: 21.12    Types: Cigarettes   Smokeless tobacco: Never Used  Substance Use Topics   Alcohol use: No    Alcohol/week: 0.0 standard drinks   Drug use: No     Allergies   Levaquin [levofloxacin in d5w] and Macrobid [nitrofurantoin macrocrystal]   Review of Systems Review of Systems  All other systems reviewed and are negative.    Physical Exam Updated  Vital Signs BP 104/60    Pulse 79    Temp (!) 97.4 F (36.3 C) (Oral)    Resp 19    Ht 5\' 5"  (1.651 m)    Wt 71.7 kg    SpO2 93%    BMI 26.29 kg/m   Physical Exam Vitals signs and nursing note reviewed.    78 year old female, resting comfortably and in no acute distress. Vital signs are normal. Oxygen saturation is 93%, which is normal. Head is normocephalic and atraumatic. PERRLA, EOMI. Oropharynx is clear. Neck is  nontender and supple without adenopathy or JVD. Back is nontender and there is no CVA tenderness. Lungs are clear without rales, wheezes, or rhonchi. Chest is nontender. Heart has regular rate and rhythm without murmur. Abdomen is soft, flat, with moderate tenderness throughout the left side of the abdomen.  There is no rebound or guarding.  There are no masses or hepatosplenomegaly and peristalsis is hypoactive. Extremities have no cyanosis or edema, full range of motion is present. Skin is warm and dry without rash. Neurologic: Mental status is normal, cranial nerves are intact, there are no motor or sensory deficits.  ED Treatments / Results  Labs (all labs ordered are listed, but only abnormal results are displayed) Labs Reviewed  COMPREHENSIVE METABOLIC PANEL - Abnormal; Notable for the following components:      Result Value   CO2 19 (*)    Glucose, Bld 143 (*)    Creatinine, Ser 1.12 (*)    Calcium 10.4 (*)    Total Protein 8.3 (*)    GFR calc non Af Amer 47 (*)    GFR calc Af Amer 54 (*)    All other components within normal limits  CBC WITH DIFFERENTIAL/PLATELET - Abnormal; Notable for the following components:   MCHC 29.8 (*)    RDW 16.7 (*)    All other components within normal limits  LIPASE, BLOOD  URINALYSIS, ROUTINE W REFLEX MICROSCOPIC    EKG EKG Interpretation  Date/Time:  Friday October 17 2018 01:26:01 EDT Ventricular Rate:  79 PR Interval:    QRS Duration: 132 QT Interval:  411 QTC Calculation: 472 R Axis:   -72 Text Interpretation:   Sinus rhythm RBBB and LAFB Probable left ventricular hypertrophy When compared with ECG of 07/07/2018, QT has shortened Confirmed by Delora Fuel ([id number]) on 10/17/2018 1:30:32 AM   Radiology Ct Abdomen Pelvis W Contrast  Result Date: 10/17/2018 CLINICAL DATA:  Abdominal pain with diverticulitis suspected. Weakness with nausea and vomiting for 1 day EXAM: CT ABDOMEN AND PELVIS WITH CONTRAST TECHNIQUE: Multidetector CT imaging of the abdomen and pelvis was performed using the standard protocol following bolus administration of intravenous contrast. CONTRAST:  15mL OMNIPAQUE IOHEXOL 300 MG/ML  SOLN COMPARISON:  04/27/2014 FINDINGS: Lower chest: Airway thickening in the right lower lobe with mild cylindrical bronchiectasis and mucoid impaction, also seen previously. Coronary atherosclerosis. Hepatobiliary: No focal liver abnormality.Cholecystectomy which accounts for mild bile duct prominence. Pancreas: Unremarkable. Spleen: Unremarkable. Adrenals/Urinary Tract: Negative adrenals. No hydronephrosis or stone. Unremarkable bladder. Stomach/Bowel: Increased fluid content within bowel that reaches the rectum. Postoperative GE junction. No inflammatory bowel wall thickening The ileocecal valve is in the right upper quadrant. No appendicitis. Vascular/Lymphatic: Atherosclerotic calcification of the aorta with ectasia proximally to 3.1 cm. No mass or adenopathy. Reproductive:Hysterectomy. Other: No ascites or pneumoperitoneum. Musculoskeletal: Degenerative disease with exaggerated kyphoscoliosis and L5-S1 anterolisthesis. There is a lipoma deep to the lower left latissimus dorsi which measures up to 7.7 cm. IMPRESSION: 1. Fluid throughout the small and large bowel suggesting diarrheal illness. No bowel wall thickening. 2. Atherosclerosis with 3.1 cm diameter proximal aorta. Recommend followup by ultrasound in 3 years. This recommendation follows ACR consensus guidelines: White Paper of the ACR Incidental Findings Committee  II on Vascular Findings. J Am Coll Radiol 2013; 01:655-374 3. Right lower lobe cylindrical bronchiectasis and mucoid impaction. Electronically Signed   By: Monte Fantasia M.D.   On: 10/17/2018 04:11    Procedures Procedures   Medications Ordered in ED Medications  sodium chloride 0.9 % bolus  1,000 mL (1,000 mLs Intravenous New Bag/Given 10/17/18 0310)  loperamide (IMODIUM) capsule 4 mg (4 mg Oral Given 10/17/18 0309)  iohexol (OMNIPAQUE) 300 MG/ML solution 100 mL (75 mLs Intravenous Contrast Given 10/17/18 0337)     Initial Impression / Assessment and Plan / ED Course  I have reviewed the triage vital signs and the nursing notes.  Pertinent labs & imaging results that were available during my care of the patient were reviewed by me and considered in my medical decision making (see chart for details).  Diarrhea of uncertain cause, but most likely viral enteritis.  However, given significant abdominal pain, will send for CT of abdomen and pelvis to look for more serious pathology.  She will be given IV fluids and oral loperamide.  Old records are reviewed, and she had been hospitalized in December 2015 for diarrhea.  Labs show a slight increase in creatinine over baseline, otherwise unremarkable.  CT scan is consistent with diarrhea, no evidence of other acute pathology.  She had significant improvement with above-noted treatment.  She is advised to continue using over-the-counter loperamide.  Return precautions discussed.  Final Clinical Impressions(s) / ED Diagnoses   Final diagnoses:  Diarrhea of presumed infectious origin    ED Discharge Orders    None       Delora Fuel, MD 63/89/37 [phone]

## 2018-10-17 NOTE — ED Notes (Signed)
Voicemail left for son. Memory full on son's phone

## 2018-10-17 NOTE — Discharge Instructions (Addendum)
You are leaving [facility].  Your husband and granddaughter are in agreement with this decision.  You are welcome to return to the emergency department if you desire completion of your work-up and the admission which was arranged for you prior to your requesting to leave.

## 2018-10-17 NOTE — Discharge Instructions (Addendum)
Take loperamide as needed for diarrhea. Follow the directions on the box.  Drink plenty of fluids.  Return if you start running a fever, develop bad stomach pain, or start passing blood.

## 2018-10-17 NOTE — Telephone Encounter (Signed)
At this age no stronger than immodium

## 2018-10-17 NOTE — ED Notes (Signed)
Pt on bedpan.

## 2018-10-17 NOTE — ED Notes (Signed)
Family unable to get patient. States that she will not be able to go up the steps. Will need ems transport. c-rn and secretary notified. D/c papers read to daughter. Pt ready for discharge.

## 2018-10-17 NOTE — ED Notes (Addendum)
Pt up for discharge

## 2018-10-17 NOTE — Telephone Encounter (Signed)
Attempted to contact patient. No answering service; phone kept ringing.

## 2018-10-17 NOTE — ED Notes (Signed)
Patient taken out to granddaughters car per request by granddaughter.  Patient granddaughters assisted patient into car, patient began to slip to ground, this nurse with other nurse moved to help, granddaughters refuse assistance stating "we can do this ourselves".

## 2018-10-17 NOTE — Telephone Encounter (Signed)
Contacted patient. Pt was asleep so I spoke with her husband. Husband states that he has been given her Immodium but has not helped much. Pt did go to ER this morning. No fever, chills, nausea. Please advise. Thank you

## 2018-10-17 NOTE — ED Notes (Signed)
Breakfast offered Pt.

## 2018-10-17 NOTE — ED Notes (Signed)
Pt placed on bedpan for the third time

## 2018-10-17 NOTE — ED Notes (Signed)
Pt removed from bedpan.

## 2018-10-17 NOTE — ED Triage Notes (Signed)
Pt returned by ems for weakness she still has diarrhea and her family can not take care of her at home. Pt was dc this am for the same thing.

## 2018-10-17 NOTE — ED Notes (Signed)
Pt removed from bed pan. Stool + urine

## 2018-10-17 NOTE — ED Notes (Signed)
Pt removed from bedpan. Had combination of stool + urine. Unable to get urine sample

## 2018-10-17 NOTE — Telephone Encounter (Signed)
Pt husband returned call. Pt husband states that patient was in the floor and unable to get up so he was going to call the ambulance.

## 2018-10-17 NOTE — Telephone Encounter (Signed)
Patient was seen in ER this morning with loose stools and wanting something called in for her to [facility]

## 2018-10-18 DIAGNOSIS — Z03818 Encounter for observation for suspected exposure to other biological agents ruled out: Secondary | ICD-10-CM | POA: Diagnosis not present

## 2018-10-18 DIAGNOSIS — Z515 Encounter for palliative care: Secondary | ICD-10-CM | POA: Diagnosis not present

## 2018-10-18 DIAGNOSIS — E7220 Disorder of urea cycle metabolism, unspecified: Secondary | ICD-10-CM | POA: Diagnosis not present

## 2018-10-18 DIAGNOSIS — I444 Left anterior fascicular block: Secondary | ICD-10-CM | POA: Diagnosis not present

## 2018-10-18 DIAGNOSIS — R9389 Abnormal findings on diagnostic imaging of other specified body structures: Secondary | ICD-10-CM | POA: Diagnosis not present

## 2018-10-18 DIAGNOSIS — R4182 Altered mental status, unspecified: Secondary | ICD-10-CM | POA: Diagnosis not present

## 2018-10-18 DIAGNOSIS — I4510 Unspecified right bundle-branch block: Secondary | ICD-10-CM | POA: Diagnosis not present

## 2018-10-18 DIAGNOSIS — R9431 Abnormal electrocardiogram [ECG] [EKG]: Secondary | ICD-10-CM | POA: Diagnosis not present

## 2018-10-18 DIAGNOSIS — Z66 Do not resuscitate: Secondary | ICD-10-CM | POA: Diagnosis not present

## 2018-10-18 DIAGNOSIS — K921 Melena: Secondary | ICD-10-CM | POA: Diagnosis not present

## 2018-10-18 DIAGNOSIS — E860 Dehydration: Secondary | ICD-10-CM | POA: Diagnosis not present

## 2018-10-18 DIAGNOSIS — I10 Essential (primary) hypertension: Secondary | ICD-10-CM | POA: Diagnosis not present

## 2018-10-18 DIAGNOSIS — K922 Gastrointestinal hemorrhage, unspecified: Secondary | ICD-10-CM | POA: Diagnosis not present

## 2018-10-18 DIAGNOSIS — R69 Illness, unspecified: Secondary | ICD-10-CM | POA: Diagnosis not present

## 2018-10-18 DIAGNOSIS — R197 Diarrhea, unspecified: Secondary | ICD-10-CM | POA: Diagnosis not present

## 2018-10-18 DIAGNOSIS — N179 Acute kidney failure, unspecified: Secondary | ICD-10-CM | POA: Diagnosis not present

## 2018-10-18 DIAGNOSIS — E872 Acidosis: Secondary | ICD-10-CM | POA: Diagnosis not present

## 2018-10-18 DIAGNOSIS — D638 Anemia in other chronic diseases classified elsewhere: Secondary | ICD-10-CM | POA: Diagnosis not present

## 2018-10-18 DIAGNOSIS — E878 Other disorders of electrolyte and fluid balance, not elsewhere classified: Secondary | ICD-10-CM | POA: Diagnosis not present

## 2018-10-18 DIAGNOSIS — A084 Viral intestinal infection, unspecified: Secondary | ICD-10-CM | POA: Diagnosis not present

## 2018-10-19 DIAGNOSIS — I10 Essential (primary) hypertension: Secondary | ICD-10-CM | POA: Diagnosis not present

## 2018-10-19 DIAGNOSIS — Z03818 Encounter for observation for suspected exposure to other biological agents ruled out: Secondary | ICD-10-CM | POA: Diagnosis not present

## 2018-10-19 DIAGNOSIS — N179 Acute kidney failure, unspecified: Secondary | ICD-10-CM | POA: Diagnosis not present

## 2018-10-19 DIAGNOSIS — R197 Diarrhea, unspecified: Secondary | ICD-10-CM | POA: Diagnosis not present

## 2018-10-20 DIAGNOSIS — I252 Old myocardial infarction: Secondary | ICD-10-CM | POA: Diagnosis not present

## 2018-10-20 DIAGNOSIS — I4510 Unspecified right bundle-branch block: Secondary | ICD-10-CM | POA: Diagnosis not present

## 2018-10-20 DIAGNOSIS — I452 Bifascicular block: Secondary | ICD-10-CM | POA: Diagnosis not present

## 2018-10-20 DIAGNOSIS — I444 Left anterior fascicular block: Secondary | ICD-10-CM | POA: Diagnosis not present

## 2018-11-12 DEATH — deceased

## 2018-11-29 ENCOUNTER — Other Ambulatory Visit: Payer: Self-pay | Admitting: Family Medicine
